# Patient Record
Sex: Male | Born: 1955
Health system: Southern US, Community
[De-identification: ages and names within clinical notes are randomized; demographics above are authoritative.]

## PROBLEM LIST (undated history)

## (undated) DIAGNOSIS — E785 Hyperlipidemia, unspecified: Secondary | ICD-10-CM

## (undated) DIAGNOSIS — J189 Pneumonia, unspecified organism: Secondary | ICD-10-CM

## (undated) DIAGNOSIS — G4730 Sleep apnea, unspecified: Secondary | ICD-10-CM

## (undated) DIAGNOSIS — F32A Depression, unspecified: Secondary | ICD-10-CM

## (undated) DIAGNOSIS — L4050 Arthropathic psoriasis, unspecified: Secondary | ICD-10-CM

## (undated) DIAGNOSIS — I10 Essential (primary) hypertension: Secondary | ICD-10-CM

## (undated) DIAGNOSIS — Z87442 Personal history of urinary calculi: Secondary | ICD-10-CM

## (undated) DIAGNOSIS — R413 Other amnesia: Secondary | ICD-10-CM

## (undated) DIAGNOSIS — F329 Major depressive disorder, single episode, unspecified: Secondary | ICD-10-CM

## (undated) HISTORY — DX: Major depressive disorder, single episode, unspecified: F32.9

## (undated) HISTORY — DX: Depression, unspecified: F32.A

## (undated) HISTORY — PX: SPINAL CORD STIMULATOR IMPLANT: SHX2422

## (undated) HISTORY — DX: Other amnesia: R41.3

## (undated) HISTORY — PX: TONSILLECTOMY: SUR1361

---

## 2006-02-28 ENCOUNTER — Ambulatory Visit (HOSPITAL_COMMUNITY): Admission: RE | Admit: 2006-02-28 | Discharge: 2006-02-28 | Payer: Self-pay | Admitting: *Deleted

## 2012-02-19 ENCOUNTER — Ambulatory Visit (INDEPENDENT_AMBULATORY_CARE_PROVIDER_SITE_OTHER): Payer: 59 | Admitting: Family Medicine

## 2012-02-19 VITALS — BP 130/88 | HR 70 | Temp 97.9°F | Resp 20 | Ht 71.0 in | Wt 258.0 lb

## 2012-02-19 DIAGNOSIS — H8110 Benign paroxysmal vertigo, unspecified ear: Secondary | ICD-10-CM

## 2012-02-19 MED ORDER — PROMETHAZINE HCL 25 MG PO TABS: 25.0000 mg | ORAL_TABLET | Freq: Three times a day (TID) | ORAL | Status: DC | PRN

## 2012-02-19 MED ORDER — PROMETHAZINE HCL 25 MG/ML IJ SOLN
25.0000 mg | Freq: Four times a day (QID) | INTRAMUSCULAR | Status: DC | PRN
  Administered 2012-02-19: 25 mg via INTRAMUSCULAR

## 2012-02-19 MED ORDER — MECLIZINE HCL 25 MG PO TABS: 25.0000 mg | ORAL_TABLET | Freq: Three times a day (TID) | ORAL | Status: DC | PRN

## 2012-02-19 NOTE — Progress Notes (Signed)
  Subjective:    Patient ID: John [name], male    DOB: 02-Jul-1955, 56 y.o.   MRN: [id number]  HPI  Happened to him about 5 yrs ago and treated immed w/ some medication but doesn't remember anything else about diagnosis or trx.  For the past 1-2 wks, he has felt slightly dizzy/off-balance in the a.m.  Turned over this a.m. and room was shaking back and forth very quickly, went away after several minutes of setting up.  Gets freq motion sickness very easily even with just watching tv so this is making him feel very ill and very nauseated.  Past Medical History  Diagnosis Date  . Arthritis   . Depression      Review of Systems  Constitutional: Negative for fever and chills.  HENT: Negative for hearing loss, ear pain, congestion, rhinorrhea, neck pain, neck stiffness, sinus pressure, tinnitus and ear discharge.   Eyes: Positive for visual disturbance.  Gastrointestinal: Positive for nausea. Negative for vomiting and abdominal pain.  Skin: Negative for rash.  Neurological: Positive for dizziness and light-headedness. Negative for tremors, syncope, weakness and numbness.  Hematological: Negative for adenopathy.  Psychiatric/Behavioral: Negative for sleep disturbance.      BP 130/88  Pulse 70  Temp 97.9 F (36.6 C) (Oral)  Resp 20  Ht 5\' 11"  (1.803 m)  Wt 258 lb (117.028 kg)  BMI 35.98 kg/m2  SpO2 95% Objective:   Physical Exam  Constitutional: He is oriented to person, place, and time. He appears well-developed and well-nourished. No distress.  HENT:  Head: Normocephalic and atraumatic.  Right Ear: External ear normal.  Left Ear: External ear normal.  Nose: Nose normal.  Mouth/Throat: Oropharynx is clear and moist. No oropharyngeal exudate.  Eyes: Conjunctivae normal are normal. Pupils are equal, round, and reactive to light. Right eye exhibits no discharge. Left eye exhibits no discharge. No scleral icterus. Right eye exhibits nystagmus. Right eye exhibits normal extraocular  motion. Left eye exhibits nystagmus. Left eye exhibits normal extraocular motion.  Neck: Normal range of motion. Neck supple. No thyromegaly present.  Cardiovascular: Normal rate, regular rhythm and normal heart sounds.   Pulmonary/Chest: Effort normal and breath sounds normal. No respiratory distress.  Lymphadenopathy:    He has no cervical adenopathy.  Neurological: He is alert and oriented to person, place, and time. Gait normal.       + Dix-Hallpike bilaterally but worse on the left, triggered severe nausea while in office  Skin: Skin is warm. He is not diaphoretic.  Psychiatric: He has a normal mood and affect. His behavior is normal.          Assessment & Plan:   1. Benign paroxysmal positional vertigo  promethazine (PHENERGAN) injection 25 mg, meclizine (ANTIVERT) 25 MG tablet, promethazine (PHENERGAN) 25 MG tablet   Gave pt several handouts describing home modified-Epley manuvers - attempted to demonstrate w/ pt in office but vertigo and nausea was so severe that he couldn't tolerate it even 20-30 min after receiving IM promethazine 25mg . He will try maneuvers at home after he gets meclizine on board.

## 2012-02-19 NOTE — Patient Instructions (Addendum)
Benign Positional Vertigo  Vertigo means you feel like you or your surroundings are moving when they are not. Benign positional vertigo is the most common form of vertigo. Benign means that the cause of your condition is not serious. Benign positional vertigo is more common in older adults.  CAUSES   Benign positional vertigo is the result of an upset in the labyrinth system. This is an area in the middle ear that helps control your balance. This may be caused by a viral infection, head injury, or repetitive motion. However, often no specific cause is found.  SYMPTOMS   Symptoms of benign positional vertigo occur when you move your head or eyes in different directions. Some of the symptoms may include:  · Loss of balance and falls.  · Vomiting.  · Blurred vision.  · Dizziness.  · Nausea.  · Involuntary eye movements (nystagmus).  DIAGNOSIS   Benign positional vertigo is usually diagnosed by physical exam. If the specific cause of your benign positional vertigo is unknown, your caregiver may perform imaging tests, such as magnetic resonance imaging (MRI) or computed tomography (CT).  TREATMENT   Your caregiver may recommend movements or procedures to correct the benign positional vertigo. Medicines such as meclizine, benzodiazepines, and medicines for nausea may be used to treat your symptoms. In rare cases, if your symptoms are caused by certain conditions that affect the inner ear, you may need surgery.  HOME CARE INSTRUCTIONS   · Follow your caregiver's instructions.  · Move slowly. Do not make sudden body or head movements.  · Avoid driving.  · Avoid operating heavy machinery.  · Avoid performing any tasks that would be dangerous to you or others during a vertigo episode.  · Drink enough fluids to keep your urine clear or pale yellow.  SEEK IMMEDIATE MEDICAL CARE IF:   · You develop problems with walking, weakness, numbness, or using your arms, hands, or legs.  · You have difficulty speaking.  · You develop  severe headaches.  · Your nausea or vomiting continues or gets worse.  · You develop visual changes.  · Your family or friends notice any behavioral changes.  · Your condition gets worse.  · You have a fever.  · You develop a stiff neck or sensitivity to light.  MAKE SURE YOU:   · Understand these instructions.  · Will watch your condition.  · Will get help right away if you are not doing well or get worse.  Document Released: 01/04/2006 Document Revised: 06/21/2011 Document Reviewed: 12/17/2010  [employer]® Patient Information ©2013 [employer].

## 2014-06-03 ENCOUNTER — Other Ambulatory Visit: Payer: Self-pay | Admitting: Orthopedic Surgery

## 2014-06-03 DIAGNOSIS — M545 Low back pain: Secondary | ICD-10-CM

## 2014-06-19 ENCOUNTER — Ambulatory Visit
Admission: RE | Admit: 2014-06-19 | Discharge: 2014-06-19 | Disposition: A | Discharge status: Home / Self Care | Payer: 59 | Source: Ambulatory Visit | Attending: Orthopedic Surgery | Admitting: Orthopedic Surgery

## 2014-06-19 DIAGNOSIS — M545 Low back pain: Secondary | ICD-10-CM

## 2016-03-23 ENCOUNTER — Ambulatory Visit (HOSPITAL_COMMUNITY)
Admission: EM | Admit: 2016-03-23 | Discharge: 2016-03-23 | Disposition: A | Discharge status: Home / Self Care | Payer: PRIVATE HEALTH INSURANCE | Attending: Family Medicine | Admitting: Family Medicine

## 2016-03-23 ENCOUNTER — Encounter (HOSPITAL_COMMUNITY): Payer: Self-pay | Admitting: Emergency Medicine

## 2016-03-23 ENCOUNTER — Ambulatory Visit (HOSPITAL_COMMUNITY): Payer: Self-pay

## 2016-03-23 ENCOUNTER — Ambulatory Visit (INDEPENDENT_AMBULATORY_CARE_PROVIDER_SITE_OTHER): Payer: PRIVATE HEALTH INSURANCE

## 2016-03-23 DIAGNOSIS — T148XXA Other injury of unspecified body region, initial encounter: Secondary | ICD-10-CM | POA: Diagnosis not present

## 2016-03-23 DIAGNOSIS — Z23 Encounter for immunization: Secondary | ICD-10-CM

## 2016-03-23 DIAGNOSIS — M791 Myalgia: Secondary | ICD-10-CM | POA: Diagnosis not present

## 2016-03-23 DIAGNOSIS — M7918 Myalgia, other site: Secondary | ICD-10-CM

## 2016-03-23 DIAGNOSIS — M25512 Pain in left shoulder: Secondary | ICD-10-CM

## 2016-03-23 HISTORY — DX: Essential (primary) hypertension: I10

## 2016-03-23 HISTORY — DX: Hyperlipidemia, unspecified: E78.5

## 2016-03-23 MED ORDER — DICLOFENAC POTASSIUM 50 MG PO TABS: 50.0000 mg | ORAL_TABLET | Freq: Three times a day (TID) | ORAL | 0 refills | Status: DC

## 2016-03-23 MED ORDER — TETANUS-DIPHTH-ACELL PERTUSSIS 5-2.5-18.5 LF-MCG/0.5 IM SUSP
0.5000 mL | Freq: Once | INTRAMUSCULAR | Status: AC
  Administered 2016-03-23: 0.5 mL via INTRAMUSCULAR

## 2016-03-23 MED ORDER — TETANUS-DIPHTH-ACELL PERTUSSIS 5-2.5-18.5 LF-MCG/0.5 IM SUSP
INTRAMUSCULAR | Status: AC
  Filled 2016-03-23: qty 0.5
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
## 2016-03-23 NOTE — Discharge Instructions (Signed)
Use ice to the shoulder for the next couple days. You may develop soreness and muscles all along her neck and back or other areas in the next 24 hours. Start off with using ice for a day and then switch to heat. Wear the arm sling for the next couple of days but you may remove it periodically to move the shoulder around does not get too stiff. If you continue to have shoulder pain or worsening of shoulder pain after 3 days follow-up with your primary care provider or orthopedist. If you develop problems with vision, speech, hearing, unusual sleepiness, double vision. Problems with walking or balance or coordination, ability to think clearly or memory problems go directly to the emergency department. Keep the abrasions clean with soap and water. Watch for any signs of infection.
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
## 2016-04-10 ENCOUNTER — Encounter (HOSPITAL_COMMUNITY): Payer: Self-pay | Admitting: *Deleted

## 2016-04-10 ENCOUNTER — Ambulatory Visit (INDEPENDENT_AMBULATORY_CARE_PROVIDER_SITE_OTHER): Payer: 59

## 2016-04-10 ENCOUNTER — Ambulatory Visit (HOSPITAL_COMMUNITY)
Admission: EM | Admit: 2016-04-10 | Discharge: 2016-04-10 | Disposition: A | Discharge status: Home / Self Care | Payer: 59 | Attending: Emergency Medicine | Admitting: Emergency Medicine

## 2016-04-10 DIAGNOSIS — R059 Cough, unspecified: Secondary | ICD-10-CM

## 2016-04-10 DIAGNOSIS — R0789 Other chest pain: Secondary | ICD-10-CM | POA: Diagnosis not present

## 2016-04-10 DIAGNOSIS — R05 Cough: Secondary | ICD-10-CM | POA: Diagnosis not present

## 2016-04-10 DIAGNOSIS — R938 Abnormal findings on diagnostic imaging of other specified body structures: Secondary | ICD-10-CM | POA: Diagnosis not present

## 2016-04-10 DIAGNOSIS — R9389 Abnormal findings on diagnostic imaging of other specified body structures: Secondary | ICD-10-CM

## 2016-04-10 MED ORDER — CEFDINIR 300 MG PO CAPS: 300.0000 mg | ORAL_CAPSULE | Freq: Two times a day (BID) | ORAL | 0 refills | Status: DC

## 2016-04-10 MED ORDER — ALBUTEROL SULFATE HFA 108 (90 BASE) MCG/ACT IN AERS: 2.0000 | INHALATION_SPRAY | RESPIRATORY_TRACT | 0 refills | Status: DC | PRN

## 2016-04-10 MED ORDER — AZITHROMYCIN 250 MG PO TABS: ORAL_TABLET | ORAL | 0 refills | Status: DC

## 2016-04-10 MED ORDER — PREDNISONE 50 MG PO TABS: ORAL_TABLET | ORAL | 0 refills | Status: DC

## 2016-04-10 NOTE — ED Triage Notes (Signed)
Pt  Reports  Pain  r  Side       And  Back  Front      Feels like    Cannot  Get  A  Deep  Breath       Feels   Short  Of  Breath   This   Am     Pt  Reports    A  Lingering      Cold     As   Well    X  3   Weeks

## 2016-04-10 NOTE — Discharge Instructions (Signed)
Take the antibiotics as directed. Use the albuterol inhaler for cough and cough spasms as directed. If you start to get worse such as increasing your fever, worsening shortness of breath, worsening chest pain or developing new symptoms go to the emergency department promptly. Take deep breaths 3-4 of them every hour and use the first lip breathing. 3 -4 times an hour as well. Drink plenty fluids and stay well-hydrated.

## 2016-04-10 NOTE — ED Provider Notes (Signed)
CSN: [id number]     Arrival date & time 04/10/16  1217 History   First MD Initiated Contact with Patient 04/10/16 1412     Chief Complaint  Patient presents with  . Cough   (Consider location/radiation/quality/duration/timing/severity/associated sxs/prior Treatment) 60 year old male states that he has had a cough for several weeks. Presently one half weeks ago he called his PCP and was given a prescription for [employer]. He states that they helped for short period of time but now he is worse. He develop shortness of breath this morning and last evening pain to the right chest, anterior and posterior. This pain is worse when taking a deep breath and movement. Denies any known fevers. Current temperature 100.4 degrees. Also denies trauma to the chest. He has a history of smoking and quit in 1992.      Past Medical History:  Diagnosis Date  . Arthritis   . Depression   . Hyperlipemia   . Hypertension    Past Surgical History:  Procedure Laterality Date  . TONSILLECTOMY     History reviewed. No pertinent family history. Social History  Substance Use Topics  . Smoking status: Former Smoker    Quit date: 03/24/1991  . Smokeless tobacco: Never Used  . Alcohol use 0.5 oz/week    1 Standard drinks or equivalent per week    Review of Systems  Constitutional: Positive for activity change. Negative for fever.  HENT: Negative.   Eyes: Negative.   Respiratory: Positive for cough and shortness of breath.   Cardiovascular: Positive for chest pain. Negative for palpitations and leg swelling.  Gastrointestinal: Negative.   Skin: Negative.   Neurological: Negative.   All other systems reviewed and are negative.   Allergies  Penicillins  Home Medications   Prior to Admission medications   Medication Sig Start Date End Date Taking? Authorizing Provider  Benzonatate (TESSALON PERLES PO) Take by mouth.   Yes Historical Provider, MD  methotrexate (50 MG/ML) 1 g injection Inject  into the vein once.   Yes Historical Provider, MD  albuterol (PROVENTIL HFA;VENTOLIN HFA) 108 (90 Base) MCG/ACT inhaler Inhale 2 puffs into the lungs every 4 (four) hours as needed for wheezing or shortness of breath. 04/10/16   Hayden Rasmussen, NP  azithromycin (ZITHROMAX) 250 MG tablet 2 tabs po on day one, then one tablet po once daily on days 2-5. 04/10/16   Hayden Rasmussen, NP  buPROPion (WELLBUTRIN XL) 300 MG 24 hr tablet Take 300 mg by mouth daily.    Historical Provider, MD  cefdinir (OMNICEF) 300 MG capsule Take 1 capsule (300 mg total) by mouth 2 (two) times daily. 04/10/16   Hayden Rasmussen, NP  citalopram (CELEXA) 20 MG tablet Take 20 mg by mouth daily.    Historical Provider, MD  fish oil-omega-3 fatty acids 1000 MG capsule Take 2 g by mouth daily.    Historical Provider, MD  glucosamine-chondroitin 500-400 MG tablet Take 1 tablet by mouth 3 (three) times daily.    Historical Provider, MD  l-methylfolate-B6-B12 (METANX) 3-35-2 MG TABS Take 1 tablet by mouth daily.    Historical Provider, MD  Multiple Vitamins-Minerals (MULTIVITAMIN WITH MINERALS) tablet Take 1 tablet by mouth daily.    Historical Provider, MD  predniSONE (DELTASONE) 50 MG tablet 1 tab po daily for 6 days. Take with food. 04/10/16   Hayden Rasmussen, NP   Meds Ordered and Administered this Visit  Medications - No data to display  BP 139/58 (BP Location: Left Arm)   Pulse  83   Temp 100.4 F (38 C) (Oral)   Resp 20   SpO2 96%  No data found.   Physical Exam  Constitutional: He is oriented to person, place, and time. He appears well-developed and well-nourished. No distress.  HENT:  Head: Normocephalic and atraumatic.  Mouth/Throat: Oropharynx is clear and moist. No oropharyngeal exudate.  Eyes: EOM are normal.  Neck: Normal range of motion. Neck supple.  Cardiovascular: Normal rate, regular rhythm, normal heart sounds and intact distal pulses.   Pulmonary/Chest:  Mild increase in respiratory effort. When forced to take deep  breath he has to stop before he completes full capacity due to pain in the right chest. This reduces air movement. No wheezing or crackles. No pleural friction rub heard. Positive for tenderness directly along the right back, para thoracic musculature. Some tenderness to the right anterior chest wall.  Neurological: He is alert and oriented to person, place, and time.  Skin: Skin is warm and dry.  Nursing note and vitals reviewed.   Urgent Care Course   Clinical Course   Chest x-ray read as few small areas of atelectasis otherwise without evidence of pneumonia. There is some increased density in the left lower lobe and patchy densities in the perihilar areas. With the prolonged cough, the more recent shortness of breath and low-grade fever will likely treat with antibiotics.  Procedures (including critical care time)  Labs Review Labs Reviewed - No data to display  Imaging Review Dg Chest 2 View  Result Date: 04/10/2016 CLINICAL DATA:  Cough, shortness of breath. EXAM: CHEST  2 VIEW COMPARISON:  Radiograph of Sep 04, 2014. FINDINGS: Stable cardiomediastinal silhouette. Mild bibasilar subsegmental atelectasis is noted. Hypoinflation of the lungs is noted. No pneumothorax or pleural effusion is noted. Bony thorax is unremarkable. IMPRESSION: Hypoinflation of the lungs. Mild bibasilar subsegmental atelectasis. Electronically Signed   By: Lupita RaiderJames  Green Jr, M.D.   On: 04/10/2016 15:09     Visual Acuity Review  Right Eye Distance:   Left Eye Distance:   Bilateral Distance:    Right Eye Near:   Left Eye Near:    Bilateral Near:         MDM   1. Cough   2. Chest wall pain   3. Chest x-ray abnormality    Take the antibiotics as directed. Use the albuterol inhaler for cough and cough spasms as directed. If you start to get worse such as increasing your fever, worsening shortness of breath, worsening chest pain or developing new symptoms go to the emergency department promptly. Take  deep breaths 3-4 of them every hour and use the first lip breathing. 3 -4 times an hour as well. Drink plenty fluids and stay well-hydrated. Meds ordered this encounter  Medications  . methotrexate (50 MG/ML) 1 g injection    Sig: Inject into the vein once.  . Benzonatate (TESSALON PERLES PO)    Sig: Take by mouth.  [name] Kitchen. azithromycin (ZITHROMAX) 250 MG tablet    Sig: 2 tabs po on day one, then one tablet po once daily on days 2-5.    Dispense:  6 tablet    Refill:  0    Order Specific Question:   Supervising Provider    Answer:   Charm RingsHONIG, ERIN J [id number]]  . cefdinir (OMNICEF) 300 MG capsule    Sig: Take 1 capsule (300 mg total) by mouth 2 (two) times daily.    Dispense:  14 capsule    Refill:  0  Order Specific Question:   Supervising Provider    Answer:   Charm RingsHONIG, ERIN J [[id number]]  . albuterol (PROVENTIL HFA;VENTOLIN HFA) 108 (90 Base) MCG/ACT inhaler    Sig: Inhale 2 puffs into the lungs every 4 (four) hours as needed for wheezing or shortness of breath.    Dispense:  1 Inhaler    Refill:  0    Order Specific Question:   Supervising Provider    Answer:   Charm RingsHONIG, ERIN J [id number]]  . predniSONE (DELTASONE) 50 MG tablet    Sig: 1 tab po daily for 6 days. Take with food.    Dispense:  6 tablet    Refill:  0    Order Specific Question:   Supervising Provider    Answer:   Charm RingsHONIG, ERIN J [[id number]]       Hayden Rasmussenavid Rashad Obeid, NP 04/10/16 1550

## 2016-05-11 ENCOUNTER — Encounter (HOSPITAL_COMMUNITY): Payer: Self-pay

## 2016-05-11 ENCOUNTER — Inpatient Hospital Stay (HOSPITAL_COMMUNITY)
Admission: AD | Admit: 2016-05-11 | Discharge: 2016-05-19 | DRG: 164 | Disposition: A | Discharge status: Home / Self Care | Payer: 59 | Source: Ambulatory Visit | Attending: Surgery | Admitting: Surgery

## 2016-05-11 ENCOUNTER — Inpatient Hospital Stay (HOSPITAL_COMMUNITY): Payer: 59

## 2016-05-11 DIAGNOSIS — Z880 Allergy status to penicillin: Secondary | ICD-10-CM | POA: Diagnosis not present

## 2016-05-11 DIAGNOSIS — R945 Abnormal results of liver function studies: Secondary | ICD-10-CM

## 2016-05-11 DIAGNOSIS — F329 Major depressive disorder, single episode, unspecified: Secondary | ICD-10-CM | POA: Diagnosis present

## 2016-05-11 DIAGNOSIS — Z79899 Other long term (current) drug therapy: Secondary | ICD-10-CM

## 2016-05-11 DIAGNOSIS — J90 Pleural effusion, not elsewhere classified: Secondary | ICD-10-CM | POA: Diagnosis present

## 2016-05-11 DIAGNOSIS — T8182XA Emphysema (subcutaneous) resulting from a procedure, initial encounter: Secondary | ICD-10-CM | POA: Diagnosis not present

## 2016-05-11 DIAGNOSIS — R0989 Other specified symptoms and signs involving the circulatory and respiratory systems: Secondary | ICD-10-CM

## 2016-05-11 DIAGNOSIS — J939 Pneumothorax, unspecified: Secondary | ICD-10-CM

## 2016-05-11 DIAGNOSIS — I10 Essential (primary) hypertension: Secondary | ICD-10-CM | POA: Diagnosis present

## 2016-05-11 DIAGNOSIS — N289 Disorder of kidney and ureter, unspecified: Secondary | ICD-10-CM

## 2016-05-11 DIAGNOSIS — J9811 Atelectasis: Secondary | ICD-10-CM | POA: Diagnosis not present

## 2016-05-11 DIAGNOSIS — D649 Anemia, unspecified: Secondary | ICD-10-CM | POA: Diagnosis present

## 2016-05-11 DIAGNOSIS — Z9689 Presence of other specified functional implants: Secondary | ICD-10-CM

## 2016-05-11 DIAGNOSIS — J869 Pyothorax without fistula: Principal | ICD-10-CM | POA: Diagnosis present

## 2016-05-11 DIAGNOSIS — B9689 Other specified bacterial agents as the cause of diseases classified elsewhere: Secondary | ICD-10-CM | POA: Diagnosis present

## 2016-05-11 DIAGNOSIS — D72829 Elevated white blood cell count, unspecified: Secondary | ICD-10-CM | POA: Diagnosis present

## 2016-05-11 DIAGNOSIS — L4050 Arthropathic psoriasis, unspecified: Secondary | ICD-10-CM | POA: Diagnosis present

## 2016-05-11 DIAGNOSIS — N2889 Other specified disorders of kidney and ureter: Secondary | ICD-10-CM

## 2016-05-11 DIAGNOSIS — R0600 Dyspnea, unspecified: Secondary | ICD-10-CM | POA: Diagnosis present

## 2016-05-11 DIAGNOSIS — E785 Hyperlipidemia, unspecified: Secondary | ICD-10-CM | POA: Diagnosis present

## 2016-05-11 DIAGNOSIS — Z87891 Personal history of nicotine dependence: Secondary | ICD-10-CM | POA: Diagnosis not present

## 2016-05-11 HISTORY — DX: Arthropathic psoriasis, unspecified: L40.50

## 2016-05-11 LAB — COMPREHENSIVE METABOLIC PANEL
ALK PHOS: 151 U/L — AB (ref 38–126)
ALT: 114 U/L — AB (ref 17–63)
ANION GAP: 9 (ref 5–15)
AST: 122 U/L — ABNORMAL HIGH (ref 15–41)
Albumin: 2.5 g/dL — ABNORMAL LOW (ref 3.5–5.0)
BILIRUBIN TOTAL: 0.9 mg/dL (ref 0.3–1.2)
BUN: 24 mg/dL — AB (ref 6–20)
CALCIUM: 8.2 mg/dL — AB (ref 8.9–10.3)
CO2: 26 mmol/L (ref 22–32)
CREATININE: 0.9 mg/dL (ref 0.61–1.24)
Chloride: 101 mmol/L (ref 101–111)
GFR calc Af Amer: >60 mL/min (ref >60)
GFR calc non Af Amer: >60 mL/min (ref >60)
GLUCOSE: 139 mg/dL — AB (ref 65–99)
Potassium: 4.2 mmol/L (ref 3.5–5.1)
SODIUM: 136 mmol/L (ref 135–145)
TOTAL PROTEIN: 6.9 g/dL (ref 6.5–8.1)

## 2016-05-11 LAB — CBC WITH DIFFERENTIAL/PLATELET
BASOS ABS: 0 10*3/uL (ref 0.0–0.1)
BASOS PCT: 0 %
EOS ABS: 0 10*3/uL (ref 0.0–0.7)
EOS PCT: 0 %
HEMATOCRIT: 31.3 % — AB (ref 39.0–52.0)
Hemoglobin: 9.9 g/dL — ABNORMAL LOW (ref 13.0–17.0)
Lymphocytes Relative: 5 %
Lymphs Abs: 0.9 10*3/uL (ref 0.7–4.0)
MCH: 28.6 pg (ref 26.0–34.0)
MCHC: 31.6 g/dL (ref 30.0–36.0)
MCV: 90.5 fL (ref 78.0–100.0)
MONO ABS: 1.9 10*3/uL — AB (ref 0.1–1.0)
MONOS PCT: 10 %
Neutro Abs: 16 10*3/uL — ABNORMAL HIGH (ref 1.7–7.7)
Neutrophils Relative %: 85 %
PLATELETS: 536 10*3/uL — AB (ref 150–400)
RBC: 3.46 MIL/uL — ABNORMAL LOW (ref 4.22–5.81)
RDW: 14.4 % (ref 11.5–15.5)
WBC: 18.9 10*3/uL — ABNORMAL HIGH (ref 4.0–10.5)

## 2016-05-11 LAB — TSH: TSH: 0.69 u[IU]/mL (ref 0.350–4.500)

## 2016-05-11 LAB — LACTATE DEHYDROGENASE: LDH: 174 U/L (ref 98–192)

## 2016-05-11 LAB — APTT: aPTT: 35 seconds (ref 24–36)

## 2016-05-11 LAB — PROTIME-INR
INR: 1.18
Prothrombin Time: 15 seconds (ref 11.4–15.2)

## 2016-05-11 MED ORDER — SODIUM CHLORIDE 0.9 % IV SOLN
2500.0000 mg | INTRAVENOUS | Status: AC
  Administered 2016-05-11: 2500 mg via INTRAVENOUS
  Filled 2016-05-11: qty 500

## 2016-05-11 MED ORDER — IOPAMIDOL (ISOVUE-300) INJECTION 61%
75.0000 mL | Freq: Once | INTRAVENOUS | Status: AC | PRN
  Administered 2016-05-11: 75 mL via INTRAVENOUS

## 2016-05-11 MED ORDER — ENOXAPARIN SODIUM 40 MG/0.4ML ~~LOC~~ SOLN: 40.0000 mg | SUBCUTANEOUS | Status: DC

## 2016-05-11 MED ORDER — SODIUM CHLORIDE 0.9 % IV SOLN
1.0000 g | Freq: Three times a day (TID) | INTRAVENOUS | Status: DC
  Administered 2016-05-11 – 2016-05-13 (×5): 1 g via INTRAVENOUS
  Filled 2016-05-11 (×9): qty 1

## 2016-05-11 MED ORDER — SODIUM CHLORIDE 0.9 % IJ SOLN
INTRAMUSCULAR | Status: AC
  Administered 2016-05-11: 18:00:00
  Filled 2016-05-11: qty 50

## 2016-05-11 MED ORDER — ENOXAPARIN SODIUM 60 MG/0.6ML ~~LOC~~ SOLN: 55.0000 mg | SUBCUTANEOUS | Status: DC

## 2016-05-11 MED ORDER — VANCOMYCIN HCL IN DEXTROSE 750-5 MG/150ML-% IV SOLN
750.0000 mg | Freq: Two times a day (BID) | INTRAVENOUS | Status: DC
  Administered 2016-05-12 – 2016-05-13 (×2): 750 mg via INTRAVENOUS
  Filled 2016-05-11 (×5): qty 150

## 2016-05-11 MED ORDER — ACETAMINOPHEN 325 MG PO TABS
650.0000 mg | ORAL_TABLET | Freq: Four times a day (QID) | ORAL | Status: DC | PRN
  Administered 2016-05-11: 650 mg via ORAL
  Filled 2016-05-11: qty 2

## 2016-05-11 MED ORDER — IOPAMIDOL (ISOVUE-300) INJECTION 61%
INTRAVENOUS | Status: AC
  Administered 2016-05-11: 18:00:00
  Filled 2016-05-11: qty 75

## 2016-05-11 MED ORDER — SODIUM CHLORIDE 0.9 % IV SOLN
INTRAVENOUS | Status: AC
  Administered 2016-05-11: via INTRAVENOUS

## 2016-05-11 MED ORDER — LEVOFLOXACIN IN D5W 750 MG/150ML IV SOLN
750.0000 mg | INTRAVENOUS | Status: DC
  Administered 2016-05-11 – 2016-05-12 (×2): 750 mg via INTRAVENOUS
  Filled 2016-05-11 (×3): qty 150

## 2016-05-11 MED ORDER — ACETAMINOPHEN 650 MG RE SUPP: 650.0000 mg | Freq: Four times a day (QID) | RECTAL | Status: DC | PRN

## 2016-05-11 NOTE — Progress Notes (Signed)
Pharmacy Antibiotic Note  John [name] is a 61 y.o. male admitted on 05/11/2016 with pleural effusion with empyema.  Pharmacy has been consulted for Vancomycin, Meropenem and Levofloxacin dosing.  Plan:  Vancomycin 2500mg  IV x 1 loading dose followed by 750mg  IV q12h  Vancomycin trough goal: 15-20 mcg/ml  Meropenem 1gm IV q8h  Levofloxacin 750mg  IV q24h  F/U cultures, renal function  Height: 5\' 11"  (180.3 cm) Weight: 254 lb (115.2 kg) IBW/kg (Calculated) : 75.3  Temp (24hrs), Avg:98.7 F (37.1 C), Min:98.7 F (37.1 C), Max:98.7 F (37.1 C)   Recent Labs Lab 05/11/16 1622  CREATININE 0.90    Estimated Creatinine Clearance: 109.9 mL/min (by C-G formula based on SCr of 0.9 mg/dL).   05/11/16 : CrCl (n) = 86 ml/min   Allergies  Allergen Reactions  . Penicillins Rash    Has patient had a PCN reaction causing immediate rash, facial/tongue/throat swelling, SOB or lightheadedness with hypotension: No Has patient had a PCN reaction causing severe rash involving mucus membranes or skin necrosis: No Has patient had a PCN reaction that required hospitalization No Has patient had a PCN reaction occurring within the last 10 years: No If all of the above answers are "NO", then may proceed with Cephalosporin use.    Antimicrobials this admission: 1/30 Vanc >>   1/30 Meropenem >>   1/30 Levofloxacin >>  Dose adjustments this admission:    Microbiology results: 1/30 BCx: sent 1/30 Sputum: sent   Thank you for allowing pharmacy to be a part of this patient's care.  Maryellen PilePoindexter, Tammela Bales Trefz, PharmD 05/11/2016 6:42 PM

## 2016-05-11 NOTE — H&P (Signed)
H&P   Patient Demographics:    John [name], is a 61 y.o. male  MRN: [id number]   DOB - Sep 18, 1955  Admit Date - 05/11/2016  Outpatient Primary MD for the patient is Pearson Grippe, MD  Referring MD/NP/PA:    Outpatient Specialists:   Patient coming from:  office  No chief complaint on file.  Dyspnea   HPI:    John [name]  is a 61 y.o. male,  w psoriasis c/o cough since December 2017,  Pt was seen in ER and tx with omnicef and zithromax.  Pt then continued to have cough and seen in office, and tx with.   Pt presented today due to worsening dyspnea and was found to have right sided pleural effusion on CXR.  Pt sent to hospital for direct admission.  Pt has not taken methotrexate for the past 2 weeks due to feeling poorly.     On Arrival to [facility] had CT chest which shows a loculated pleural effusion.  Pt has been afebrile,  CT scan also showed possible right renal mass incompletely visualized on CT chest.  Pt will be admitted for dyspnea from pleural effusion  ? Empyema.  Ultrasound guided thoracentesis has been ordered.  Discussed case with pulmonary who recommended IV abx.  Will request CT surgery input as well.      Review of systems:    In addition to the HPI above, No Fever-chills, No Headache, No changes with Vision or hearing, No problems swallowing food or Liquids, Slight chest pain right sided  No Abdominal pain, No Nausea or Vommitting, Bowel movements are regular, No Blood in stool or Urine, No dysuria, No new skin rashes or bruises, No new joints pains-aches,  No new weakness, tingling, numbness in any extremity, No recent weight gain or loss, No polyuria, polydypsia or polyphagia, No significant Mental Stressors.  A full 10 point Review of Systems was done, except as stated above, all other Review of Systems were negative.   With Past History of the  following :    Past Medical History:  Diagnosis Date  . Arthritis   . Depression   . Hyperlipemia   . Hypertension       Past Surgical History:  Procedure Laterality Date  . TONSILLECTOMY        Social History:     Social History  Substance Use Topics  . Smoking status: Former Smoker    Quit date: 03/24/1991  . Smokeless tobacco: Never Used  . Alcohol use 0.5 oz/week    1 Standard drinks or equivalent per week     Lives - at home  Mobility - walks by self    Family History :    History reviewed. No pertinent family history.    Home Medications:   Prior to Admission medications   Medication Sig Start Date End Date Taking? Authorizing Provider  albuterol (  PROVENTIL HFA;VENTOLIN HFA) 108 (90 Base) MCG/ACT inhaler Inhale 2 puffs into the lungs every 4 (four) hours as needed for wheezing or shortness of breath. 04/10/16  Yes Hayden Rasmussen, NP  atomoxetine (STRATTERA) 100 MG capsule Take 100 mg by mouth daily.   Yes Historical Provider, MD  benzonatate (TESSALON) 100 MG capsule Take 100 mg by mouth 3 (three) times daily as needed for cough.   Yes Historical Provider, MD  buPROPion (WELLBUTRIN XL) 300 MG 24 hr tablet Take 300 mg by mouth daily.   Yes Historical Provider, MD  carvedilol (COREG) 3.125 MG tablet Take 3.125 mg by mouth 2 (two) times daily with a meal.   Yes Historical Provider, MD  fish oil-omega-3 fatty acids 1000 MG capsule Take 2 g by mouth daily.   Yes Historical Provider, MD  FLUoxetine (PROZAC) 20 MG tablet Take 60 mg by mouth daily.   Yes Historical Provider, MD  glucosamine-chondroitin 500-400 MG tablet Take 1 tablet by mouth 3 (three) times daily.   Yes Historical Provider, MD  l-methylfolate-B6-B12 (METANX) 3-35-2 MG TABS Take 1 tablet by mouth daily.   Yes Historical Provider, MD  losartan (COZAAR) 25 MG tablet Take 25 mg by mouth daily.   Yes Historical Provider, MD  methotrexate (50 MG/ML) 1 g injection Inject into the vein once a week.    Yes  Historical Provider, MD  Multiple Vitamins-Minerals (MULTIVITAMIN WITH MINERALS) tablet Take 1 tablet by mouth daily.   Yes Historical Provider, MD  azithromycin (ZITHROMAX) 250 MG tablet 2 tabs po on day one, then one tablet po once daily on days 2-5. Patient not taking: Reported on 05/11/2016 04/10/16   Hayden Rasmussen, NP  cefdinir (OMNICEF) 300 MG capsule Take 1 capsule (300 mg total) by mouth 2 (two) times daily. Patient not taking: Reported on 05/11/2016 04/10/16   Hayden Rasmussen, NP  predniSONE (DELTASONE) 50 MG tablet 1 tab po daily for 6 days. Take with food. Patient not taking: Reported on 05/11/2016 04/10/16   Hayden Rasmussen, NP     Allergies:     Allergies  Allergen Reactions  . Penicillins Rash    Has patient had a PCN reaction causing immediate rash, facial/tongue/throat swelling, SOB or lightheadedness with hypotension: No Has patient had a PCN reaction causing severe rash involving mucus membranes or skin necrosis: No Has patient had a PCN reaction that required hospitalization No Has patient had a PCN reaction occurring within the last 10 years: No If all of the above answers are "NO", then may proceed with Cephalosporin use.     Physical Exam:   Vitals  Blood pressure 117/69, pulse 88, temperature 98.7 F (37.1 C), temperature source Oral, resp. rate 18, height 5\' 11"  (1.803 m), weight 115.2 kg (254 lb), SpO2 97 %.   1. General lying in bed in NAD,    2. Normal affect and insight, Not Suicidal or Homicidal, Awake Alert, Oriented X 3.  3. No F.N deficits, ALL C.Nerves Intact, Strength 5/5 all 4 extremities, Sensation intact all 4 extremities, Plantars down going.  4. Ears and Eyes appear Normal, Conjunctivae clear, PERRLA. Moist Oral Mucosa.  5. Supple Neck, No JVD, No cervical lymphadenopathy appriciated, No Carotid Bruits.  6. Symmetrical Chest wall movement, Good air movement bilaterally, decreased bs about 1/3 up the right side, dullness to percussion.   7. RRR, No  Gallops, Rubs or Murmurs, No Parasternal Heave.  8. Positive Bowel Sounds, Abdomen Soft, No tenderness, No organomegaly appriciated,No rebound -guarding or rigidity.  9.  No Cyanosis, Normal Skin Turgor, No Skin Rash or Bruise.  10. Good muscle tone,  joints appear normal , no effusions, Normal ROM.  11. No Palpable Lymph Nodes in Neck or Axillae     Data Review:    CBC No results for input(s): WBC, HGB, HCT, PLT, MCV, MCH, MCHC, RDW, LYMPHSABS, MONOABS, EOSABS, BASOSABS, BANDABS in the last 168 hours.  Invalid input(s): NEUTRABS, BANDSABD ------------------------------------------------------------------------------------------------------------------  Chemistries   Recent Labs Lab 05/11/16 1622  NA 136  K 4.2  CL 101  CO2 26  GLUCOSE 139*  BUN 24*  CREATININE 0.90  CALCIUM 8.2*  AST 122*  ALT 114*  ALKPHOS 151*  BILITOT 0.9   ------------------------------------------------------------------------------------------------------------------ estimated creatinine clearance is 109.9 mL/min (by C-G formula based on SCr of 0.9 mg/dL). ------------------------------------------------------------------------------------------------------------------  Recent Labs  05/11/16 1622  TSH 0.690    Coagulation profile  Recent Labs Lab 05/11/16 1622  INR 1.18   ------------------------------------------------------------------------------------------------------------------- No results for input(s): DDIMER in the last 72 hours. -------------------------------------------------------------------------------------------------------------------  Cardiac Enzymes No results for input(s): CKMB, TROPONINI, MYOGLOBIN in the last 168 hours.  Invalid input(s): CK ------------------------------------------------------------------------------------------------------------------ No results found for:  BNP   ---------------------------------------------------------------------------------------------------------------  Urinalysis No results found for: COLORURINE, APPEARANCEUR, LABSPEC, PHURINE, GLUCOSEU, HGBUR, BILIRUBINUR, KETONESUR, PROTEINUR, UROBILINOGEN, NITRITE, LEUKOCYTESUR  ----------------------------------------------------------------------------------------------------------------   Imaging Results:    Ct Chest W Contrast  Result Date: 05/11/2016 CLINICAL DATA:  Extensive right pleural effusion EXAM: CT CHEST WITH CONTRAST TECHNIQUE: Multidetector CT imaging of the chest was performed during intravenous contrast administration. CONTRAST:  75mL ISOVUE-300 IOPAMIDOL (ISOVUE-300) INJECTION 61% COMPARISON:  Chest x-ray from earlier in the same day, 04/26/2016 FINDINGS: Cardiovascular: Thoracic aorta demonstrates some calcific changes without aneurysmal dilatation or dissection. Pulmonary artery shows no large central filling defects although timing for emboli it was not performed. Cardiac structures are within normal limits. Mediastinum/Nodes: The thoracic inlet shows no acute abnormality. Scattered small lymph nodes are noted throughout the mediastinum likely of reactive nature. No sizable lymph nodes are seen. Lungs/Pleura: The left lung is within normal limits. The right hemithorax demonstrates evidence of a large loculated pleural effusion within air-fluid level most consistent with empyema. Some associated right lower lobe and right middle lobe atelectatic changes are seen. No parenchymal nodules are noted. Upper Abdomen: Visualized upper abdomen shows a masslike density measuring 4.8 cm in the upper pole of the left kidney this is incompletely evaluated on this exam. No other focal abnormality in the upper abdomen is seen. Musculoskeletal: Bony structures show degenerative change of the thoracic spine. No acute bony abnormality is noted. IMPRESSION: Loculated pleural fluid collection  with air-fluid level consistent with an empyema. Tube drainage is recommended. Suggestion of a right upper pole renal mass as described. This is incompletely evaluated on this exam. CT of the abdomen and pelvis with contrast material is recommended for further evaluation. These results will be called to the ordering clinician or representative by the Radiologist Assistant, and communication documented in the PACS or zVision Dashboard. Electronically Signed   By: Alcide Clever M.D.   On: 05/11/2016 17:33      Assessment & Plan:    Active Problems:   Dyspnea   Pleural effusion    1.  R pleural effusion ? Empyema Sputum culture if has sputum production Pulmonary consult obtained Ultrasound guided thoracentesis ordered w labs Cbc, cmp, pt, ptt,  blood  Culture x2 CT surgery consulted for possible chest tube vs US guided thoracentesis  Start vanco, meropenem, levaquin  2. R renal  lesion CT scan abd/ pelvis  3. Psoriasis Methotrexate on hold  4. Hypertension Cont current bp medications.   5.  Abnormal liver function Will obtain CT scan abd/pelvis Check acute hepatitis panel   DVT Prophylaxis  Lovenox - SCDs   AM Labs Ordered, also please review Full Orders  Family Communication: Admission, patients condition and plan of care including tests being ordered have been discussed with the patient  who indicate understanding and agree with the plan and Code Status.  Code Status FULL CODE  Likely DC to  home  Condition GUARDED    Consults called: pulmonary   Admission status: inpatient  Time spent in minutes :  50 minutes   Pearson GrippeJames Deardra Hinkley M.D on 05/11/2016 at 6:38 PM  Between 7am to 7pm - Pager - [phone].

## 2016-05-11 NOTE — Progress Notes (Signed)
PHARMACY - LOVENOX  Lovenox 40mg  sq q24h ordered for VTE prophylaxis and pharmacy asked to adjust dose when necessary.  Height: 71 inches Weight: 115.2  BMI = 35 CrCl = 109 ml/min  Assessment:  Adjustment of Lovenox needed when BMI > 30 to 0.5 mg/kg/q24h  Plan: Change Lovenox to 55mg  sq q24h  Terrilee FilesLeann Roshaunda Starkey, PharmD

## 2016-05-12 ENCOUNTER — Encounter (HOSPITAL_COMMUNITY): Payer: Self-pay | Admitting: Pulmonary Disease

## 2016-05-12 DIAGNOSIS — J869 Pyothorax without fistula: Principal | ICD-10-CM

## 2016-05-12 DIAGNOSIS — L4050 Arthropathic psoriasis, unspecified: Secondary | ICD-10-CM | POA: Diagnosis present

## 2016-05-12 LAB — CBC
HEMATOCRIT: 30.4 % — AB (ref 39.0–52.0)
HEMOGLOBIN: 9.7 g/dL — AB (ref 13.0–17.0)
MCH: 28.4 pg (ref 26.0–34.0)
MCHC: 31.9 g/dL (ref 30.0–36.0)
MCV: 89.1 fL (ref 78.0–100.0)
Platelets: 494 10*3/uL — ABNORMAL HIGH (ref 150–400)
RBC: 3.41 MIL/uL — AB (ref 4.22–5.81)
RDW: 14.5 % (ref 11.5–15.5)
WBC: 17.2 10*3/uL — AB (ref 4.0–10.5)

## 2016-05-12 LAB — COMPREHENSIVE METABOLIC PANEL
ALT: 124 U/L — AB (ref 17–63)
AST: 114 U/L — ABNORMAL HIGH (ref 15–41)
Albumin: 2.4 g/dL — ABNORMAL LOW (ref 3.5–5.0)
Alkaline Phosphatase: 150 U/L — ABNORMAL HIGH (ref 38–126)
Anion gap: 10 (ref 5–15)
BUN: 19 mg/dL (ref 6–20)
CHLORIDE: 104 mmol/L (ref 101–111)
CO2: 21 mmol/L — AB (ref 22–32)
Calcium: 7.9 mg/dL — ABNORMAL LOW (ref 8.9–10.3)
Creatinine, Ser: 0.72 mg/dL (ref 0.61–1.24)
Glucose, Bld: 148 mg/dL — ABNORMAL HIGH (ref 65–99)
POTASSIUM: 4.3 mmol/L (ref 3.5–5.1)
SODIUM: 135 mmol/L (ref 135–145)
Total Bilirubin: 1.1 mg/dL (ref 0.3–1.2)
Total Protein: 6.4 g/dL — ABNORMAL LOW (ref 6.5–8.1)

## 2016-05-12 MED ORDER — SODIUM CHLORIDE 0.9 % IV SOLN
INTRAVENOUS | Status: DC
  Administered 2016-05-12: 23:00:00 via INTRAVENOUS

## 2016-05-12 MED ORDER — BUPROPION HCL ER (XL) 150 MG PO TB24
300.0000 mg | ORAL_TABLET | Freq: Every day | ORAL | Status: DC
  Administered 2016-05-12: 300 mg via ORAL
  Filled 2016-05-12: qty 1

## 2016-05-12 MED ORDER — CARVEDILOL 3.125 MG PO TABS
3.1250 mg | ORAL_TABLET | Freq: Two times a day (BID) | ORAL | Status: DC
  Administered 2016-05-12 (×2): 3.125 mg via ORAL
  Filled 2016-05-12 (×2): qty 1

## 2016-05-12 MED ORDER — BENZONATATE 100 MG PO CAPS: 100.0000 mg | ORAL_CAPSULE | Freq: Three times a day (TID) | ORAL | Status: DC | PRN

## 2016-05-12 MED ORDER — ENOXAPARIN SODIUM 60 MG/0.6ML ~~LOC~~ SOLN: 50.0000 mg | SUBCUTANEOUS | Status: DC

## 2016-05-12 MED ORDER — ALBUTEROL SULFATE (2.5 MG/3ML) 0.083% IN NEBU: 2.5000 mg | INHALATION_SOLUTION | RESPIRATORY_TRACT | Status: DC | PRN

## 2016-05-12 MED ORDER — ALBUTEROL SULFATE HFA 108 (90 BASE) MCG/ACT IN AERS: 2.0000 | INHALATION_SPRAY | RESPIRATORY_TRACT | Status: DC | PRN

## 2016-05-12 MED ORDER — FLUOXETINE HCL 20 MG PO CAPS
60.0000 mg | ORAL_CAPSULE | Freq: Every day | ORAL | Status: DC
  Administered 2016-05-12: 60 mg via ORAL
  Filled 2016-05-12: qty 3

## 2016-05-12 MED ORDER — LOSARTAN POTASSIUM 25 MG PO TABS
25.0000 mg | ORAL_TABLET | Freq: Every day | ORAL | Status: DC
  Administered 2016-05-12: 25 mg via ORAL
  Filled 2016-05-12: qty 1

## 2016-05-12 MED ORDER — ATOMOXETINE HCL 60 MG PO CAPS
100.0000 mg | ORAL_CAPSULE | Freq: Every day | ORAL | Status: DC
  Administered 2016-05-12: 100 mg via ORAL
  Filled 2016-05-12 (×2): qty 1

## 2016-05-12 MED ORDER — L-METHYLFOLATE-B6-B12 3-35-2 MG PO TABS: 1.0000 | ORAL_TABLET | Freq: Every day | ORAL | Status: DC

## 2016-05-12 MED ORDER — ZOLPIDEM TARTRATE 5 MG PO TABS
5.0000 mg | ORAL_TABLET | Freq: Every evening | ORAL | Status: DC | PRN
  Administered 2016-05-12: 5 mg via ORAL
  Filled 2016-05-12: qty 1

## 2016-05-12 MED ORDER — B COMPLEX-C PO TABS
1.0000 | ORAL_TABLET | Freq: Every day | ORAL | Status: DC
  Administered 2016-05-12: 1 via ORAL
  Filled 2016-05-12: qty 1

## 2016-05-12 NOTE — Consult Note (Signed)
PCCM Consult Note  Admission date: 05/11/2016 Consult date: 05/12/2016 Referring provider: Dr. Selena Batten, [facility]  CC: Short of breath  HPI: 61 yo male with remote hx of smoking developed cough in December 2017.  He was tx with Abx as outpt and prednisone.  He also had progressive dyspnea.  He also had low grade fever.  He has been coughing up yellow sputum.  He has pleuritic pain in Rt chest.  He was found to have pleural effusion on CXR and admitted to hospital.  He has hx of psoriasis with arthritis and is on MTX.  He is from [place], but has lived in [place] for 35 yrs.  He denies hx of TB.  He denies recent travel or sick exposures.  He reports hx of PCN reaction as a child >> got a rash.  He has taken PCN derivatives subsequently w/o difficulty.  He  has a past medical history of Depression; Hyperlipemia; Hypertension; and Psoriatic arthritis (HCC).  He  has a past surgical history that includes Tonsillectomy.  His family history includes COPD in his mother; Diabetes in his father.  He  reports that he quit smoking about 25 years ago. He has never used smokeless tobacco. He reports that he drinks about 0.5 oz of alcohol per week . He reports that he does not use drugs.   Allergies  Allergen Reactions  . Penicillins Rash    Has patient had a PCN reaction causing immediate rash, facial/tongue/throat swelling, SOB or lightheadedness with hypotension: No Has patient had a PCN reaction causing severe rash involving mucus membranes or skin necrosis: No Has patient had a PCN reaction that required hospitalization No Has patient had a PCN reaction occurring within the last 10 years: No If all of the above answers are "NO", then may proceed with Cephalosporin use.    No current facility-administered medications on file prior to encounter.    Current Outpatient Prescriptions on File Prior to Encounter  Medication Sig  . albuterol (PROVENTIL HFA;VENTOLIN HFA) 108 (90 Base) MCG/ACT inhaler Inhale 2  puffs into the lungs every 4 (four) hours as needed for wheezing or shortness of breath.  [name] Kitchen buPROPion (WELLBUTRIN XL) 300 MG 24 hr tablet Take 300 mg by mouth daily.  . fish oil-omega-3 fatty acids 1000 MG capsule Take 2 g by mouth daily.  [name] Kitchen glucosamine-chondroitin 500-400 MG tablet Take 1 tablet by mouth 3 (three) times daily.  [name] Kitchen l-methylfolate-B6-B12 (METANX) 3-35-2 MG TABS Take 1 tablet by mouth daily.  . methotrexate (50 MG/ML) 1 g injection Inject into the vein once a week.   . Multiple Vitamins-Minerals (MULTIVITAMIN WITH MINERALS) tablet Take 1 tablet by mouth daily.  [name] Kitchen azithromycin (ZITHROMAX) 250 MG tablet 2 tabs po on day one, then one tablet po once daily on days 2-5. (Patient not taking: Reported on 05/11/2016)  . cefdinir (OMNICEF) 300 MG capsule Take 1 capsule (300 mg total) by mouth 2 (two) times daily. (Patient not taking: Reported on 05/11/2016)  . predniSONE (DELTASONE) 50 MG tablet 1 tab po daily for 6 days. Take with food. (Patient not taking: Reported on 05/11/2016)    ROS: 12 ROS all negative except above.  Vital signs: BP (!) 154/67 (BP Location: Left Arm)   Pulse 92   Temp (!) 100.5 F (38.1 C) (Oral)   Resp 18   Ht 5\' 11"  (1.803 m)   Wt 232 lb 6.4 oz (105.4 kg)   SpO2 94%   BMI 32.41 kg/m   Intake/output: I/O last  3 completed shifts: In: 240 [P.O.:240] Out: -   General: laying in bed Neuro: alert, CN intact Eyes: pupils reactive ENT: no sinus tenderness, no oral exudate, no LAN Cardiac: regular, no murmur Chest: decreased BS with dullness on Rt base Abd: soft, non tender, + bowel sounds Ext: no edema Skin: change of psoriasis in nail beds   CMP Latest Ref Rng & Units 05/12/2016 05/11/2016  Glucose 65 - 99 mg/dL 960(A148(H) 540(J139(H)  BUN 6 - 20 mg/dL 19 81(X24(H)  Creatinine 9.140.61 - 1.24 mg/dL 7.820.72 9.560.90  Sodium 213135 - 145 mmol/L 135 136  Potassium 3.5 - 5.1 mmol/L 4.3 4.2  Chloride 101 - 111 mmol/L 104 101  CO2 22 - 32 mmol/L 21(L) 26  Calcium 8.9 - 10.3 mg/dL  7.9(L) 8.2(L)  Total Protein 6.5 - 8.1 g/dL 6.4(L) 6.9  Total Bilirubin 0.3 - 1.2 mg/dL 1.1 0.9  Alkaline Phos 38 - 126 U/L 150(H) 151(H)  AST 15 - 41 U/L 114(H) 122(H)  ALT 17 - 63 U/L 124(H) 114(H)     CBC Latest Ref Rng & Units 05/12/2016 05/11/2016  WBC 4.0 - 10.5 K/uL 17.2(H) 18.9(H)  Hemoglobin 13.0 - 17.0 g/dL 0.8(M9.7(L) 5.7(Q9.9(L)  Hematocrit 39.0 - 52.0 % 30.4(L) 31.3(L)  Platelets 150 - 400 K/uL 494(H) 536(H)     ABG No results found for: PHART, PCO2ART, PO2ART, HCO3, TCO2, ACIDBASEDEF, O2SAT   CBG (last 3)  No results for input(s): GLUCAP in the last 72 hours.   Imaging: Ct Chest W Contrast  Result Date: 05/11/2016 CLINICAL DATA:  Extensive right pleural effusion EXAM: CT CHEST WITH CONTRAST TECHNIQUE: Multidetector CT imaging of the chest was performed during intravenous contrast administration. CONTRAST:  75mL ISOVUE-300 IOPAMIDOL (ISOVUE-300) INJECTION 61% COMPARISON:  Chest x-ray from earlier in the same day, 04/26/2016 FINDINGS: Cardiovascular: Thoracic aorta demonstrates some calcific changes without aneurysmal dilatation or dissection. Pulmonary artery shows no large central filling defects although timing for emboli it was not performed. Cardiac structures are within normal limits. Mediastinum/Nodes: The thoracic inlet shows no acute abnormality. Scattered small lymph nodes are noted throughout the mediastinum likely of reactive nature. No sizable lymph nodes are seen. Lungs/Pleura: The left lung is within normal limits. The right hemithorax demonstrates evidence of a large loculated pleural effusion within air-fluid level most consistent with empyema. Some associated right lower lobe and right middle lobe atelectatic changes are seen. No parenchymal nodules are noted. Upper Abdomen: Visualized upper abdomen shows a masslike density measuring 4.8 cm in the upper pole of the left kidney this is incompletely evaluated on this exam. No other focal abnormality in the upper abdomen is  seen. Musculoskeletal: Bony structures show degenerative change of the thoracic spine. No acute bony abnormality is noted. IMPRESSION: Loculated pleural fluid collection with air-fluid level consistent with an empyema. Tube drainage is recommended. Suggestion of a right upper pole renal mass as described. This is incompletely evaluated on this exam. CT of the abdomen and pelvis with contrast material is recommended for further evaluation. These results will be called to the ordering clinician or representative by the Radiologist Assistant, and communication documented in the PACS or zVision Dashboard. Electronically Signed   By: Alcide CleverMark  Lukens M.D.   On: 05/11/2016 17:33     Studies: CT chest 1/30 >> loculated pleural effusion on Rt with air fluid level, 4.8 cm mass upper pole Rt kidney  Cultures: Blood 1/30 >> Quantiferon Gold 1/31 >> HIV 1/31 >>   Antibiotics: Levaquin 1/30 >> Meropenem 1/30 >> Vancomycin  1/30 >.  Lines/tubes:  Events: 1/30 Admit  Summary: 61 yo male with recent febrile respiratory infection presents with persistent cough, fever, dyspnea and pleuritic chest pain.  He is found to have loculated Rt pleural effusion suggestive of empyema.  He has hx of psoriatic arthritis and is on MTX as outpt.  Assessment/plan:  Loculated Rt pleural effusion with concern for empyema. - Day 2 of Abx - check Quantiferon gold - thoracic surgery to assess, and then decide if he needs VATS vs IR to place chest tube - once fluid drained, would send for analysis (glucose, protein, LDH, cell count, cytology, culture, AFB, fungal culture, adenosine deaminase)  DVT prophylaxis - Lovenox SUP - not indicated Nutrition - NPO Goals of care - Full code  Updated pt's wife at bedside  Coralyn Helling, MD [facility] 05/12/2016, 8:10 AM Pager:  [phone] After 3pm call: [phone]

## 2016-05-12 NOTE — Progress Notes (Signed)
Patient ID: John CrumbleRichard T Vonbargen, male   DOB: 06/02/1955, 61 y.o.   MRN: [id number]                                                                PROGRESS NOTE                                                                                                                                                                                                             Patient Demographics:    John JudeRichard [name], is a 61 y.o. male, DOB - 01/07/1956, WUJ:[id number]  Admit date - 05/11/2016   Admitting Physician Pearson GrippeJames Gazelle Towe, MD  Outpatient Primary MD for the patient is Pearson GrippeJames Ashawn Rinehart, MD  LOS - 1  Outpatient Specialists:  Alben DeedsJames Beekman (rheumatologist)  No chief complaint on file.    dyspnea  Brief Narrative  61 y.o. male,  w psoriasis c/o cough since December 2017,  Pt was seen in ER and tx with omnicef and zithromax.  Pt then continued to have cough and seen in office, and tx with.   Pt presented today due to worsening dyspnea and was found to have right sided pleural effusion on CXR.  Pt sent to hospital for direct admission.  Pt has not taken methotrexate for the past 2 weeks due to feeling poorly.     [facility] chest which shows a loculated pleural effusion.  Pt has been afebrile,  CT scan also showed possible right renal mass incompletely visualized on CT chest.  Pt will be admitted for dyspnea from pleural effusion  ? Empyema.  Ultrasound guided thoracentesis has been ordered.  Discussed case with pulmonary who recommended IV abx.     Subjective:    John Judeichard Lill spiked fever after admission, slight cough this am.  Pt denies any change in breathing.  Denies cp, palp, n/v, diarrhea.     Assessment  & Plan :    Active Problems:   Dyspnea   Pleural effusion   1.  R pleural effusion ? Empyema Sputum culture if has sputum production Pulmonary consult obtained CT surgery consulted CT guided thoracentesis ordered but defer CT surgery what type of intervention needed.  Cont vanco,  meropenem, levaquin  2. R renal lesion CT scan abd/ pelvis ordered, still pending  3.  Psoriasis Methotrexate on hold  4. Hypertension Cont current bp medications.   5.  Abnormal liver function CT scan abd/pelvis pending Check acute hepatitis panel Repeat cmp in am  6. Anemia Repeat cbc in am   Code Status : FULL CODE  Family Communication  : w patient and wife  Disposition Plan  : home  Barriers For Discharge :   Consults  :  pulmonary  Procedures  :    DVT Prophylaxis  :  Lovenox -SCDs   Lab Results  Component Value Date   PLT 494 (H) 05/12/2016    Antibiotics  :   Vanco 1/30=> Meropenem 1/30=> Levaquin 1/30=>  Anti-infectives    Start     Dose/Rate Route Frequency Ordered Stop   05/12/16 0800  vancomycin (VANCOCIN) IVPB 750 mg/150 ml premix     750 mg 150 mL/hr over 60 Minutes Intravenous Every 12 hours 05/11/16 1838     05/11/16 2000  meropenem (MERREM) 1 g in sodium chloride 0.9 % 100 mL IVPB     1 g 200 mL/hr over 30 Minutes Intravenous Every 8 hours 05/11/16 1837     05/11/16 1900  vancomycin (VANCOCIN) 2,500 mg in sodium chloride 0.9 % 500 mL IVPB     2,500 mg 250 mL/hr over 120 Minutes Intravenous STAT 05/11/16 1836 05/11/16 2233   05/11/16 1900  levofloxacin (LEVAQUIN) IVPB 750 mg     750 mg 100 mL/hr over 90 Minutes Intravenous Every 24 hours 05/11/16 1836          Objective:   Vitals:   05/11/16 2114 05/11/16 2144 05/12/16 0427 05/12/16 0442  BP: (!) 147/61  (!) 154/67   Pulse: 87  92   Resp: 18  18   Temp: (!) 102.1 F (38.9 C) (!) 100.4 F (38 C) (!) 100.5 F (38.1 C)   TempSrc: Oral Oral Oral   SpO2: 99%  94%   Weight:    105.4 kg (232 lb 6.4 oz)  Height:        Wt Readings from Last 3 Encounters:  05/12/16 105.4 kg (232 lb 6.4 oz)  06/19/14 117.9 kg (260 lb)  02/19/12 117 kg (258 lb)     Intake/Output Summary (Last 24 hours) at 05/12/16 0658 Last data filed at 05/11/16 2144  Gross per 24 hour  Intake               240 ml  Output                0 ml  Net              240 ml     Physical Exam  Awake Alert, Oriented X 3, No new F.N deficits, Normal affect [place].AT,PERRAL Supple Neck,No JVD, No cervical lymphadenopathy appriciated.  RRR, S1, S2, no m/g/r Decrease in bs 1/3 up right side, and dullness to percussion.   +ve B.Sounds, Abd Soft, No tenderness, No organomegaly appriciated, No rebound - guarding or rigidity. No Cyanosis, Clubbing or edema, No new Rash or bruise      Data Review:    CBC  Recent Labs Lab 05/11/16 1622 05/12/16 0413  WBC 18.9* 17.2*  HGB 9.9* 9.7*  HCT 31.3* 30.4*  PLT 536* 494*  MCV 90.5 89.1  MCH 28.6 28.4  MCHC 31.6 31.9  RDW 14.4 14.5  LYMPHSABS 0.9  --   MONOABS 1.9*  --   EOSABS 0.0  --   BASOSABS 0.0  --  Chemistries   Recent Labs Lab 05/11/16 1622 05/12/16 0413  NA 136 135  K 4.2 4.3  CL 101 104  CO2 26 21*  GLUCOSE 139* 148*  BUN 24* 19  CREATININE 0.90 0.72  CALCIUM 8.2* 7.9*  AST 122* 114*  ALT 114* 124*  ALKPHOS 151* 150*  BILITOT 0.9 1.1   ------------------------------------------------------------------------------------------------------------------ No results for input(s): CHOL, HDL, LDLCALC, TRIG, CHOLHDL, LDLDIRECT in the last 72 hours.  No results found for: HGBA1C ------------------------------------------------------------------------------------------------------------------  Recent Labs  05/11/16 1622  TSH 0.690   ------------------------------------------------------------------------------------------------------------------ No results for input(s): VITAMINB12, FOLATE, FERRITIN, TIBC, IRON, RETICCTPCT in the last 72 hours.  Coagulation profile  Recent Labs Lab 05/11/16 1622  INR 1.18    No results for input(s): DDIMER in the last 72 hours.  Cardiac Enzymes No results for input(s): CKMB, TROPONINI, MYOGLOBIN in the last 168 hours.  Invalid input(s):  CK ------------------------------------------------------------------------------------------------------------------ No results found for: BNP  Inpatient Medications  Scheduled Meds: . enoxaparin (LOVENOX) injection  55 mg Subcutaneous Q24H  . levofloxacin (LEVAQUIN) IV  750 mg Intravenous Q24H  . meropenem (MERREM) IV  1 g Intravenous Q8H  . vancomycin  750 mg Intravenous Q12H   Continuous Infusions: . sodium chloride 75 mL/hr at 05/11/16 2337   PRN Meds:.acetaminophen **OR** acetaminophen  Micro Results No results found for this or any previous visit (from the past 240 hour(s)).  Radiology Reports Ct Chest W Contrast  Result Date: 05/11/2016 CLINICAL DATA:  Extensive right pleural effusion EXAM: CT CHEST WITH CONTRAST TECHNIQUE: Multidetector CT imaging of the chest was performed during intravenous contrast administration. CONTRAST:  75mL ISOVUE-300 IOPAMIDOL (ISOVUE-300) INJECTION 61% COMPARISON:  Chest x-ray from earlier in the same day, 04/26/2016 FINDINGS: Cardiovascular: Thoracic aorta demonstrates some calcific changes without aneurysmal dilatation or dissection. Pulmonary artery shows no large central filling defects although timing for emboli it was not performed. Cardiac structures are within normal limits. Mediastinum/Nodes: The thoracic inlet shows no acute abnormality. Scattered small lymph nodes are noted throughout the mediastinum likely of reactive nature. No sizable lymph nodes are seen. Lungs/Pleura: The left lung is within normal limits. The right hemithorax demonstrates evidence of a large loculated pleural effusion within air-fluid level most consistent with empyema. Some associated right lower lobe and right middle lobe atelectatic changes are seen. No parenchymal nodules are noted. Upper Abdomen: Visualized upper abdomen shows a masslike density measuring 4.8 cm in the upper pole of the left kidney this is incompletely evaluated on this exam. No other focal abnormality  in the upper abdomen is seen. Musculoskeletal: Bony structures show degenerative change of the thoracic spine. No acute bony abnormality is noted. IMPRESSION: Loculated pleural fluid collection with air-fluid level consistent with an empyema. Tube drainage is recommended. Suggestion of a right upper pole renal mass as described. This is incompletely evaluated on this exam. CT of the abdomen and pelvis with contrast material is recommended for further evaluation. These results will be called to the ordering clinician or representative by the Radiologist Assistant, and communication documented in the PACS or zVision Dashboard. Electronically Signed   By: Alcide Clever M.D.   On: 05/11/2016 17:33    Time Spent in minutes  30   Pearson Grippe M.D on 05/12/2016 at 6:58 AM  Between 7am to 7am - Pager - [phone]

## 2016-05-12 NOTE — Progress Notes (Signed)
Nursing Note; Pt picked up by [facility].Paperwork provided.wbb

## 2016-05-12 NOTE — Progress Notes (Signed)
Pt arrived to floor via [facility]. Vitals are stable. Pt is SOB with activity. Call bell within reach, juice given, and pt is resting comfortably in bed.

## 2016-05-13 ENCOUNTER — Inpatient Hospital Stay (HOSPITAL_COMMUNITY): Payer: 59

## 2016-05-13 ENCOUNTER — Encounter (HOSPITAL_COMMUNITY): Payer: Self-pay | Admitting: Anesthesiology

## 2016-05-13 ENCOUNTER — Encounter (HOSPITAL_COMMUNITY)
Admission: AD | Disposition: A | Discharge status: Home / Self Care | Payer: Self-pay | Source: Ambulatory Visit | Attending: Surgery

## 2016-05-13 ENCOUNTER — Inpatient Hospital Stay (HOSPITAL_COMMUNITY): Payer: 59 | Admitting: Anesthesiology

## 2016-05-13 DIAGNOSIS — J869 Pyothorax without fistula: Secondary | ICD-10-CM

## 2016-05-13 HISTORY — PX: VIDEO ASSISTED THORACOSCOPY (VATS)/EMPYEMA: SHX6172

## 2016-05-13 LAB — GLUCOSE, CAPILLARY
Glucose-Capillary: 139 mg/dL — ABNORMAL HIGH (ref 65–99)
Glucose-Capillary: 144 mg/dL — ABNORMAL HIGH (ref 65–99)
Glucose-Capillary: 171 mg/dL — ABNORMAL HIGH (ref 65–99)

## 2016-05-13 LAB — CBC
HCT: 29.3 % — ABNORMAL LOW (ref 39.0–52.0)
Hemoglobin: 8.9 g/dL — ABNORMAL LOW (ref 13.0–17.0)
MCH: 27.7 pg (ref 26.0–34.0)
MCHC: 30.4 g/dL (ref 30.0–36.0)
MCV: 91.3 fL (ref 78.0–100.0)
Platelets: 539 10*3/uL — ABNORMAL HIGH (ref 150–400)
RBC: 3.21 MIL/uL — ABNORMAL LOW (ref 4.22–5.81)
RDW: 14.6 % (ref 11.5–15.5)
WBC: 22.1 10*3/uL — ABNORMAL HIGH (ref 4.0–10.5)

## 2016-05-13 LAB — TYPE AND SCREEN
ABO/RH(D): A POS
Antibody Screen: NEGATIVE

## 2016-05-13 LAB — GRAM STAIN

## 2016-05-13 LAB — HIV ANTIBODY (ROUTINE TESTING W REFLEX): HIV Screen 4th Generation wRfx: NONREACTIVE

## 2016-05-13 LAB — ABO/RH: ABO/RH(D): A POS

## 2016-05-13 LAB — MRSA PCR SCREENING: MRSA by PCR: NEGATIVE

## 2016-05-13 SURGERY — VIDEO ASSISTED THORACOSCOPY (VATS)/EMPYEMA
Anesthesia: General | Site: Chest | Laterality: Right

## 2016-05-13 MED ORDER — CALCIUM CHLORIDE 10 % IV SOLN
INTRAVENOUS | Status: DC | PRN
  Administered 2016-05-13: 200 mg via INTRAVENOUS

## 2016-05-13 MED ORDER — FENTANYL CITRATE (PF) 250 MCG/5ML IJ SOLN
INTRAMUSCULAR | Status: AC
  Filled 2016-05-13: qty 5

## 2016-05-13 MED ORDER — POTASSIUM CHLORIDE 2 MEQ/ML IV SOLN
30.0000 meq | Freq: Every day | INTRAVENOUS | Status: DC | PRN
  Filled 2016-05-13: qty 15

## 2016-05-13 MED ORDER — INSULIN ASPART 100 UNIT/ML ~~LOC~~ SOLN
0.0000 [IU] | SUBCUTANEOUS | Status: DC
  Administered 2016-05-13: 4 [IU] via SUBCUTANEOUS
  Administered 2016-05-13 – 2016-05-14 (×3): 2 [IU] via SUBCUTANEOUS

## 2016-05-13 MED ORDER — ACETAMINOPHEN 160 MG/5ML PO SOLN: 1000.0000 mg | Freq: Four times a day (QID) | ORAL | Status: AC

## 2016-05-13 MED ORDER — ALBUTEROL SULFATE HFA 108 (90 BASE) MCG/ACT IN AERS
INHALATION_SPRAY | RESPIRATORY_TRACT | Status: DC | PRN
  Administered 2016-05-13: 2 via RESPIRATORY_TRACT

## 2016-05-13 MED ORDER — MIDAZOLAM HCL 5 MG/5ML IJ SOLN
INTRAMUSCULAR | Status: DC | PRN
  Administered 2016-05-13: 1 mg via INTRAVENOUS

## 2016-05-13 MED ORDER — NOREPINEPHRINE BITARTRATE 1 MG/ML IV SOLN
0.0000 ug/min | INTRAVENOUS | Status: DC
  Administered 2016-05-13: 4 ug/min via INTRAVENOUS
  Filled 2016-05-13: qty 4

## 2016-05-13 MED ORDER — MIDAZOLAM HCL 2 MG/2ML IJ SOLN
INTRAMUSCULAR | Status: AC
  Filled 2016-05-13: qty 2

## 2016-05-13 MED ORDER — ONDANSETRON HCL 4 MG/2ML IJ SOLN
INTRAMUSCULAR | Status: DC | PRN
  Administered 2016-05-13: 4 mg via INTRAVENOUS

## 2016-05-13 MED ORDER — ROCURONIUM BROMIDE 50 MG/5ML IV SOSY
PREFILLED_SYRINGE | INTRAVENOUS | Status: AC
  Filled 2016-05-13: qty 5

## 2016-05-13 MED ORDER — ROCURONIUM BROMIDE 100 MG/10ML IV SOLN
INTRAVENOUS | Status: DC | PRN
  Administered 2016-05-13: 50 mg via INTRAVENOUS

## 2016-05-13 MED ORDER — DIPHENHYDRAMINE HCL 50 MG/ML IJ SOLN: 12.5000 mg | Freq: Four times a day (QID) | INTRAMUSCULAR | Status: DC | PRN

## 2016-05-13 MED ORDER — VANCOMYCIN HCL IN DEXTROSE 1-5 GM/200ML-% IV SOLN
1000.0000 mg | Freq: Two times a day (BID) | INTRAVENOUS | Status: DC
  Administered 2016-05-13 – 2016-05-19 (×12): 1000 mg via INTRAVENOUS
  Filled 2016-05-13 (×15): qty 200

## 2016-05-13 MED ORDER — DIPHENHYDRAMINE HCL 12.5 MG/5ML PO ELIX: 12.5000 mg | ORAL_SOLUTION | Freq: Four times a day (QID) | ORAL | Status: DC | PRN

## 2016-05-13 MED ORDER — EPHEDRINE SULFATE 50 MG/ML IJ SOLN
INTRAMUSCULAR | Status: DC | PRN
  Administered 2016-05-13: 10 mg via INTRAVENOUS
  Administered 2016-05-13: 15 mg via INTRAVENOUS

## 2016-05-13 MED ORDER — OXYCODONE HCL 5 MG/5ML PO SOLN: 5.0000 mg | Freq: Once | ORAL | Status: DC | PRN

## 2016-05-13 MED ORDER — SENNOSIDES-DOCUSATE SODIUM 8.6-50 MG PO TABS
1.0000 | ORAL_TABLET | Freq: Every day | ORAL | Status: DC
  Administered 2016-05-13 – 2016-05-18 (×4): 1 via ORAL
  Filled 2016-05-13 (×6): qty 1

## 2016-05-13 MED ORDER — LEVOFLOXACIN IN D5W 750 MG/150ML IV SOLN
750.0000 mg | INTRAVENOUS | Status: DC
  Administered 2016-05-13: 750 mg via INTRAVENOUS
  Filled 2016-05-13: qty 150

## 2016-05-13 MED ORDER — ONDANSETRON HCL 4 MG/2ML IJ SOLN
INTRAMUSCULAR | Status: AC
  Filled 2016-05-13: qty 2

## 2016-05-13 MED ORDER — LEVALBUTEROL HCL 0.63 MG/3ML IN NEBU
0.6300 mg | INHALATION_SOLUTION | Freq: Four times a day (QID) | RESPIRATORY_TRACT | Status: DC
  Administered 2016-05-13 – 2016-05-14 (×4): 0.63 mg via RESPIRATORY_TRACT
  Filled 2016-05-13 (×3): qty 3

## 2016-05-13 MED ORDER — SUCCINYLCHOLINE CHLORIDE 200 MG/10ML IV SOSY
PREFILLED_SYRINGE | INTRAVENOUS | Status: AC
  Filled 2016-05-13: qty 10

## 2016-05-13 MED ORDER — SODIUM CHLORIDE 0.9 % IV SOLN: Freq: Once | INTRAVENOUS | Status: DC

## 2016-05-13 MED ORDER — OXYCODONE HCL 5 MG PO TABS: 5.0000 mg | ORAL_TABLET | Freq: Once | ORAL | Status: DC | PRN

## 2016-05-13 MED ORDER — SUGAMMADEX SODIUM 500 MG/5ML IV SOLN
INTRAVENOUS | Status: AC
  Filled 2016-05-13: qty 5

## 2016-05-13 MED ORDER — PHENYLEPHRINE HCL 10 MG/ML IJ SOLN
INTRAMUSCULAR | Status: DC | PRN
  Administered 2016-05-13 (×2): 80 ug via INTRAVENOUS

## 2016-05-13 MED ORDER — PROPOFOL 10 MG/ML IV BOLUS
INTRAVENOUS | Status: AC
  Filled 2016-05-13: qty 20

## 2016-05-13 MED ORDER — ONDANSETRON HCL 4 MG/2ML IJ SOLN: 4.0000 mg | Freq: Four times a day (QID) | INTRAMUSCULAR | Status: DC | PRN

## 2016-05-13 MED ORDER — NALOXONE HCL 0.4 MG/ML IJ SOLN: 0.4000 mg | INTRAMUSCULAR | Status: DC | PRN

## 2016-05-13 MED ORDER — PROPOFOL 10 MG/ML IV BOLUS
INTRAVENOUS | Status: DC | PRN
  Administered 2016-05-13: 150 mg via INTRAVENOUS

## 2016-05-13 MED ORDER — FENTANYL 40 MCG/ML IV SOLN
INTRAVENOUS | Status: DC
  Administered 2016-05-13: 60 ug via INTRAVENOUS
  Administered 2016-05-13: 10:00:00 via INTRAVENOUS
  Administered 2016-05-13: 75 ug via INTRAVENOUS
  Administered 2016-05-13: 165 ug via INTRAVENOUS
  Administered 2016-05-14: 60 ug via INTRAVENOUS
  Administered 2016-05-14: 75 ug via INTRAVENOUS
  Administered 2016-05-14: 30 ug via INTRAVENOUS
  Administered 2016-05-14: 45 ug via INTRAVENOUS
  Administered 2016-05-14: 0 ug via INTRAVENOUS
  Administered 2016-05-14: 15 ug via INTRAVENOUS
  Administered 2016-05-15: 75 ug via INTRAVENOUS
  Administered 2016-05-15: 19:00:00 via INTRAVENOUS
  Administered 2016-05-15 (×2): 30 ug via INTRAVENOUS
  Administered 2016-05-15: 45 ug via INTRAVENOUS
  Administered 2016-05-15: 15 ug via INTRAVENOUS
  Administered 2016-05-16: 135 ug via INTRAVENOUS
  Administered 2016-05-16: 105 ug via INTRAVENOUS
  Administered 2016-05-16: 75 ug via INTRAVENOUS
  Administered 2016-05-17: 135 ug via INTRAVENOUS
  Administered 2016-05-17: 1000 ug via INTRAVENOUS
  Administered 2016-05-17: 90 ug via INTRAVENOUS
  Administered 2016-05-17: 120 ug via INTRAVENOUS
  Administered 2016-05-17: 60 ug via INTRAVENOUS
  Administered 2016-05-18: 195 ug via INTRAVENOUS
  Filled 2016-05-13 (×3): qty 25

## 2016-05-13 MED ORDER — EPHEDRINE 5 MG/ML INJ
INTRAVENOUS | Status: AC
  Filled 2016-05-13: qty 10

## 2016-05-13 MED ORDER — SUGAMMADEX SODIUM 200 MG/2ML IV SOLN
INTRAVENOUS | Status: DC | PRN
  Administered 2016-05-13: 420 mg via INTRAVENOUS

## 2016-05-13 MED ORDER — SUCCINYLCHOLINE CHLORIDE 20 MG/ML IJ SOLN
INTRAMUSCULAR | Status: DC | PRN
  Administered 2016-05-13: 120 mg via INTRAVENOUS

## 2016-05-13 MED ORDER — ALBUMIN HUMAN 5 % IV SOLN
INTRAVENOUS | Status: DC | PRN
  Administered 2016-05-13 (×2): via INTRAVENOUS

## 2016-05-13 MED ORDER — ALBUTEROL SULFATE HFA 108 (90 BASE) MCG/ACT IN AERS
INHALATION_SPRAY | RESPIRATORY_TRACT | Status: AC
  Filled 2016-05-13: qty 6.7

## 2016-05-13 MED ORDER — CALCIUM CHLORIDE 10 % IV SOLN
INTRAVENOUS | Status: AC
  Filled 2016-05-13: qty 10

## 2016-05-13 MED ORDER — ACETAMINOPHEN 500 MG PO TABS
1000.0000 mg | ORAL_TABLET | Freq: Four times a day (QID) | ORAL | Status: AC
  Administered 2016-05-13 – 2016-05-18 (×15): 1000 mg via ORAL
  Filled 2016-05-13 (×15): qty 2

## 2016-05-13 MED ORDER — LACTATED RINGERS IV SOLN
INTRAVENOUS | Status: DC | PRN
  Administered 2016-05-13: 07:00:00 via INTRAVENOUS

## 2016-05-13 MED ORDER — PHENYLEPHRINE 40 MCG/ML (10ML) SYRINGE FOR IV PUSH (FOR BLOOD PRESSURE SUPPORT)
PREFILLED_SYRINGE | INTRAVENOUS | Status: AC
  Filled 2016-05-13: qty 10

## 2016-05-13 MED ORDER — LIDOCAINE 2% (20 MG/ML) 5 ML SYRINGE
INTRAMUSCULAR | Status: AC
  Filled 2016-05-13: qty 5

## 2016-05-13 MED ORDER — DEXTROSE 5 % IV SOLN
INTRAVENOUS | Status: DC | PRN
  Administered 2016-05-13: 30 ug/min via INTRAVENOUS

## 2016-05-13 MED ORDER — ENOXAPARIN SODIUM 40 MG/0.4ML ~~LOC~~ SOLN
40.0000 mg | Freq: Every day | SUBCUTANEOUS | Status: DC
  Administered 2016-05-14 – 2016-05-19 (×6): 40 mg via SUBCUTANEOUS
  Filled 2016-05-13 (×6): qty 0.4

## 2016-05-13 MED ORDER — 0.9 % SODIUM CHLORIDE (POUR BTL) OPTIME
TOPICAL | Status: DC | PRN
  Administered 2016-05-13: 2000 mL

## 2016-05-13 MED ORDER — HYDROMORPHONE HCL 1 MG/ML IJ SOLN: 0.2500 mg | INTRAMUSCULAR | Status: DC | PRN

## 2016-05-13 MED ORDER — SODIUM CHLORIDE 0.9% FLUSH: 9.0000 mL | INTRAVENOUS | Status: DC | PRN

## 2016-05-13 MED ORDER — KETOROLAC TROMETHAMINE 15 MG/ML IJ SOLN
15.0000 mg | Freq: Four times a day (QID) | INTRAMUSCULAR | Status: AC | PRN
Start: 2016-05-13 — End: 2016-05-18
  Filled 2016-05-13: qty 1

## 2016-05-13 MED ORDER — BISACODYL 5 MG PO TBEC
10.0000 mg | DELAYED_RELEASE_TABLET | Freq: Every day | ORAL | Status: DC
  Administered 2016-05-14 – 2016-05-19 (×4): 10 mg via ORAL
  Filled 2016-05-13 (×4): qty 2

## 2016-05-13 MED ORDER — FENTANYL 40 MCG/ML IV SOLN
INTRAVENOUS | Status: AC
  Filled 2016-05-13: qty 25

## 2016-05-13 MED ORDER — KCL IN DEXTROSE-NACL 20-5-0.9 MEQ/L-%-% IV SOLN
INTRAVENOUS | Status: DC
  Administered 2016-05-13: 15:00:00 via INTRAVENOUS
  Administered 2016-05-14: 1000 mL via INTRAVENOUS
  Administered 2016-05-14 – 2016-05-15 (×2): via INTRAVENOUS
  Filled 2016-05-13 (×5): qty 1000

## 2016-05-13 MED ORDER — FENTANYL CITRATE (PF) 100 MCG/2ML IJ SOLN
INTRAMUSCULAR | Status: DC | PRN
  Administered 2016-05-13: 50 ug via INTRAVENOUS
  Administered 2016-05-13 (×2): 100 ug via INTRAVENOUS

## 2016-05-13 MED ORDER — SUCCINYLCHOLINE CHLORIDE 200 MG/10ML IV SOSY
PREFILLED_SYRINGE | INTRAVENOUS | Status: AC
Start: 2016-05-13 — End: 2016-05-13
  Filled 2016-05-13: qty 10

## 2016-05-13 SURGICAL SUPPLY — 87 items
APPLICATOR TIP COSEAL (VASCULAR PRODUCTS) IMPLANT
APPLICATOR TIP EXT COSEAL (VASCULAR PRODUCTS) IMPLANT
BENZOIN TINCTURE PRP APPL 2/3 (GAUZE/BANDAGES/DRESSINGS) IMPLANT
CANISTER SUCTION 2500CC (MISCELLANEOUS) ×2 IMPLANT
CATH KIT ON Q 5IN SLV (PAIN MANAGEMENT) IMPLANT
CATH THORACIC 28FR (CATHETERS) IMPLANT
CATH THORACIC 36FR (CATHETERS) ×2 IMPLANT
CATH THORACIC 36FR RT ANG (CATHETERS) ×2 IMPLANT
CATH TROCAR 32FR (CATHETERS) ×2 IMPLANT
CLEANER TIP ELECTROSURG 2X2 (MISCELLANEOUS) ×2 IMPLANT
CLIP TI MEDIUM 6 (CLIP) ×2 IMPLANT
CONN 3/8X3/8 GISH STERILE (MISCELLANEOUS) ×2 IMPLANT
CONN ST 1/4X3/8  BEN (MISCELLANEOUS)
CONN ST 1/4X3/8 BEN (MISCELLANEOUS) IMPLANT
CONN Y 3/8X3/8X3/8  BEN (MISCELLANEOUS)
CONN Y 3/8X3/8X3/8 BEN (MISCELLANEOUS) IMPLANT
CONT SPEC 4OZ CLIKSEAL STRL BL (MISCELLANEOUS) ×4 IMPLANT
COVER SURGICAL LIGHT HANDLE (MISCELLANEOUS) ×4 IMPLANT
DERMABOND ADVANCED (GAUZE/BANDAGES/DRESSINGS)
DERMABOND ADVANCED .7 DNX12 (GAUZE/BANDAGES/DRESSINGS) IMPLANT
DRAIN CHANNEL 28F RND 3/8 FF (WOUND CARE) IMPLANT
DRAIN CHANNEL 32F RND 10.7 FF (WOUND CARE) IMPLANT
DRAPE LAPAROSCOPIC ABDOMINAL (DRAPES) ×2 IMPLANT
DRAPE WARM FLUID 44X44 (DRAPE) IMPLANT
DRILL BIT 7/64X5 (BIT) IMPLANT
ELECT BLADE 6.5 EXT (BLADE) ×2 IMPLANT
ELECT REM PT RETURN 9FT ADLT (ELECTROSURGICAL) ×2
ELECTRODE REM PT RTRN 9FT ADLT (ELECTROSURGICAL) ×1 IMPLANT
GAUZE SPONGE 4X4 12PLY STRL (GAUZE/BANDAGES/DRESSINGS) ×2 IMPLANT
GLOVE BIO SURGEON STRL SZ 6 (GLOVE) ×4 IMPLANT
GLOVE BIO SURGEON STRL SZ 6.5 (GLOVE) ×6 IMPLANT
GLOVE BIOGEL PI IND STRL 6 (GLOVE) ×3 IMPLANT
GLOVE BIOGEL PI IND STRL 6.5 (GLOVE) ×2 IMPLANT
GLOVE BIOGEL PI INDICATOR 6 (GLOVE) ×3
GLOVE BIOGEL PI INDICATOR 6.5 (GLOVE) ×2
GLOVE EUDERMIC 7 POWDERFREE (GLOVE) ×4 IMPLANT
GOWN STRL REUS W/ TWL LRG LVL3 (GOWN DISPOSABLE) ×4 IMPLANT
GOWN STRL REUS W/ TWL XL LVL3 (GOWN DISPOSABLE) ×1 IMPLANT
GOWN STRL REUS W/TWL LRG LVL3 (GOWN DISPOSABLE) ×4
GOWN STRL REUS W/TWL XL LVL3 (GOWN DISPOSABLE) ×1
KIT BASIN OR (CUSTOM PROCEDURE TRAY) ×2 IMPLANT
KIT ROOM TURNOVER OR (KITS) ×2 IMPLANT
KIT SUCTION CATH 14FR (SUCTIONS) ×2 IMPLANT
NS IRRIG 1000ML POUR BTL (IV SOLUTION) ×4 IMPLANT
PACK CHEST (CUSTOM PROCEDURE TRAY) ×2 IMPLANT
PAD ARMBOARD 7.5X6 YLW CONV (MISCELLANEOUS) ×4 IMPLANT
SEALANT SURG COSEAL 4ML (VASCULAR PRODUCTS) ×2 IMPLANT
SEALANT SURG COSEAL 8ML (VASCULAR PRODUCTS) IMPLANT
SOLUTION ANTI FOG 6CC (MISCELLANEOUS) ×2 IMPLANT
SPONGE GAUZE 4X4 12PLY STER LF (GAUZE/BANDAGES/DRESSINGS) ×2 IMPLANT
SUT ETHILON 3 0 FSL (SUTURE) ×2 IMPLANT
SUT PROLENE 3 0 SH DA (SUTURE) IMPLANT
SUT PROLENE 4 0 RB 1 (SUTURE)
SUT PROLENE 4-0 RB1 .5 CRCL 36 (SUTURE) IMPLANT
SUT SILK  1 MH (SUTURE) ×2
SUT SILK 1 MH (SUTURE) ×2 IMPLANT
SUT SILK 1 TIES 10X30 (SUTURE) IMPLANT
SUT SILK 2 0 SH (SUTURE) ×4 IMPLANT
SUT SILK 2 0SH CR/8 30 (SUTURE) IMPLANT
SUT SILK 3 0SH CR/8 30 (SUTURE) IMPLANT
SUT VIC AB 1 CTX 18 (SUTURE) IMPLANT
SUT VIC AB 1 CTX 36 (SUTURE)
SUT VIC AB 1 CTX36XBRD ANBCTR (SUTURE) IMPLANT
SUT VIC AB 2-0 CT1 27 (SUTURE)
SUT VIC AB 2-0 CT1 TAPERPNT 27 (SUTURE) IMPLANT
SUT VIC AB 2-0 CTX 36 (SUTURE) IMPLANT
SUT VIC AB 2-0 UR6 27 (SUTURE) ×2 IMPLANT
SUT VIC AB 3-0 MH 27 (SUTURE) IMPLANT
SUT VIC AB 3-0 SH 27 (SUTURE)
SUT VIC AB 3-0 SH 27X BRD (SUTURE) IMPLANT
SUT VIC AB 3-0 X1 27 (SUTURE) ×2 IMPLANT
SUT VICRYL 2 TP 1 (SUTURE) IMPLANT
SWAB COLLECTION DEVICE MRSA (MISCELLANEOUS) IMPLANT
SYRINGE 20CC LL (MISCELLANEOUS) ×2 IMPLANT
SYRINGE 60CC LL (MISCELLANEOUS) ×2 IMPLANT
SYSTEM SAHARA CHEST DRAIN ATS (WOUND CARE) ×2 IMPLANT
TAPE CLOTH 4X10 WHT NS (GAUZE/BANDAGES/DRESSINGS) ×2 IMPLANT
TAPE CLOTH SURG 4X10 WHT LF (GAUZE/BANDAGES/DRESSINGS) ×2 IMPLANT
TIP APPLICATOR SPRAY EXTEND 16 (VASCULAR PRODUCTS) IMPLANT
TOWEL OR 17X24 6PK STRL BLUE (TOWEL DISPOSABLE) IMPLANT
TOWEL OR 17X26 10 PK STRL BLUE (TOWEL DISPOSABLE) ×4 IMPLANT
TRAP SPECIMEN MUCOUS 40CC (MISCELLANEOUS) ×4 IMPLANT
TRAY FOLEY CATH 14FRSI W/METER (CATHETERS) ×2 IMPLANT
TROCAR XCEL BLADELESS 5X75MML (TROCAR) ×4 IMPLANT
TUBE ANAEROBIC SPECIMEN COL (MISCELLANEOUS) IMPLANT
TUNNELER SHEATH ON-Q 11GX8 DSP (PAIN MANAGEMENT) IMPLANT
WATER STERILE IRR 1000ML POUR (IV SOLUTION) ×4 IMPLANT

## 2016-05-13 NOTE — Op Note (Signed)
CARDIOTHORACIC SURGERY OPERATIVE NOTE:  John [name] [id number] 05/13/2016   Preoperative Dx:  Large right empyema  Postoperative Dx: Same   Procedure: Right video-assisted thoracoscopy, drainage of empyema, decortication  Surgeon: Dr. Alleen Borne   Assistant: Doree Fudge, PA-C  Anesthesia: GET   Clinical History:   The patient is a 61 year old gentleman with hypertension, psoriatic arthritis treated with methotrexate who developed a cough in 03/2016. He was treated with antibiotics and prednisone as an outpt. He has continued to have cough and now has developed progressive shortness of breath, yellow sputum, and pleuritic right chest pain. A CXR showed an enlarging right pleural effusion and chest CT shows a large loculated right empyema with compressive atelectasis of the lung. He was admitted to [facility] on 1/30 with a temp of 100.5 and leukocytosis of 18.9. I was called by Dr. Selena Batten yesterday am and reviewed the CT scan and felt that the best option for complete drainage was right VATS or thoracotomy.  He has a large right empyema that has developed after being treated for pneumonia in late December with oral antibiotics. Given the chronicity, size, and presence of immunosuppression  I think surgical drainage with VATS or thoracotomy to allow complete drainage is the best treatment. I discussed the surgical procedure of right VATS or thoracotomy, alternatives, benefits and risks including but not limited to bleeding, blood transfusion, infection, air leak from the lung, residual space, and recurrent effusion with the patient and he understands and agrees to proceed.  Operative procedure:   The patient was seen in the preoperative holding area. The proper patient, proper operative side, proper operation were confirmed after reviewing his history and chest x-ray. The right side of the chest was signed by me. Preoperative intravenous antibiotics were given. He was taken back to the  operating room and placed on table in the supine position. After induction of general endotracheal anesthesia using a double-lumen tube, a Foley catheter was placed in the bladder using sterile technique. Lower extremity sequential compression devices were used. The patient was positioned in the left lateral decubitus position with the right side up. The rightt side of the chest was prepped with Betadine soap and solution and draped in the usual sterile manner. A timeout was taken and the proper patient, proper operation, and proper operative side were confirmed with nursing and anesthesia staff. A 1 cm incision was made in the midaxillary line at about the eighth intercostal space. The left pleural space was entered bluntly with a hemostat and an 8 mm trocar was inserted. There was immediate return of foul-smelling tan purulent fluid which was suctioned and sent for cultures. About 1800 cc of this fluid was removed. The 30 thoracoscope was inserted and the pleural space inspected. There was a large empyema cavity which was lined with a purulent coating. There were no significant loculations. The a second small lateral incision was made for instruments and a 1 cm posterior axillary incision for irrigation and suction. The purulent debris was removed from the chest wall and lung as well as possible and the pleural space copiously irrigated with saline. There was complete hemostasis.  Then a 86 French right angle chest tube was placed through one of the lateral incisions and positioned posterior and down into the costophrenic angle. A 32 F chest tube was then inserted through the other lateral incision and positioned posteriorly up to the apex. The posterior axillary incision was then closed in layers using a 2-0 Vicryl subcutaneous  suture and 3-0 nylon interrupted skin closure.  The chest tubes were connected to Pleur-evac suction. The sponge needle and instrument counts were correct according to the scrub nurse.  The patient was then turned into the supine position, extubated, and transported to the post anesthesia care unit in satisfactory and stable condition.

## 2016-05-13 NOTE — Progress Notes (Signed)
Pharmacy Antibiotic Note  John CrumbleRichard T [name] is a 61 y.o. male admitted on 05/11/2016 with pleural effusion with empyema and was on vancomycin, levaquin and meropenem. Patient is now s/o VATs with drainage of empyema/decortication. Spoke to Dr. Laneta SimmersBartle regarding antibiotics: Patient was observed to have a rash recently after meropenem administration and possible allergy. Pharmacy to continue dosing levaquin and vancomycin -Normalized CrCl ~ 100 -Last vancomycin dose was 750mg  at midnight -last levaquin given at ~ 7pm on 1/31     Plan: -vancomycin 1000mg  IV q12h -Levaquin 750mg  IV q24h -Will follow renal function, cultures and clinical progress    Height: 5\' 11"  (180.3 cm) Weight: 239 lb 9.6 oz (108.7 kg) IBW/kg (Calculated) : 75.3  Temp (24hrs), Avg:98.6 F (37 C), Min:98.1 F (36.7 C), Max:98.9 F (37.2 C)   Recent Labs Lab 05/11/16 1622 05/12/16 0413 05/13/16 1025  WBC 18.9* 17.2* 22.1*  CREATININE 0.90 0.72  --     Estimated Creatinine Clearance: 120.1 mL/min (by C-G formula based on SCr of 0.72 mg/dL).   05/13/16 : CrCl (n) = 86 ml/min   Allergies  Allergen Reactions  . Penicillins Rash    ++ pt's wife reported he tolerated keflex in the past++ Has patient had a PCN reaction causing immediate rash, facial/tongue/throat swelling, SOB or lightheadedness with hypotension: No Has patient had a PCN reaction causing severe rash involving mucus membranes or skin necrosis: No Has patient had a PCN reaction that required hospitalization No Has patient had a PCN reaction occurring within the last 10 years: No If all of the above answers are "NO", then may proceed with Cephalosporin use.    Antimicrobials this admission: 1/30 Vanc >>   1/30 Meropenem >>   2/1 1/30 Levofloxacin >>  Dose adjustments this admission:  1/31 750mg  IV q12h; changed to 1000mg  IV q12h on 2/1  Microbiology results: 1/30 BCx: sent 2/1pleural fluid  Thank you for allowing pharmacy to be a part of  this patient's care.  Harland GermanAndrew Malachai Schalk, Pharm D 05/13/2016 3:16 PM

## 2016-05-13 NOTE — Anesthesia Procedure Notes (Signed)
Procedure Name: Intubation Date/Time: 05/13/2016 7:52 AM Performed by: Rebekah Chesterfield L Pre-anesthesia Checklist: Patient identified, Emergency Drugs available, Suction available, Patient being monitored and Timeout performed Patient Re-evaluated:Patient Re-evaluated prior to inductionOxygen Delivery Method: Circle system utilized Preoxygenation: Pre-oxygenation with 100% oxygen Intubation Type: IV induction Ventilation: Mask ventilation without difficulty Laryngoscope Size: Mac and 4 Grade View: Grade I Tube type: Oral Endobronchial tube: Left and Double lumen EBT and 39 Fr Number of attempts: 2 Airway Equipment and Method: Patient positioned with wedge pillow and Stylet Placement Confirmation: ETT inserted through vocal cords under direct vision,  positive ETCO2,  CO2 detector and breath sounds checked- equal and bilateral Secured at: 29 cm Tube secured with: Tape Dental Injury: Teeth and Oropharynx as per pre-operative assessment  Comments: Performed by Fritz Pickerel

## 2016-05-13 NOTE — Progress Notes (Signed)
      [address]       [place] [id number]             [phone]      S/p VATS decortication  BP (!) 108/56   Pulse 89   Temp 97.5 F (36.4 C) (Oral)   Resp (!) 26   Ht 5\' 11"  (1.803 m)   Wt 239 lb 9.6 oz (108.7 kg)   SpO2 94%   BMI 33.42 kg/m    Intake/Output Summary (Last 24 hours) at 05/13/16 1710 Last data filed at 05/13/16 1700  Gross per 24 hour  Intake             2040 ml  Output             3300 ml  Net            -1260 ml   Stable early postop  Viviann SpareSteven C. Dorris FetchHendrickson, MD [facility] [phone]

## 2016-05-13 NOTE — Transfer of Care (Signed)
Immediate Anesthesia Transfer of Care Note  Patient: John [name]  Procedure(s) Performed: Procedure(s): VIDEO ASSISTED THORACOSCOPY (VATS)/DRAIN EMPYEMA (Right)  Patient Location: PACU  Anesthesia Type:General  Level of Consciousness: awake, alert  and oriented  Airway & Oxygen Therapy: Patient Spontanous Breathing and Patient connected to face mask oxygen  Post-op Assessment: Report given to RN, Post -op Vital signs reviewed and stable and Patient moving all extremities X 4  Post vital signs: Reviewed and stable  Last Vitals:  Vitals:   05/13/16 0537 05/13/16 1020  BP: 120/61   Pulse: 94   Resp: 20   Temp: 37.1 C (P) 36.7 C    Last Pain:  Vitals:   05/13/16 0537  TempSrc: Oral  PainSc:          Complications: No apparent anesthesia complications

## 2016-05-13 NOTE — Anesthesia Postprocedure Evaluation (Addendum)
Anesthesia Post Note  Patient: John CrumbleRichard T [name]  Procedure(s) Performed: Procedure(s) (LRB): VIDEO ASSISTED THORACOSCOPY (VATS)/DRAIN EMPYEMA (Right)  Patient location during evaluation: PACU Anesthesia Type: General Level of consciousness: awake and alert Pain management: pain level controlled Vital Signs Assessment: post-procedure vital signs reviewed and stable Respiratory status: spontaneous breathing, nonlabored ventilation, respiratory function stable and patient connected to face mask oxygen Cardiovascular status: blood pressure returned to baseline and stable Postop Assessment: no signs of nausea or vomiting Anesthetic complications: no       Last Vitals:  Vitals:   05/13/16 1045 05/13/16 1100  BP: (!) 119/57 (!) 96/40  Pulse:    Resp:    Temp:      Last Pain:  Vitals:   05/13/16 1029  TempSrc:   PainSc: 5                  Kortney Potvin,JAMES TERRILL

## 2016-05-13 NOTE — Anesthesia Preprocedure Evaluation (Signed)
Anesthesia Evaluation  Patient identified by MRN, date of birth, ID band Patient awake    Reviewed: Allergy & Precautions, NPO status , Patient's Chart, lab work & pertinent test results  History of Anesthesia Complications Negative for: history of anesthetic complications  Airway Mallampati: III  TM Distance: >3 FB Neck ROM: Full    Dental  (+) Teeth Intact   Pulmonary shortness of breath, former smoker,  Right empyema    + decreased breath sounds+ wheezing      Cardiovascular hypertension, Pt. on medications  Rhythm:Regular     Neuro/Psych PSYCHIATRIC DISORDERS Depression    GI/Hepatic negative GI ROS, Neg liver ROS,   Endo/Other  Morbid obesity  Renal/GU Renal disease     Musculoskeletal   Abdominal   Peds  Hematology  (+) anemia ,   Anesthesia Other Findings   Reproductive/Obstetrics                             Anesthesia Physical Anesthesia Plan  ASA: III  Anesthesia Plan: General   Post-op Pain Management:    Induction: Intravenous  Airway Management Planned: Double Lumen EBT  Additional Equipment: CVP, Arterial line and Ultrasound Guidance Line Placement  Intra-op Plan:   Post-operative Plan: Extubation in OR and Possible Post-op intubation/ventilation  Informed Consent: I have reviewed the patients History and Physical, chart, labs and discussed the procedure including the risks, benefits and alternatives for the proposed anesthesia with the patient or authorized representative who has indicated his/her understanding and acceptance.   Dental advisory given  Plan Discussed with: CRNA and Surgeon  Anesthesia Plan Comments:         Anesthesia Quick Evaluation

## 2016-05-13 NOTE — Consult Note (Signed)
[address]       [place] [address]             [phone]      Cardiothoracic Surgery Consultation   Reason for Consult: Large right empyema Referring Physician: Dr. Jani Gravel  John [name] is an 61 y.o. male.  HPI:   The patient is a 61 year old gentleman with hypertension, psoriatic arthritis treated with methotrexate who developed a cough in 03/2016. He was treated with antibiotics and prednisone as an outpt. He has continued to have cough and now has developed progressive shortness of breath, yellow sputum, and pleuritic right chest pain. A CXR showed an enlarging right pleural effusion and chest CT shows a large loculated right empyema with compressive atelectasis of the lung. He was admitted to [facility] on 1/30 with a temp of 100.5 and leukocytosis of 18.9. I was called by Dr. Maudie Mercury yesterday am and reviewed the CT scan and felt that the best option for complete drainage was right VATS or thoracotomy. He was transferred to [facility] for surgery today but did not arrive until late last night although report was given early evening.  Past Medical History:  Diagnosis Date  . Depression   . Hyperlipemia   . Hypertension   . Psoriatic arthritis [facility])     Past Surgical History:  Procedure Laterality Date  . TONSILLECTOMY      Family History  Problem Relation Age of Onset  . COPD Mother   . Diabetes Father     Social History:  reports that he quit smoking about 25 years ago. He has never used smokeless tobacco. He reports that he drinks about 0.5 oz of alcohol per week . He reports that he does not use drugs.  Allergies:  Allergies  Allergen Reactions  . Penicillins Rash    ++ pt's wife reported he tolerated keflex in the past++ Has patient had a PCN reaction causing immediate rash, facial/tongue/throat swelling, SOB or lightheadedness with hypotension: No Has patient had a PCN reaction causing severe rash involving mucus membranes or skin necrosis:  No Has patient had a PCN reaction that required hospitalization No Has patient had a PCN reaction occurring within the last 10 years: No If all of the above answers are "NO", then may proceed with Cephalosporin use.    Medications:  I have reviewed the patient's current medications. Prior to Admission:  Prescriptions Prior to Admission  Medication Sig Dispense Refill Last Dose  . albuterol (PROVENTIL HFA;VENTOLIN HFA) 108 (90 Base) MCG/ACT inhaler Inhale 2 puffs into the lungs every 4 (four) hours as needed for wheezing or shortness of breath. 1 Inhaler 0 Past Month at Unknown time  . atomoxetine (STRATTERA) 100 MG capsule Take 100 mg by mouth daily.   05/11/2016 at Unknown time  . benzonatate (TESSALON) 100 MG capsule Take 100 mg by mouth 3 (three) times daily as needed for cough.   05/11/2016 at Unknown time  . buPROPion (WELLBUTRIN XL) 300 MG 24 hr tablet Take 300 mg by mouth daily.   05/11/2016 at Unknown time  . carvedilol (COREG) 3.125 MG tablet Take 3.125 mg by mouth 2 (two) times daily with a meal.   05/11/2016 at 10am  . fish oil-omega-3 fatty acids 1000 MG capsule Take 2 g by mouth daily.   05/11/2016 at Unknown time  . FLUoxetine (PROZAC) 20 MG tablet Take 60 mg by mouth daily.   05/11/2016 at Unknown time  . glucosamine-chondroitin  500-400 MG tablet Take 1 tablet by mouth 3 (three) times daily.   05/11/2016 at Unknown time  . l-methylfolate-B6-B12 (METANX) 3-35-2 MG TABS Take 1 tablet by mouth daily.   05/11/2016 at Unknown time  . losartan (COZAAR) 25 MG tablet Take 25 mg by mouth daily.   05/11/2016 at Unknown time  . methotrexate (50 MG/ML) 1 g injection Inject into the vein once a week.    Past Month at Unknown time  . Multiple Vitamins-Minerals (MULTIVITAMIN WITH MINERALS) tablet Take 1 tablet by mouth daily.   05/11/2016 at Unknown time  . azithromycin (ZITHROMAX) 250 MG tablet 2 tabs po on day one, then one tablet po once daily on days 2-5. (Patient not taking: Reported on 05/11/2016)  6 tablet 0 Completed Course at Unknown time  . cefdinir (OMNICEF) 300 MG capsule Take 1 capsule (300 mg total) by mouth 2 (two) times daily. (Patient not taking: Reported on 05/11/2016) 14 capsule 0 Completed Course at Unknown time  . predniSONE (DELTASONE) 50 MG tablet 1 tab po daily for 6 days. Take with food. (Patient not taking: Reported on 05/11/2016) 6 tablet 0 Completed Course   Scheduled: . atomoxetine  100 mg Oral Daily  . B-complex with vitamin C  1 tablet Oral Daily  . buPROPion  300 mg Oral Daily  . carvedilol  3.125 mg Oral BID WC  . enoxaparin (LOVENOX) injection  50 mg Subcutaneous Q24H  . FLUoxetine  60 mg Oral Daily  . levofloxacin (LEVAQUIN) IV  750 mg Intravenous Q24H  . losartan  25 mg Oral Daily  . meropenem (MERREM) IV  1 g Intravenous Q8H  . vancomycin  750 mg Intravenous Q12H   Continuous: . sodium chloride 75 mL/hr at 05/12/16 2300   VOH:[id number] **OR** acetaminophen, albuterol, benzonatate, zolpidem Anti-infectives    Start     Dose/Rate Route Frequency Ordered Stop   05/12/16 0800  vancomycin (VANCOCIN) IVPB 750 mg/150 ml premix     750 mg 150 mL/hr over 60 Minutes Intravenous Every 12 hours 05/11/16 1838     05/11/16 2000  meropenem (MERREM) 1 g in sodium chloride 0.9 % 100 mL IVPB     1 g 200 mL/hr over 30 Minutes Intravenous Every 8 hours 05/11/16 1837     05/11/16 1900  vancomycin (VANCOCIN) 2,500 mg in sodium chloride 0.9 % 500 mL IVPB     2,500 mg 250 mL/hr over 120 Minutes Intravenous STAT 05/11/16 1836 05/11/16 2233   05/11/16 1900  levofloxacin (LEVAQUIN) IVPB 750 mg     750 mg 100 mL/hr over 90 Minutes Intravenous Every 24 hours 05/11/16 1836        Results for orders placed or performed during the hospital encounter of 05/11/16 (from the past 48 hour(s))  Culture, blood (routine x 2)     Status: None (Preliminary result)   Collection Time: 05/11/16  3:47 PM  Result Value Ref Range   Specimen Description BLOOD RIGHT ARM    Special  Requests BOTTLES DRAWN AEROBIC AND ANAEROBIC 10CC    Culture      NO GROWTH < 24 HOURS Performed at [facility], [address], [place], [place] [address]    Report Status PENDING   Culture, blood (routine x 2)     Status: None (Preliminary result)   Collection Time: 05/11/16  3:47 PM  Result Value Ref Range   Specimen Description BLOOD LEFT ARM    Special Requests BOTTLES DRAWN AEROBIC ONLY 5CC  Culture      NO GROWTH < 24 HOURS Performed at [facility], [place] [address], [place], [place] [address]    Report Status PENDING   Comprehensive metabolic panel     Status: Abnormal   Collection Time: 05/11/16  4:22 PM  Result Value Ref Range   Sodium 136 135 - 145 mmol/L   Potassium 4.2 3.5 - 5.1 mmol/L   Chloride 101 101 - 111 mmol/L   CO2 26 22 - 32 mmol/L   Glucose, Bld 139 (H) 65 - 99 mg/dL   BUN 24 (H) 6 - 20 mg/dL   Creatinine, Ser 0.90 0.61 - 1.24 mg/dL   Calcium 8.2 (L) 8.9 - 10.3 mg/dL   Total Protein 6.9 6.5 - 8.1 g/dL   Albumin 2.5 (L) 3.5 - 5.0 g/dL   AST 122 (H) 15 - 41 U/L   ALT 114 (H) 17 - 63 U/L   Alkaline Phosphatase 151 (H) 38 - 126 U/L   Total Bilirubin 0.9 0.3 - 1.2 mg/dL   GFR calc non Af Amer >60 >60 mL/min   GFR calc Af Amer >60 >60 mL/min    Comment: (NOTE) The eGFR has been calculated using the CKD EPI equation. This calculation has not been validated in all clinical situations. eGFR's persistently <60 mL/min signify possible Chronic Kidney Disease.    Anion gap 9 5 - 15  CBC WITH DIFFERENTIAL     Status: Abnormal   Collection Time: 05/11/16  4:22 PM  Result Value Ref Range   WBC 18.9 (H) 4.0 - 10.5 K/uL   RBC 3.46 (L) 4.22 - 5.81 MIL/uL   Hemoglobin 9.9 (L) 13.0 - 17.0 g/dL   HCT 31.3 (L) 39.0 - 52.0 %   MCV 90.5 78.0 - 100.0 fL   MCH 28.6 26.0 - 34.0 pg   MCHC 31.6 30.0 - 36.0 g/dL   RDW 14.4 11.5 - 15.5 %   Platelets 536 (H) 150 - 400 K/uL   Neutrophils Relative % 85 %   Neutro Abs 16.0 (H) 1.7 - 7.7 K/uL   Lymphocytes  Relative 5 %   Lymphs Abs 0.9 0.7 - 4.0 K/uL   Monocytes Relative 10 %   Monocytes Absolute 1.9 (H) 0.1 - 1.0 K/uL   Eosinophils Relative 0 %   Eosinophils Absolute 0.0 0.0 - 0.7 K/uL   Basophils Relative 0 %   Basophils Absolute 0.0 0.0 - 0.1 K/uL  APTT     Status: None   Collection Time: 05/11/16  4:22 PM  Result Value Ref Range   aPTT 35 24 - 36 seconds  Protime-INR     Status: None   Collection Time: 05/11/16  4:22 PM  Result Value Ref Range   Prothrombin Time 15.0 11.4 - 15.2 seconds   INR 1.18   TSH     Status: None   Collection Time: 05/11/16  4:22 PM  Result Value Ref Range   TSH 0.690 0.350 - 4.500 uIU/mL    Comment: Performed by a 3rd Generation assay with a functional sensitivity of <=0.01 uIU/mL.  Lactate dehydrogenase     Status: None   Collection Time: 05/11/16  4:22 PM  Result Value Ref Range   LDH 174 98 - 192 U/L  Comprehensive metabolic panel     Status: Abnormal   Collection Time: 05/12/16  4:13 AM  Result Value Ref Range   Sodium 135 135 - 145 mmol/L   Potassium 4.3 3.5 - 5.1 mmol/L  Chloride 104 101 - 111 mmol/L   CO2 21 (L) 22 - 32 mmol/L   Glucose, Bld 148 (H) 65 - 99 mg/dL   BUN 19 6 - 20 mg/dL   Creatinine, Ser 0.72 0.61 - 1.24 mg/dL   Calcium 7.9 (L) 8.9 - 10.3 mg/dL   Total Protein 6.4 (L) 6.5 - 8.1 g/dL   Albumin 2.4 (L) 3.5 - 5.0 g/dL   AST 114 (H) 15 - 41 U/L   ALT 124 (H) 17 - 63 U/L   Alkaline Phosphatase 150 (H) 38 - 126 U/L   Total Bilirubin 1.1 0.3 - 1.2 mg/dL   GFR calc non Af Amer >60 >60 mL/min   GFR calc Af Amer >60 >60 mL/min    Comment: (NOTE) The eGFR has been calculated using the CKD EPI equation. This calculation has not been validated in all clinical situations. eGFR's persistently <60 mL/min signify possible Chronic Kidney Disease.    Anion gap 10 5 - 15  CBC     Status: Abnormal   Collection Time: 05/12/16  4:13 AM  Result Value Ref Range   WBC 17.2 (H) 4.0 - 10.5 K/uL   RBC 3.41 (L) 4.22 - 5.81 MIL/uL    Hemoglobin 9.7 (L) 13.0 - 17.0 g/dL   HCT 30.4 (L) 39.0 - 52.0 %   MCV 89.1 78.0 - 100.0 fL   MCH 28.4 26.0 - 34.0 pg   MCHC 31.9 30.0 - 36.0 g/dL   RDW 14.5 11.5 - 15.5 %   Platelets 494 (H) 150 - 400 K/uL  HIV antibody     Status: None   Collection Time: 05/12/16 11:00 AM  Result Value Ref Range   HIV Screen 4th Generation wRfx Non Reactive Non Reactive    Comment: (NOTE) Performed At: [facility] [place], [place] [id number] Lindon Romp MD KD:[id number]     Ct Chest W Contrast  Result Date: 05/11/2016 CLINICAL DATA:  Extensive right pleural effusion EXAM: CT CHEST WITH CONTRAST TECHNIQUE: Multidetector CT imaging of the chest was performed during intravenous contrast administration. CONTRAST:  31m ISOVUE-300 IOPAMIDOL (ISOVUE-300) INJECTION 61% COMPARISON:  Chest x-ray from earlier in the same day, 04/26/2016 FINDINGS: Cardiovascular: Thoracic aorta demonstrates some calcific changes without aneurysmal dilatation or dissection. Pulmonary artery shows no large central filling defects although timing for emboli it was not performed. Cardiac structures are within normal limits. Mediastinum/Nodes: The thoracic inlet shows no acute abnormality. Scattered small lymph nodes are noted throughout the mediastinum likely of reactive nature. No sizable lymph nodes are seen. Lungs/Pleura: The left lung is within normal limits. The right hemithorax demonstrates evidence of a large loculated pleural effusion within air-fluid level most consistent with empyema. Some associated right lower lobe and right middle lobe atelectatic changes are seen. No parenchymal nodules are noted. Upper Abdomen: Visualized upper abdomen shows a masslike density measuring 4.8 cm in the upper pole of the left kidney this is incompletely evaluated on this exam. No other focal abnormality in the upper abdomen is seen. Musculoskeletal: Bony structures show degenerative change of the thoracic spine. No  acute bony abnormality is noted. IMPRESSION: Loculated pleural fluid collection with air-fluid level consistent with an empyema. Tube drainage is recommended. Suggestion of a right upper pole renal mass as described. This is incompletely evaluated on this exam. CT of the abdomen and pelvis with contrast material is recommended for further evaluation. These results will be called to the ordering clinician or representative by the Radiologist Assistant, and  communication documented in the PACS or zVision Dashboard. Electronically Signed   By: Inez Catalina M.D.   On: 05/11/2016 17:33    Review of Systems  Constitutional: Positive for fever and malaise/fatigue.  HENT: Negative.   Eyes: Negative.   Respiratory: Positive for cough and shortness of breath. Negative for hemoptysis and sputum production.   Cardiovascular: Positive for orthopnea. Negative for chest pain and leg swelling.  Gastrointestinal: Negative.   Genitourinary: Negative.   Musculoskeletal: Positive for joint pain.  Skin: Negative.   Neurological: Negative.   Endo/Heme/Allergies: Negative.   Psychiatric/Behavioral: Positive for depression.   Blood pressure 118/74, pulse 84, temperature 98.9 F (37.2 C), temperature source Oral, resp. rate (!) 22, height '5\' 11"'  (1.803 m), weight 108.1 kg (238 lb 4.8 oz), SpO2 96 %. Physical Exam  Constitutional: He is oriented to person, place, and time. He appears well-developed and well-nourished.  Increased work of breathing  HENT:  Head: Atraumatic.  Mouth/Throat: Oropharynx is clear and moist.  Eyes: EOM are normal. Pupils are equal, round, and reactive to light.  Cardiovascular: Normal rate, regular rhythm and normal heart sounds.   No murmur heard. Respiratory: He is in respiratory distress.  Decreased breath sounds right lung  GI: Soft. Bowel sounds are normal. There is no tenderness.  Musculoskeletal: He exhibits no edema.  Lymphadenopathy:    He has no cervical adenopathy.    Neurological: He is alert and oriented to person, place, and time.  Skin: Skin is warm and dry. Rash noted.  Red, non-raised rash over anterior chest bilaterally. Wife says it is new this am.  Psychiatric: He has a normal mood and affect.      CLINICAL DATA:  Extensive right pleural effusion  EXAM: CT CHEST WITH CONTRAST  TECHNIQUE: Multidetector CT imaging of the chest was performed during intravenous contrast administration.  CONTRAST:  65m ISOVUE-300 IOPAMIDOL (ISOVUE-300) INJECTION 61%  COMPARISON:  Chest x-ray from earlier in the same day, 04/26/2016  FINDINGS: Cardiovascular: Thoracic aorta demonstrates some calcific changes without aneurysmal dilatation or dissection. Pulmonary artery shows no large central filling defects although timing for emboli it was not performed. Cardiac structures are within normal limits.  Mediastinum/Nodes: The thoracic inlet shows no acute abnormality. Scattered small lymph nodes are noted throughout the mediastinum likely of reactive nature. No sizable lymph nodes are seen.  Lungs/Pleura: The left lung is within normal limits. The right hemithorax demonstrates evidence of a large loculated pleural effusion within air-fluid level most consistent with empyema. Some associated right lower lobe and right middle lobe atelectatic changes are seen. No parenchymal nodules are noted.  Upper Abdomen: Visualized upper abdomen shows a masslike density measuring 4.8 cm in the upper pole of the left kidney this is incompletely evaluated on this exam. No other focal abnormality in the upper abdomen is seen.  Musculoskeletal: Bony structures show degenerative change of the thoracic spine. No acute bony abnormality is noted.  IMPRESSION: Loculated pleural fluid collection with air-fluid level consistent with an empyema. Tube drainage is recommended.  Suggestion of a right upper pole renal mass as described. This is incompletely  evaluated on this exam. CT of the abdomen and pelvis with contrast material is recommended for further evaluation.  These results will be called to the ordering clinician or representative by the Radiologist Assistant, and communication documented in the PACS or zVision Dashboard.   Electronically Signed   By: MInez CatalinaM.D.   On: 05/11/2016 17:33  Assessment/Plan:  He has a large right  empyema that has developed after being treated for pneumonia in late December with oral antibiotics. Given the chronicity, size, and presence of immunosuppression  I think surgical drainage with VATS or thoracotomy to allow complete drainage is the best treatment. I discussed the surgical procedure of right VATS or thoracotomy, alternatives, benefits and risks including but not limited to bleeding, blood transfusion, infection, air leak from the lung, residual space, and recurrent effusion with the patient and he understands and agrees to proceed.  Gaye Pollack 05/13/2016, 5:39 AM

## 2016-05-13 NOTE — Progress Notes (Signed)
Patient ID: John [name], male   DOB: 1955-09-29, 61 y.o.   MRN: [id number]                                                                PROGRESS NOTE                                                                                                                                                                                                             Patient Demographics:    John [name], is a 61 y.o. male, DOB - 10/27/1955, WUJ:[id number]  Admit date - 05/11/2016   Admitting Physician Pearson Grippe, MD  Outpatient Primary MD for the patient is Pearson Grippe, MD  LOS - 2  Outpatient Specialists: Alben Deeds  No chief complaint on file.    Dyspnea  Brief Narrative   60 y.o.male,w psoriasis c/o cough since December 2017, Pt was seen in ER and tx with omnicef and zithromax. Pt then continued to have cough and seen in office, and tx with. Pt presented today due to worsening dyspnea and was found to have right sided pleural effusion on CXR. Pt sent to hospital for direct admission. Pt has not taken methotrexate for the past 2 weeks due to feeling poorly.   On Arrival to [facility] had CT chest which shows a loculated pleural effusion. Pt has been afebrile, CT scan also showed possible right renal mass incompletely visualized on CT chest. Pt will be admitted for dyspnea from pleural effusion ? Empyema. Ultrasound guided thoracentesis has been ordered.  Discussed case with pulmonary who recommended IV abx. Pt seen by CT surgery who recommended VATS and decortication of empyema.    Subjective:    John [name] today is s/p VATS /decortication.  Pt is doing well.  Dyspnea improved.  Afebrile.  Pt states that pain is controlled.    Assessment  & Plan :    Active Problems:   Dyspnea   Pleural effusion   Psoriatic arthritis (HCC)   Empyema (HCC)  1. R pleural effusion/empyema s/p VATS/ decortication 2/1 Awaiting cultures Cont iv abx, vanco, meropenem, levaquin Appreciate CT  surgery and pulmonary input  2. R renal lesion CT scan abd/ pelvis when more stable  3. Psoriasis Methotrexate on hold  4. Hypertension Cont current bp medications.  5. Abnormal liver function Check cmp in am  6. Anemia Repeat cbc in am   Code Status : FULL CODE  Family Communication  : w patient and wife  Disposition Plan  : home  Barriers For Discharge :   Consults  :  pulmonary  Procedures  :  R VATS/decortication 05/13/2016  DVT Prophylaxis  :   -SCDs , probably can start lovenox tomorrow Lab Results  Component Value Date   PLT 539 (H) 05/13/2016    Antibiotics  :  vanco 1/30=> Meropenem 1/30=> levaquin 1/30=>  Anti-infectives    Start     Dose/Rate Route Frequency Ordered Stop   05/13/16 1900  levofloxacin (LEVAQUIN) IVPB 750 mg     750 mg 100 mL/hr over 90 Minutes Intravenous Every 48 hours 05/13/16 1527     05/13/16 1630  vancomycin (VANCOCIN) IVPB 1000 mg/200 mL premix     1,000 mg 200 mL/hr over 60 Minutes Intravenous Every 12 hours 05/13/16 1527     05/12/16 0800  vancomycin (VANCOCIN) IVPB 750 mg/150 ml premix  Status:  Discontinued     750 mg 150 mL/hr over 60 Minutes Intravenous Every 12 hours 05/11/16 1838 05/13/16 1433   05/11/16 2000  meropenem (MERREM) 1 g in sodium chloride 0.9 % 100 mL IVPB  Status:  Discontinued     1 g 200 mL/hr over 30 Minutes Intravenous Every 8 hours 05/11/16 1837 05/13/16 1433   05/11/16 1900  vancomycin (VANCOCIN) 2,500 mg in sodium chloride 0.9 % 500 mL IVPB     2,500 mg 250 mL/hr over 120 Minutes Intravenous STAT 05/11/16 1836 05/11/16 2233   05/11/16 1900  levofloxacin (LEVAQUIN) IVPB 750 mg  Status:  Discontinued     750 mg 100 mL/hr over 90 Minutes Intravenous Every 24 hours 05/11/16 1836 05/13/16 1433        Objective:   Vitals:   05/13/16 1515 05/13/16 1600 05/13/16 1625 05/13/16 1700  BP:  (!) 108/56    Pulse:  87  89  Resp:  (!) 29  (!) 26  Temp: 97.5 F (36.4 C)     TempSrc: Oral      SpO2:  93% 93% 94%  Weight:      Height:        Wt Readings from Last 3 Encounters:  05/13/16 108.7 kg (239 lb 9.6 oz)  06/19/14 117.9 kg (260 lb)  02/19/12 117 kg (258 lb)     Intake/Output Summary (Last 24 hours) at 05/13/16 1718 Last data filed at 05/13/16 1700  Gross per 24 hour  Intake             2040 ml  Output             3300 ml  Net            -1260 ml     Physical Exam  Awake Alert, Oriented X 3, No new F.N deficits, Normal affect [place].AT,PERRAL Supple Neck,No JVD, No cervical lymphadenopathy appriciated.  Symmetrical Chest wall movement, Good air movement bilaterally, CTAB RRR,No Gallops,Rubs or new Murmurs, No Parasternal Heave +ve B.Sounds, Abd Soft, No tenderness, No organomegaly appriciated, No rebound - guarding or rigidity. No Cyanosis, Clubbing or edema, No new Rash or bruise    Splint on left wrist,  Chest tube to drainage Foley in place    Data Review:    CBC  Recent Labs Lab 05/11/16 1622 05/12/16 0413 05/13/16 1025  WBC 18.9* 17.2* 22.1*  HGB 9.9* 9.7*  8.9*  HCT 31.3* 30.4* 29.3*  PLT 536* 494* 539*  MCV 90.5 89.1 91.3  MCH 28.6 28.4 27.7  MCHC 31.6 31.9 30.4  RDW 14.4 14.5 14.6  LYMPHSABS 0.9  --   --   MONOABS 1.9*  --   --   EOSABS 0.0  --   --   BASOSABS 0.0  --   --     Chemistries   Recent Labs Lab 05/11/16 1622 05/12/16 0413  NA 136 135  K 4.2 4.3  CL 101 104  CO2 26 21*  GLUCOSE 139* 148*  BUN 24* 19  CREATININE 0.90 0.72  CALCIUM 8.2* 7.9*  AST 122* 114*  ALT 114* 124*  ALKPHOS 151* 150*  BILITOT 0.9 1.1   ------------------------------------------------------------------------------------------------------------------ No results for input(s): CHOL, HDL, LDLCALC, TRIG, CHOLHDL, LDLDIRECT in the last 72 hours.  No results found for: HGBA1C ------------------------------------------------------------------------------------------------------------------  Recent Labs  05/11/16 1622  TSH 0.690    ------------------------------------------------------------------------------------------------------------------ No results for input(s): VITAMINB12, FOLATE, FERRITIN, TIBC, IRON, RETICCTPCT in the last 72 hours.  Coagulation profile  Recent Labs Lab 05/11/16 1622  INR 1.18    No results for input(s): DDIMER in the last 72 hours.  Cardiac Enzymes No results for input(s): CKMB, TROPONINI, MYOGLOBIN in the last 168 hours.  Invalid input(s): CK ------------------------------------------------------------------------------------------------------------------ No results found for: BNP  Inpatient Medications  Scheduled Meds: . acetaminophen  1,000 mg Oral Q6H   Or  . acetaminophen (TYLENOL) oral liquid 160 mg/5 mL  1,000 mg Oral Q6H  . [START ON 05/14/2016] bisacodyl  10 mg Oral Daily  . [START ON 05/14/2016] enoxaparin (LOVENOX) injection  40 mg Subcutaneous Daily  . fentaNYL   Intravenous Q4H  . insulin aspart  0-24 Units Subcutaneous Q4H  . levalbuterol  0.63 mg Nebulization Q6H  . levofloxacin (LEVAQUIN) IV  750 mg Intravenous Q48H  . senna-docusate  1 tablet Oral QHS  . vancomycin  1,000 mg Intravenous Q12H   Continuous Infusions: . dextrose 5 % and 0.9 % NaCl with KCl 20 mEq/L 100 mL/hr at 05/13/16 1506   PRN Meds:.diphenhydrAMINE **OR** diphenhydrAMINE, ketorolac, naloxone **AND** sodium chloride flush, ondansetron (ZOFRAN) IV, ondansetron (ZOFRAN) IV, potassium chloride (KCL MULTIRUN) 30 mEq in 265 mL IVPB  Micro Results Recent Results (from the past 240 hour(s))  Culture, blood (routine x 2)     Status: None (Preliminary result)   Collection Time: 05/11/16  3:47 PM  Result Value Ref Range Status   Specimen Description BLOOD RIGHT ARM  Final   Special Requests BOTTLES DRAWN AEROBIC AND ANAEROBIC 10CC  Final   Culture   Final    NO GROWTH 2 DAYS Performed at [facility], [address], [place], [place] [address]    Report Status PENDING  Incomplete   Culture, blood (routine x 2)     Status: None (Preliminary result)   Collection Time: 05/11/16  3:47 PM  Result Value Ref Range Status   Specimen Description BLOOD LEFT ARM  Final   Special Requests BOTTLES DRAWN AEROBIC ONLY 5CC  Final   Culture   Final    NO GROWTH 2 DAYS Performed at [facility], [address] [place], [place] [address]    Report Status PENDING  Incomplete  Gram stain     Status: None   Collection Time: 05/13/16  8:42 AM  Result Value Ref Range Status   Specimen Description PLEURAL RIGHT  Final   Special Requests NONE  Final  Gram Stain   Final    FEW WBC PRESENT, PREDOMINANTLY PMN ABUNDANT GRAM NEGATIVE RODS ABUNDANT GRAM POSITIVE COCCI IN PAIRS ABUNDANT GRAM VARIABLE ROD    Report Status 05/13/2016 FINAL  Final    Radiology Reports Ct Chest W Contrast  Result Date: 05/11/2016 CLINICAL DATA:  Extensive right pleural effusion EXAM: CT CHEST WITH CONTRAST TECHNIQUE: Multidetector CT imaging of the chest was performed during intravenous contrast administration. CONTRAST:  75mL ISOVUE-300 IOPAMIDOL (ISOVUE-300) INJECTION 61% COMPARISON:  Chest x-ray from earlier in the same day, 04/26/2016 FINDINGS: Cardiovascular: Thoracic aorta demonstrates some calcific changes without aneurysmal dilatation or dissection. Pulmonary artery shows no large central filling defects although timing for emboli it was not performed. Cardiac structures are within normal limits. Mediastinum/Nodes: The thoracic inlet shows no acute abnormality. Scattered small lymph nodes are noted throughout the mediastinum likely of reactive nature. No sizable lymph nodes are seen. Lungs/Pleura: The left lung is within normal limits. The right hemithorax demonstrates evidence of a large loculated pleural effusion within air-fluid level most consistent with empyema. Some associated right lower lobe and right middle lobe atelectatic changes are seen. No parenchymal nodules are noted. Upper Abdomen:  Visualized upper abdomen shows a masslike density measuring 4.8 cm in the upper pole of the left kidney this is incompletely evaluated on this exam. No other focal abnormality in the upper abdomen is seen. Musculoskeletal: Bony structures show degenerative change of the thoracic spine. No acute bony abnormality is noted. IMPRESSION: Loculated pleural fluid collection with air-fluid level consistent with an empyema. Tube drainage is recommended. Suggestion of a right upper pole renal mass as described. This is incompletely evaluated on this exam. CT of the abdomen and pelvis with contrast material is recommended for further evaluation. These results will be called to the ordering clinician or representative by the Radiologist Assistant, and communication documented in the PACS or zVision Dashboard. Electronically Signed   By: Alcide Clever M.D.   On: 05/11/2016 17:33   Dg Chest Port 1 View  Result Date: 05/13/2016 CLINICAL DATA:  LEFT lung surgery.  Empyema EXAM: PORTABLE CHEST 1 VIEW COMPARISON:  Radiograph 05/11/2016 FINDINGS: Normal cardiac silhouette. Interval placement of 2 RIGHT chest tubes, 1 directed superiorly and 1 directed inferiorly. There is marked reduction in volume of the fluid collection at the RIGHT lung base with only small residual fluid and atelectasis evident by a radiograph. No visible pneumothorax. LEFT lung clear. RIGHT central venous line. IMPRESSION: Marked reduction in volume of fluid within the cavitary lesion in the RIGHT lower lobe following two chest tube placement. Electronically Signed   By: Genevive Bi M.D.   On: 05/13/2016 10:46    Time Spent in minutes  30   Pearson Grippe M.D on 05/13/2016 at 5:18 PM  Between 7am to 7am - Pager - [phone]

## 2016-05-13 NOTE — Progress Notes (Signed)
Spoke with Dr.Bartyl on the phone stating that pt's family wanted to speak with him. He stated that he [facility] but pt was not transferred until later tonight. He will speak to pt and family in the morning. Pt and family preferred to sign consent once they speak with MD. I asked MD if there were any additional orders he wanted to place. He stated that he put in NPO and consent orders in already.

## 2016-05-13 NOTE — Anesthesia Procedure Notes (Signed)
Central Venous Catheter Insertion Performed by: Val EagleMOSER, Corrado Hymon, anesthesiologist Start/End2/04/2016 7:25 AM, 05/13/2016 7:31 AM Patient location: Pre-op. Preanesthetic checklist: patient identified, IV checked, site marked, risks and benefits discussed, surgical consent, monitors and equipment checked, pre-op evaluation, timeout performed and anesthesia consent Position: supine Lidocaine 1% used for infiltration and patient sedated Hand hygiene performed  and maximum sterile barriers used  Catheter size: 8 Fr Total catheter length 16. Central line was placed.Double lumen Procedure performed using ultrasound guided technique. Ultrasound Notes:image(s) printed for medical record Attempts: 1 Following insertion, dressing applied, line sutured and Biopatch. Post procedure assessment: blood return through all ports, free fluid flow and no air  Patient tolerated the procedure well with no immediate complications.

## 2016-05-13 NOTE — Brief Op Note (Signed)
05/11/2016 - 05/13/2016  10:00 AM  PATIENT:  John [name]  61 y.o. male  PRE-OPERATIVE DIAGNOSIS:  RIGHT EMPYEMA   POST-OPERATIVE DIAGNOSIS:  Right empyema  PROCEDURE:  Procedure(s): VIDEO ASSISTED THORACOSCOPY (VATS)/DRAIN EMPYEMA (Right), Decortication.  SURGEON:  Surgeon(s) and Role:    * Alleen BorneBryan K Bartle, MD - Primary  PHYSICIAN ASSISTANT: Doree Fudgeonielle Zimmerman, PA-C  Findings: 1800 cc of foul-smelling tan-colored purulent fluid in chest. ANESTHESIA:   general  EBL:  Total I/O In: 1650 [I.V.:1150; IV Piggyback:500] Out: 1950 [Urine:150; Other:1700; Blood:100]  BLOOD ADMINISTERED:none  DRAINS: 2  Chest Tube(s) in the right pleural space   LOCAL MEDICATIONS USED:  NONE  SPECIMEN:  Source of Specimen:  purulent right pleural fluid  DISPOSITION OF SPECIMEN:  micro  COUNTS:  YES  TOURNIQUET:  * No tourniquets in log *  DICTATION: .Note written in EPIC  PLAN OF CARE: Admit to inpatient   PATIENT DISPOSITION:  PACU - hemodynamically stable.   Delay start of Pharmacological VTE agent (>24hrs) due to surgical blood loss or risk of bleeding: yes

## 2016-05-13 NOTE — Brief Op Note (Addendum)
05/11/2016 - 05/13/2016  9:56 AM  PATIENT:  John [name]  61 y.o. male  PRE-OPERATIVE DIAGNOSIS:  Right empyema  POST-OPERATIVE DIAGNOSIS:  Right empyema  PROCEDURE:  RIGHT VIDEO ASSISTED THORACOSCOPY (VATS),DRAIN EMPYEMA and LOCULATED PLEURAL EFFUSION  FINDINGS: 1850 cc of foul smelling light brown right pleural fluid removed  SURGEON:  Surgeon(s) and Role:    * Alleen BorneBryan K Bartle, MD - Primary  PHYSICIAN ASSISTANT: Doree Fudgeonielle Zimmerman PA-C  ANESTHESIA:   general  EBL:  Total I/O In: 1250 [I.V.:750; IV Piggyback:500] Out: 1950 [Urine:150; Other:1700; Blood:100]  BLOOD ADMINISTERED:none  DRAINS: 32 and 36 French chest tubes placed in the right pleural space   SPECIMEN:  Source of Specimen:  Right pleural fluid  DISPOSITION OF SPECIMEN:  Culture, gram stain, pathology  COUNTS CORRECT:  YES  DICTATION: .Dragon Dictation  PLAN OF CARE: Admit to inpatient   PATIENT DISPOSITION: PACU and stable.   Delay start of Pharmacological VTE agent (>24hrs) due to surgical blood loss or risk of bleeding: yes

## 2016-05-14 ENCOUNTER — Inpatient Hospital Stay (HOSPITAL_COMMUNITY): Payer: 59

## 2016-05-14 ENCOUNTER — Encounter (HOSPITAL_COMMUNITY): Payer: Self-pay | Admitting: Surgery

## 2016-05-14 DIAGNOSIS — L4050 Arthropathic psoriasis, unspecified: Secondary | ICD-10-CM

## 2016-05-14 LAB — GLUCOSE, CAPILLARY
Glucose-Capillary: 143 mg/dL — ABNORMAL HIGH (ref 65–99)
Glucose-Capillary: 134 mg/dL — ABNORMAL HIGH (ref 65–99)

## 2016-05-14 LAB — BASIC METABOLIC PANEL
ANION GAP: 8 (ref 5–15)
BUN: 18 mg/dL (ref 6–20)
CALCIUM: 7.7 mg/dL — AB (ref 8.9–10.3)
CO2: 23 mmol/L (ref 22–32)
Chloride: 105 mmol/L (ref 101–111)
Creatinine, Ser: 0.88 mg/dL (ref 0.61–1.24)
GFR calc Af Amer: >60 mL/min (ref >60)
GFR calc non Af Amer: >60 mL/min (ref >60)
GLUCOSE: 152 mg/dL — AB (ref 65–99)
POTASSIUM: 4.1 mmol/L (ref 3.5–5.1)
Sodium: 136 mmol/L (ref 135–145)

## 2016-05-14 LAB — HEPATIC FUNCTION PANEL
ALT: 85 U/L — ABNORMAL HIGH (ref 17–63)
AST: 64 U/L — AB (ref 15–41)
Albumin: 1.9 g/dL — ABNORMAL LOW (ref 3.5–5.0)
Alkaline Phosphatase: 106 U/L (ref 38–126)
BILIRUBIN DIRECT: 0.3 mg/dL (ref 0.1–0.5)
BILIRUBIN TOTAL: 0.7 mg/dL (ref 0.3–1.2)
Indirect Bilirubin: 0.4 mg/dL (ref 0.3–0.9)
Total Protein: 5.1 g/dL — ABNORMAL LOW (ref 6.5–8.1)

## 2016-05-14 LAB — CBC
HEMATOCRIT: 27.2 % — AB (ref 39.0–52.0)
Hemoglobin: 8.3 g/dL — ABNORMAL LOW (ref 13.0–17.0)
MCH: 27.9 pg (ref 26.0–34.0)
MCHC: 30.5 g/dL (ref 30.0–36.0)
MCV: 91.3 fL (ref 78.0–100.0)
Platelets: 397 10*3/uL (ref 150–400)
RBC: 2.98 MIL/uL — ABNORMAL LOW (ref 4.22–5.81)
RDW: 14.7 % (ref 11.5–15.5)
WBC: 13.2 10*3/uL — AB (ref 4.0–10.5)

## 2016-05-14 LAB — QUANTIFERON IN TUBE
QFT TB AG MINUS NIL VALUE: 0.01 IU/mL
QUANTIFERON MITOGEN VALUE: 0.06 IU/mL
QUANTIFERON NIL VALUE: 0.02 [IU]/mL
QUANTIFERON TB AG VALUE: 0.03 [IU]/mL
QUANTIFERON TB GOLD: UNDETERMINED

## 2016-05-14 LAB — POCT I-STAT 3, ART BLOOD GAS (G3+)
BICARBONATE: 25.2 mmol/L (ref 20.0–28.0)
O2 Saturation: 97 %
PCO2 ART: 40.6 mmHg (ref 32.0–48.0)
PO2 ART: 93 mmHg (ref 83.0–108.0)
Patient temperature: 98.3
TCO2: 26 mmol/L (ref 0–100)
pH, Arterial: 7.399 (ref 7.350–7.450)

## 2016-05-14 LAB — ACID FAST SMEAR (AFB): ACID FAST SMEAR - AFSCU2: NEGATIVE

## 2016-05-14 LAB — QUANTIFERON TB GOLD ASSAY (BLOOD)

## 2016-05-14 MED ORDER — CARVEDILOL 3.125 MG PO TABS
3.1250 mg | ORAL_TABLET | Freq: Two times a day (BID) | ORAL | Status: DC
  Administered 2016-05-14 – 2016-05-19 (×9): 3.125 mg via ORAL
  Filled 2016-05-14 (×9): qty 1

## 2016-05-14 MED ORDER — BUPROPION HCL ER (XL) 300 MG PO TB24
300.0000 mg | ORAL_TABLET | Freq: Every day | ORAL | Status: DC
  Administered 2016-05-14 – 2016-05-19 (×6): 300 mg via ORAL
  Filled 2016-05-14 (×2): qty 1
  Filled 2016-05-14: qty 2
  Filled 2016-05-14: qty 1
  Filled 2016-05-14: qty 2
  Filled 2016-05-14: qty 1

## 2016-05-14 MED ORDER — CLINDAMYCIN PHOSPHATE 600 MG/50ML IV SOLN
600.0000 mg | Freq: Three times a day (TID) | INTRAVENOUS | Status: DC
  Administered 2016-05-14 – 2016-05-15 (×3): 600 mg via INTRAVENOUS
  Filled 2016-05-14 (×3): qty 50

## 2016-05-14 MED ORDER — FLUOXETINE HCL 20 MG PO CAPS
60.0000 mg | ORAL_CAPSULE | Freq: Every day | ORAL | Status: DC
  Administered 2016-05-14 – 2016-05-19 (×6): 60 mg via ORAL
  Filled 2016-05-14 (×6): qty 3

## 2016-05-14 MED ORDER — LEVALBUTEROL HCL 0.63 MG/3ML IN NEBU: 0.6300 mg | INHALATION_SOLUTION | RESPIRATORY_TRACT | Status: DC | PRN

## 2016-05-14 MED ORDER — LEVOFLOXACIN IN D5W 750 MG/150ML IV SOLN
750.0000 mg | INTRAVENOUS | Status: DC
  Administered 2016-05-14 – 2016-05-18 (×5): 750 mg via INTRAVENOUS
  Filled 2016-05-14 (×7): qty 150

## 2016-05-14 NOTE — Progress Notes (Signed)
PCCM Consult Note  Admission date: 05/11/2016 Consult date: 05/12/2016 Referring provider: Dr. Selena Batten, [facility]  CC: Short of breath  HPI: 61 yo male with remote hx of smoking developed cough in December 2017, pleuritic pain in Rt chest. - found to have Loculated Rt pleural effusion He has hx of psoriasis with arthritis and is on MTX. Underwent VATS drainage Rt  He is from [place], but has lived in [place] for 35 yrs.  He denies hx of TB.  He denies recent travel or sick exposures.  He reports hx of PCN reaction as a child >> got a rash.  He has taken PCN derivatives subsequently w/o difficulty.  ROS: Some pain at chest tube sites Breathing ok  Vital signs: BP 113/80   Pulse 82   Temp 98.1 F (36.7 C) (Oral)   Resp (!) 26   Ht 5\' 11"  (1.803 m)   Wt 243 lb 13.3 oz (110.6 kg)   SpO2 97%   BMI 34.01 kg/m   Intake/output: I/O last 3 completed shifts: In: 3980 [P.O.:240; I.V.:2740; IV Piggyback:1000] Out: 3940 [Urine:1450; Other:1700; Blood:100; Chest Tube:690]  General: up in chair Neuro: alert, CN intact Eyes: pupils reactive ENT: no sinus tenderness, no oral exudate, no LAN Cardiac: regular, no murmur Chest: decreased BS with dullness on Rt base, getting neb Rx Abd: soft, non tender, + bowel sounds Ext: no edema Skin: change of psoriasis in nail beds   CMP Latest Ref Rng & Units 05/14/2016 05/12/2016 05/11/2016  Glucose 65 - 99 mg/dL 161(W) 960(A) 540(J)  BUN 6 - 20 mg/dL 18 19 81(X)  Creatinine 0.61 - 1.24 mg/dL 9.14 7.82 9.56  Sodium 135 - 145 mmol/L 136 135 136  Potassium 3.5 - 5.1 mmol/L 4.1 4.3 4.2  Chloride 101 - 111 mmol/L 105 104 101  CO2 22 - 32 mmol/L 23 21(L) 26  Calcium 8.9 - 10.3 mg/dL 7.7(L) 7.9(L) 8.2(L)  Total Protein 6.5 - 8.1 g/dL 5.1(L) 6.4(L) 6.9  Total Bilirubin 0.3 - 1.2 mg/dL 0.7 1.1 0.9  Alkaline Phos 38 - 126 U/L 106 150(H) 151(H)  AST 15 - 41 U/L 64(H) 114(H) 122(H)  ALT 17 - 63 U/L 85(H) 124(H) 114(H)     CBC Latest Ref Rng & Units 05/14/2016  05/13/2016 05/12/2016  WBC 4.0 - 10.5 K/uL 13.2(H) 22.1(H) 17.2(H)  Hemoglobin 13.0 - 17.0 g/dL 8.3(L) 8.9(L) 9.7(L)  Hematocrit 39.0 - 52.0 % 27.2(L) 29.3(L) 30.4(L)  Platelets 150 - 400 K/uL 397 539(H) 494(H)     ABG    Component Value Date/Time   PHART 7.399 05/14/2016 0413   PCO2ART 40.6 05/14/2016 0413   PO2ART 93.0 05/14/2016 0413   HCO3 25.2 05/14/2016 0413   TCO2 26 05/14/2016 0413   O2SAT 97.0 05/14/2016 0413     CBG (last 3)   Recent Labs  05/13/16 2342 05/14/16 0347 05/14/16 0749  GLUCAP 144* 143* 134*     Imaging: Dg Chest Port 1 View  Result Date: 05/14/2016 CLINICAL DATA:  Empyema, chest 2 EXAM: PORTABLE CHEST 1 VIEW COMPARISON:  Chest radiograph from one day prior. FINDINGS: Superior most right chest tube terminates over the mid to upper right pleural space. Basilar right chest tube is in place. Right internal jugular central venous catheter terminates in the upper third of the superior vena cava. Stable cardiomediastinal silhouette with normal heart size. No pneumothorax. Stable mild pleural thickening in the peripheral right pleural space. No left pleural effusion. No overt pulmonary edema. Stable low lung volumes. Stable patchy opacity  in the mid to lower right lung. Mild left basilar atelectasis. Subcutaneous emphysema in the lateral lower right chest wall. IMPRESSION: 1. No pneumothorax. Right chest tubes in place. Stable peripheral right pleural thickening. 2. Stable low lung volumes. Stable patchy mid to lower right lung opacity and mild left basilar atelectasis. Electronically Signed   By: Delbert PhenixJason A Poff M.D.   On: 05/14/2016 07:36   Dg Chest Port 1 View  Result Date: 05/13/2016 CLINICAL DATA:  LEFT lung surgery.  Empyema EXAM: PORTABLE CHEST 1 VIEW COMPARISON:  Radiograph 05/11/2016 FINDINGS: Normal cardiac silhouette. Interval placement of 2 RIGHT chest tubes, 1 directed superiorly and 1 directed inferiorly. There is marked reduction in volume of the fluid  collection at the RIGHT lung base with only small residual fluid and atelectasis evident by a radiograph. No visible pneumothorax. LEFT lung clear. RIGHT central venous line. IMPRESSION: Marked reduction in volume of fluid within the cavitary lesion in the RIGHT lower lobe following two chest tube placement. Electronically Signed   By: Genevive BiStewart  Edmunds M.D.   On: 05/13/2016 10:46     Studies: CT chest 1/30 >> loculated pleural effusion on Rt with air fluid level, 4.8 cm mass upper pole Rt kidney  Cultures: Blood 1/30 >> Quantiferon Gold 1/31 >> HIV 1/31 >> neg Pleural >> GNR , GPC >>  Antibiotics: Levaquin 1/30 >> Meropenem 1/30 >> Vancomycin 1/30 >.     Summary: 61 yo male with  Rt  Empyema s/p VATS drainage of pus.  He has hx of psoriatic arthritis and is on MTX as outpt.  Assessment/plan:  - FU cultures -ct Abx meantime -chest tubes per TCTS - Will need abx for 2-4 weeks depending on resolution -Xopenex nebs prn, albuterol mDI on discharge   PCCM available as needed Outpt FU will be with TCTS  Cyril Mourningakesh Alva MD. FCCP. [facility] Pager [phone] If no response call [phone]   05/14/2016

## 2016-05-14 NOTE — Progress Notes (Signed)
Patient ID: John [name], male   DOB: 07/12/1955, 61 y.o.   MRN: [id number]                                                                PROGRESS NOTE                                                                                                                                                                                                             Patient Demographics:    John [name], is a 61 y.o. male, DOB - 03/08/1956, WUJ:[id number]  Admit date - 05/11/2016   Admitting Physician Pearson GrippeJames Mckinzee Spirito, MD  Outpatient Primary MD for the patient is Pearson GrippeJames Sreenidhi Ganson, MD  LOS - 3  Outpatient Specialists:  No chief complaint on file.      Brief Narrative  60 y.o.male,w psoriasis c/o cough since December 2017, Pt was seen in ER and tx with omnicef and zithromax. Pt then continued to have cough and seen in office, and tx with. Pt presented today due to worsening dyspnea and was found to have right sided pleural effusion on CXR. Pt sent to hospital for direct admission. Pt has not taken methotrexate for the past 2 weeks due to feeling poorly.   [facility] which shows a loculated pleural effusion. Pt has been afebrile, CT scan also showed possible right renal mass incompletely visualized on CT chest. Pt will be admitted for dyspnea from pleural effusion ? Empyema. Ultrasound guided thoracentesis has been ordered.  Discussed case with pulmonary who recommended IV abx. Pt seen by CT surgery who recommended VATS and decortication of empyema   Subjective:    John [name] today is sitting in chair,  Doing well.  Dyspnea improved.  Afebrile overnite.     Assessment  & Plan :    Active Problems:   Dyspnea   Pleural effusion   Psoriatic arthritis (HCC)   Empyema (HCC)   1. R pleural effusion/empyema s/p VATS/ decortication 2/1 Awaiting cultures Cont iv abx, vanco, meropenem, levaquin Appreciate CT surgery and pulmonary input  2. R renal lesion CT scan abd/  pelvis when more stable  3. Psoriasis Methotrexate on hold  4. Hypertension Cont current bp medications.   5. Abnormal liver function improving Check cmp in am  6. Anemia Repeat cbc in am   Code Status:FULL CODE  Family Communication :w patient and wife  Disposition Plan:home  Barriers For Discharge:  Consults :pulmonary, CT surgery  Procedures : R VATS/decortication 05/13/2016  DVT Prophylaxis: -SCDs,lovenox Antibiotics  :  vanco 1/30=> Meropenem 1/30=> levaquin 1/30=>      Lab Results  Component Value Date   PLT 397 05/14/2016      Anti-infectives    Start     Dose/Rate Route Frequency Ordered Stop   05/13/16 1900  levofloxacin (LEVAQUIN) IVPB 750 mg     750 mg 100 mL/hr over 90 Minutes Intravenous Every 48 hours 05/13/16 1527     05/13/16 1630  vancomycin (VANCOCIN) IVPB 1000 mg/200 mL premix     1,000 mg 200 mL/hr over 60 Minutes Intravenous Every 12 hours 05/13/16 1527     05/12/16 0800  vancomycin (VANCOCIN) IVPB 750 mg/150 ml premix  Status:  Discontinued     750 mg 150 mL/hr over 60 Minutes Intravenous Every 12 hours 05/11/16 1838 05/13/16 1433   05/11/16 2000  meropenem (MERREM) 1 g in sodium chloride 0.9 % 100 mL IVPB  Status:  Discontinued     1 g 200 mL/hr over 30 Minutes Intravenous Every 8 hours 05/11/16 1837 05/13/16 1433   05/11/16 1900  vancomycin (VANCOCIN) 2,500 mg in sodium chloride 0.9 % 500 mL IVPB     2,500 mg 250 mL/hr over 120 Minutes Intravenous STAT 05/11/16 1836 05/11/16 2233   05/11/16 1900  levofloxacin (LEVAQUIN) IVPB 750 mg  Status:  Discontinued     750 mg 100 mL/hr over 90 Minutes Intravenous Every 24 hours 05/11/16 1836 05/13/16 1433        Objective:   Vitals:   05/14/16 0425 05/14/16 0500 05/14/16 0600 05/14/16 0700  BP:  114/60  113/80  Pulse:  81  82  Resp:  19 (!) 28 17  Temp:      TempSrc:      SpO2: 98% 97%  97%  Weight:  110.6 kg (243 lb 13.3 oz)    Height:         Wt Readings from Last 3 Encounters:  05/14/16 110.6 kg (243 lb 13.3 oz)  06/19/14 117.9 kg (260 lb)  02/19/12 117 kg (258 lb)     Intake/Output Summary (Last 24 hours) at 05/14/16 0728 Last data filed at 05/14/16 0400  Gross per 24 hour  Intake             3680 ml  Output             3400 ml  Net              280 ml     Physical Exam  Awake Alert, Oriented X 3, No new F.N deficits, Normal affect [place].AT,PERRAL Supple Neck,No JVD, No cervical lymphadenopathy appriciated.  Symmetrical Chest wall movement, Good air movement bilaterally, CTAB RRR,No Gallops,Rubs or new Murmurs, No Parasternal Heave +ve B.Sounds, Abd Soft, No tenderness, No organomegaly appriciated, No rebound - guarding or rigidity. No Cyanosis, Clubbing or edema, No new Rash or bruise   Foley in place Chest tube in place    Data Review:    CBC  Recent Labs Lab 05/11/16 1622 05/12/16 0413 05/13/16 1025 05/14/16 0422  WBC 18.9* 17.2* 22.1* 13.2*  HGB 9.9* 9.7* 8.9* 8.3*  HCT 31.3* 30.4* 29.3* 27.2*  PLT 536* 494* 539* 397  MCV 90.5 89.1 91.3 91.3  MCH 28.6 28.4 27.7 27.9  MCHC 31.6 31.9 30.4 30.5  RDW 14.4 14.5 14.6 14.7  LYMPHSABS 0.9  --   --   --   MONOABS 1.9*  --   --   --   EOSABS 0.0  --   --   --   BASOSABS 0.0  --   --   --     Chemistries   Recent Labs Lab 05/11/16 1622 05/12/16 0413 05/14/16 0422  NA 136 135 136  K 4.2 4.3 4.1  CL 101 104 105  CO2 26 21* 23  GLUCOSE 139* 148* 152*  BUN 24* 19 18  CREATININE 0.90 0.72 0.88  CALCIUM 8.2* 7.9* 7.7*  AST 122* 114* 64*  ALT 114* 124* 85*  ALKPHOS 151* 150* 106  BILITOT 0.9 1.1 0.7   ------------------------------------------------------------------------------------------------------------------ No results for input(s): CHOL, HDL, LDLCALC, TRIG, CHOLHDL, LDLDIRECT in the last 72 hours.  No results found for:  HGBA1C ------------------------------------------------------------------------------------------------------------------  Recent Labs  05/11/16 1622  TSH 0.690   ------------------------------------------------------------------------------------------------------------------ No results for input(s): VITAMINB12, FOLATE, FERRITIN, TIBC, IRON, RETICCTPCT in the last 72 hours.  Coagulation profile  Recent Labs Lab 05/11/16 1622  INR 1.18    No results for input(s): DDIMER in the last 72 hours.  Cardiac Enzymes No results for input(s): CKMB, TROPONINI, MYOGLOBIN in the last 168 hours.  Invalid input(s): CK ------------------------------------------------------------------------------------------------------------------ No results found for: BNP  Inpatient Medications  Scheduled Meds: . acetaminophen  1,000 mg Oral Q6H   Or  . acetaminophen (TYLENOL) oral liquid 160 mg/5 mL  1,000 mg Oral Q6H  . bisacodyl  10 mg Oral Daily  . enoxaparin (LOVENOX) injection  40 mg Subcutaneous Daily  . fentaNYL   Intravenous Q4H  . insulin aspart  0-24 Units Subcutaneous Q4H  . levalbuterol  0.63 mg Nebulization Q6H  . levofloxacin (LEVAQUIN) IV  750 mg Intravenous Q48H  . senna-docusate  1 tablet Oral QHS  . vancomycin  1,000 mg Intravenous Q12H   Continuous Infusions: . dextrose 5 % and 0.9 % NaCl with KCl 20 mEq/L 100 mL/hr at 05/14/16 0358   PRN Meds:.diphenhydrAMINE **OR** diphenhydrAMINE, ketorolac, naloxone **AND** sodium chloride flush, ondansetron (ZOFRAN) IV, ondansetron (ZOFRAN) IV, potassium chloride (KCL MULTIRUN) 30 mEq in 265 mL IVPB  Micro Results Recent Results (from the past 240 hour(s))  Culture, blood (routine x 2)     Status: None (Preliminary result)   Collection Time: 05/11/16  3:47 PM  Result Value Ref Range Status   Specimen Description BLOOD RIGHT ARM  Final   Special Requests BOTTLES DRAWN AEROBIC AND ANAEROBIC 10CC  Final   Culture   Final    NO GROWTH 2  DAYS Performed at [facility], [address] [place], [place] [address]    Report Status PENDING  Incomplete  Culture, blood (routine x 2)     Status: None (Preliminary result)   Collection Time: 05/11/16  3:47 PM  Result Value Ref Range Status   Specimen Description BLOOD LEFT ARM  Final   Special Requests BOTTLES DRAWN AEROBIC ONLY 5CC  Final   Culture   Final    NO GROWTH 2 DAYS Performed at [facility], [address], [place], [place] [address]    Report Status PENDING  Incomplete  Gram stain     Status: None   Collection Time: 05/13/16  8:42 AM  Result Value Ref Range Status   Specimen Description PLEURAL RIGHT  Final   Special Requests NONE  Final  Gram Stain   Final    FEW WBC PRESENT, PREDOMINANTLY PMN ABUNDANT GRAM NEGATIVE RODS ABUNDANT GRAM POSITIVE COCCI IN PAIRS ABUNDANT GRAM VARIABLE ROD    Report Status 05/13/2016 FINAL  Final  MRSA PCR Screening     Status: None   Collection Time: 05/13/16  2:54 PM  Result Value Ref Range Status   MRSA by PCR NEGATIVE NEGATIVE Final    Comment:        The GeneXpert MRSA Assay (FDA approved for NASAL specimens only), is one component of a comprehensive MRSA colonization surveillance program. It is not intended to diagnose MRSA infection nor to guide or monitor treatment for MRSA infections.     Radiology Reports Ct Chest W Contrast  Result Date: 05/11/2016 CLINICAL DATA:  Extensive right pleural effusion EXAM: CT CHEST WITH CONTRAST TECHNIQUE: Multidetector CT imaging of the chest was performed during intravenous contrast administration. CONTRAST:  75mL ISOVUE-300 IOPAMIDOL (ISOVUE-300) INJECTION 61% COMPARISON:  Chest x-ray from earlier in the same day, 04/26/2016 FINDINGS: Cardiovascular: Thoracic aorta demonstrates some calcific changes without aneurysmal dilatation or dissection. Pulmonary artery shows no large central filling defects although timing for emboli it was not performed. Cardiac structures  are within normal limits. Mediastinum/Nodes: The thoracic inlet shows no acute abnormality. Scattered small lymph nodes are noted throughout the mediastinum likely of reactive nature. No sizable lymph nodes are seen. Lungs/Pleura: The left lung is within normal limits. The right hemithorax demonstrates evidence of a large loculated pleural effusion within air-fluid level most consistent with empyema. Some associated right lower lobe and right middle lobe atelectatic changes are seen. No parenchymal nodules are noted. Upper Abdomen: Visualized upper abdomen shows a masslike density measuring 4.8 cm in the upper pole of the left kidney this is incompletely evaluated on this exam. No other focal abnormality in the upper abdomen is seen. Musculoskeletal: Bony structures show degenerative change of the thoracic spine. No acute bony abnormality is noted. IMPRESSION: Loculated pleural fluid collection with air-fluid level consistent with an empyema. Tube drainage is recommended. Suggestion of a right upper pole renal mass as described. This is incompletely evaluated on this exam. CT of the abdomen and pelvis with contrast material is recommended for further evaluation. These results will be called to the ordering clinician or representative by the Radiologist Assistant, and communication documented in the PACS or zVision Dashboard. Electronically Signed   By: Alcide Clever M.D.   On: 05/11/2016 17:33   Dg Chest Port 1 View  Result Date: 05/13/2016 CLINICAL DATA:  LEFT lung surgery.  Empyema EXAM: PORTABLE CHEST 1 VIEW COMPARISON:  Radiograph 05/11/2016 FINDINGS: Normal cardiac silhouette. Interval placement of 2 RIGHT chest tubes, 1 directed superiorly and 1 directed inferiorly. There is marked reduction in volume of the fluid collection at the RIGHT lung base with only small residual fluid and atelectasis evident by a radiograph. No visible pneumothorax. LEFT lung clear. RIGHT central venous line. IMPRESSION: Marked  reduction in volume of fluid within the cavitary lesion in the RIGHT lower lobe following two chest tube placement. Electronically Signed   By: Genevive Bi M.D.   On: 05/13/2016 10:46    Time Spent in minutes  30   Pearson Grippe M.D on 05/14/2016 at 7:28 AM  Between 7am to 7am - Pager - [phone]

## 2016-05-14 NOTE — Care Management Note (Signed)
Case Management Note  Patient Details  Name: John [name] MRN: [id number] Date of Birth: 05/18/1955  Subjective/Objective:      Pt is [name]/p TAVR              Action/Plan:  PTA independent from home with wife.  CM will continue to follow for discharge needs   Expected Discharge Date:   (unknown)               Expected Discharge Plan:  Home/Self Care  In-House Referral:     Discharge planning Services  CM Consult  Post Acute Care Choice:    Choice offered to:     DME Arranged:    DME Agency:     HH Arranged:    HH Agency:     Status of Service:  In process, will continue to follow  If discussed at Long Length of Stay Meetings, dates discussed:    Additional Comments:  John [name], John Ackerley S, RN 05/14/2016, 10:01 AM

## 2016-05-14 NOTE — Plan of Care (Signed)
Problem: Activity: Goal: Risk for activity intolerance will decrease Outcome: Progressing Pt dangled x2. To be OOB in the AM  Problem: Cardiac: Goal: Hemodynamic stability will improve Outcome: Progressing Within parameters  Problem: Respiratory: Goal: Respiratory status will improve Outcome: Progressing On 4LNC Goal: Chest tube patency will be maintained Outcome: Progressing Chest tube patent with no air leak  Problem: Pain Management: Goal: Pain level will decrease Outcome: Progressing Minimal c/o pain

## 2016-05-14 NOTE — Progress Notes (Signed)
CT surgery p.m. Rounds  Patient name related hallway today No airleak minimal serosanguineous drainage Operative cultures show anaerobic gram-positive cocci in pairs Add clindamycin to current antibiotics-patient allergic to piperacillin and meropenem

## 2016-05-14 NOTE — Progress Notes (Signed)
1 Day Post-Op Procedure(s) (LRB): VIDEO ASSISTED THORACOSCOPY (VATS)/DRAIN EMPYEMA (Right) Subjective: No complaints. Pain controlled  Objective: Vital signs in last 24 hours: Temp:  [97.5 F (36.4 C)-98.3 F (36.8 C)] 98.1 F (36.7 C) (02/02 0700) Pulse Rate:  [79-89] 82 (02/02 0700) Cardiac Rhythm: Normal sinus rhythm (02/02 0400) Resp:  [14-29] 17 (02/02 0700) BP: (96-138)/(40-100) 113/80 (02/02 0700) SpO2:  [90 %-98 %] 97 % (02/02 0700) Arterial Line BP: (110-151)/(44-59) 145/54 (02/02 0700) Weight:  [110.6 kg (243 lb 13.3 oz)] 110.6 kg (243 lb 13.3 oz) (02/02 0500)  Hemodynamic parameters for last 24 hours:    Intake/Output from previous day: 02/01 0701 - 02/02 0700 In: 3980 [P.O.:240; I.V.:2740; IV Piggyback:1000] Out: 3540 [Urine:1050; Blood:100; Chest Tube:690] Intake/Output this shift: No intake/output data recorded.  General appearance: alert and cooperative Heart: regular rate and rhythm, S1, S2 normal, no murmur, click, rub or gallop Lungs: clear to auscultation bilaterally Extremities: extremities normal, atraumatic, no cyanosis or edema Wound: dressings dry Chest tube output serosanguinous, no air leak  Lab Results:  Recent Labs  05/13/16 1025 05/14/16 0422  WBC 22.1* 13.2*  HGB 8.9* 8.3*  HCT 29.3* 27.2*  PLT 539* 397   BMET:  Recent Labs  05/12/16 0413 05/14/16 0422  NA 135 136  K 4.3 4.1  CL 104 105  CO2 21* 23  GLUCOSE 148* 152*  BUN 19 18  CREATININE 0.72 0.88  CALCIUM 7.9* 7.7*    PT/INR:  Recent Labs  05/11/16 1622  LABPROT 15.0  INR 1.18   ABG    Component Value Date/Time   PHART 7.399 05/14/2016 0413   HCO3 25.2 05/14/2016 0413   TCO2 26 05/14/2016 0413   O2SAT 97.0 05/14/2016 0413   CBG (last 3)   Recent Labs  05/13/16 2342 05/14/16 0347 05/14/16 0749  GLUCAP 144* 143* 134*   CLINICAL DATA:  Empyema, chest 2  EXAM: PORTABLE CHEST 1 VIEW  COMPARISON:  Chest radiograph from one day  prior.  FINDINGS: Superior most right chest tube terminates over the mid to upper right pleural space. Basilar right chest tube is in place. Right internal jugular central venous catheter terminates in the upper third of the superior vena cava. Stable cardiomediastinal silhouette with normal heart size. No pneumothorax. Stable mild pleural thickening in the peripheral right pleural space. No left pleural effusion. No overt pulmonary edema. Stable low lung volumes. Stable patchy opacity in the mid to lower right lung. Mild left basilar atelectasis. Subcutaneous emphysema in the lateral lower right chest wall.  IMPRESSION: 1. No pneumothorax. Right chest tubes in place. Stable peripheral right pleural thickening. 2. Stable low lung volumes. Stable patchy mid to lower right lung opacity and mild left basilar atelectasis.   Electronically Signed   By: Delbert PhenixJason A Poff M.D.   On: 05/14/2016 07:36  Assessment/Plan: S/P Procedure(s) (LRB): VIDEO ASSISTED THORACOSCOPY (VATS)/DRAIN EMPYEMA (Right)  He is hemodynamically stable in sinus rhythm. Will resume low dose Coreg.  Pulmonary status stable. Breathing much easier than preop. Continue bronchodilators, broad spectrum antibiotics pending culture results. IS, ambulation  Gram stain of pleural peel shows abundant G- rods and G+ cocci and G variable rods. This was pus. Follow up on cultures.  Continue chest tubes to suction.  DC foley and arterial line.   LOS: 3 days    Alleen BorneBryan K Yonas Bunda 05/14/2016

## 2016-05-14 NOTE — Progress Notes (Signed)
Pt found sitting in floor by nursing staff. Stated "I wanted to get back to bed. I called and I got up and found myself tangled so I just sat down." Pt did not report any pain. Pt does not report hitting his head. No cuts, lesions, or bruises assessed on patient. Central line and chest tubes in the same position. MD notified of fall. No new orders received. Pt educated on the importance of waiting until hospital staff are in the room to help before getting up and calling for assistance. Verbalized understanding. Will continue to monitor pt closely.

## 2016-05-15 ENCOUNTER — Inpatient Hospital Stay (HOSPITAL_COMMUNITY): Payer: 59

## 2016-05-15 LAB — CBC
HCT: 26.1 % — ABNORMAL LOW (ref 39.0–52.0)
Hemoglobin: 8.1 g/dL — ABNORMAL LOW (ref 13.0–17.0)
MCH: 28.3 pg (ref 26.0–34.0)
MCHC: 31 g/dL (ref 30.0–36.0)
MCV: 91.3 fL (ref 78.0–100.0)
PLATELETS: 388 10*3/uL (ref 150–400)
RBC: 2.86 MIL/uL — ABNORMAL LOW (ref 4.22–5.81)
RDW: 14.7 % (ref 11.5–15.5)
WBC: 9.8 10*3/uL (ref 4.0–10.5)

## 2016-05-15 LAB — COMPREHENSIVE METABOLIC PANEL
ALK PHOS: 98 U/L (ref 38–126)
ALT: 74 U/L — AB (ref 17–63)
ANION GAP: 4 — AB (ref 5–15)
AST: 55 U/L — ABNORMAL HIGH (ref 15–41)
Albumin: 1.7 g/dL — ABNORMAL LOW (ref 3.5–5.0)
BUN: 19 mg/dL (ref 6–20)
CALCIUM: 7.8 mg/dL — AB (ref 8.9–10.3)
CHLORIDE: 109 mmol/L (ref 101–111)
CO2: 25 mmol/L (ref 22–32)
CREATININE: 1.01 mg/dL (ref 0.61–1.24)
GFR calc non Af Amer: >60 mL/min (ref >60)
Glucose, Bld: 120 mg/dL — ABNORMAL HIGH (ref 65–99)
Potassium: 4.2 mmol/L (ref 3.5–5.1)
SODIUM: 138 mmol/L (ref 135–145)
Total Bilirubin: 0.3 mg/dL (ref 0.3–1.2)
Total Protein: 4.9 g/dL — ABNORMAL LOW (ref 6.5–8.1)

## 2016-05-15 LAB — GLUCOSE, CAPILLARY: GLUCOSE-CAPILLARY: 138 mg/dL — AB (ref 65–99)

## 2016-05-15 MED ORDER — CLINDAMYCIN PHOSPHATE 600 MG/50ML IV SOLN
600.0000 mg | Freq: Three times a day (TID) | INTRAVENOUS | Status: DC
  Administered 2016-05-15 – 2016-05-19 (×12): 600 mg via INTRAVENOUS
  Filled 2016-05-15 (×12): qty 50

## 2016-05-15 MED ORDER — SACCHAROMYCES BOULARDII 250 MG PO CAPS
250.0000 mg | ORAL_CAPSULE | Freq: Two times a day (BID) | ORAL | Status: DC
  Administered 2016-05-15 – 2016-05-19 (×8): 250 mg via ORAL
  Filled 2016-05-15 (×8): qty 1

## 2016-05-15 NOTE — Progress Notes (Signed)
Patient ID: John [name], male   DOB: 01/17/1956, 61 y.o.   MRN: [id number]                                                                PROGRESS NOTE                                                                                                                                                                                                             Patient Demographics:    John [name], is a 61 y.o. male, DOB - Sep 27, 1955, [id number]:[id number]  Admit date - 05/11/2016   Admitting Physician Pearson Grippe, MD  Outpatient Primary MD for the patient is Pearson Grippe, MD  LOS - 4  Outpatient Specialists     No chief complaint on file. Dyspnea     60 y.o.male,w psoriasis c/o cough since December 2017, Pt was seen in ER and tx with omnicef and zithromax. Pt then continued to have cough and seen in office, and tx with. Pt presented today due to worsening dyspnea and was found to have right sided pleural effusion on CXR. Pt sent to hospital for direct admission. Pt has not taken methotrexate for the past 2 weeks due to feeling poorly.   On Ar[facility] to [facility] had CT chest which shows a loculated pleural effusion. Pt has been afebrile, CT scan also showed possible right renal mass incompletely visualized on CT chest. Pt will be admitted for dyspnea from pleural effusion ? Empyema. Ultrasound guided thoracentesis has been ordered.  Discussed case with pulmonary who recommended IV abx. Pt seen by CT surgery who recommended VATS and decortication of empyema   Subjective:    John [name] today is sitting in chair, optimistic  Doing well.  Dyspnea improved.  Afebrile overnite.     Assessment  & Plan :    Active Problems:   Dyspnea   Pleural effusion   Psoriatic arthritis (HCC)   Empyema (HCC)   1. R pleural effusion/empyema s/p VATS/ decortication 2/1 Awaiting cultures Cont iv abx, vanco, clindamycin,  levaquin Appreciate CT surgery and pulmonary input  2. R renal lesion CT  scan abd/ pelvis when more stable  3. Psoriasis Methotrexate on hold  4. Hypertension Cont current bp medications.   5. Abnormal liver function improving  6. Anemia  stable  Code Status:FULL CODE  Family Communication :w patient and wife  Disposition Plan:home  Barriers For Discharge:  Consults :pulmonary, CT surgery  Procedures : R VATS/decortication 05/13/2016  DVT Prophylaxis: -SCDs,lovenox Antibiotics : vanco 1/30=>      levaquin 1/30=> Meropenem 1/30=> 2/2 Clindamycin 2/2=>   Lab Results  Component Value Date   PLT 388 05/15/2016    Anti-infectives    Start     Dose/Rate Route Frequency Ordered Stop   05/14/16 2200  clindamycin (CLEOCIN) IVPB 600 mg     600 mg 100 mL/hr over 30 Minutes Intravenous Every 8 hours 05/14/16 1941     05/14/16 2000  levofloxacin (LEVAQUIN) IVPB 750 mg     750 mg 100 mL/hr over 90 Minutes Intravenous Every 24 hours 05/14/16 1514     05/13/16 1900  levofloxacin (LEVAQUIN) IVPB 750 mg  Status:  Discontinued     750 mg 100 mL/hr over 90 Minutes Intravenous Every 48 hours 05/13/16 1527 05/14/16 1514   05/13/16 1630  vancomycin (VANCOCIN) IVPB 1000 mg/200 mL premix     1,000 mg 200 mL/hr over 60 Minutes Intravenous Every 12 hours 05/13/16 1527     05/12/16 0800  vancomycin (VANCOCIN) IVPB 750 mg/150 ml premix  Status:  Discontinued     750 mg 150 mL/hr over 60 Minutes Intravenous Every 12 hours 05/11/16 1838 05/13/16 1433   05/11/16 2000  meropenem (MERREM) 1 g in sodium chloride 0.9 % 100 mL IVPB  Status:  Discontinued     1 g 200 mL/hr over 30 Minutes Intravenous Every 8 hours 05/11/16 1837 05/13/16 1433   05/11/16 1900  vancomycin (VANCOCIN) 2,500 mg in sodium chloride 0.9 % 500 mL IVPB     2,500 mg 250 mL/hr over 120 Minutes Intravenous STAT 05/11/16 1836 05/11/16 2233   05/11/16 1900  levofloxacin (LEVAQUIN) IVPB 750 mg  Status:  Discontinued     750 mg 100 mL/hr over 90 Minutes Intravenous  Every 24 hours 05/11/16 1836 05/13/16 1433        Objective:   Vitals:   05/15/16 1110 05/15/16 1209 05/15/16 1600 05/15/16 1852  BP:  (!) 106/53    Pulse:  79    Resp: 19  17 17   Temp:  97.5 F (36.4 C)    TempSrc:      SpO2: 94% 97% 96% 95%  Weight:      Height:        Wt Readings from Last 3 Encounters:  05/14/16 110.6 kg (243 lb 13.3 oz)  06/19/14 117.9 kg (260 lb)  02/19/12 117 kg (258 lb)     Intake/Output Summary (Last 24 hours) at 05/15/16 1922 Last data filed at 05/15/16 1710  Gross per 24 hour  Intake             2040 ml  Output              555 ml  Net             1485 ml     Physical Exam  Awake Alert, Oriented X 3, No new F.N deficits, Normal affect [name],PERRAL Supple Neck,No JVD, No cervical lymphadenopathy appriciated.  Symmetrical Chest wall movement, Good air movement bilaterally, CTAB RRR,No Gallops,Rubs or new Murmurs, No Parasternal Heave +ve B.Sounds, Abd Soft, No tenderness, No organomegaly appriciated, No rebound - guarding or rigidity. No Cyanosis, Clubbing or edema, No new Rash or bruise  Chest tube in place    Data Review:  CBC  Recent Labs Lab 05/11/16 1622 05/12/16 0413 05/13/16 1025 05/14/16 0422 05/15/16 0435  WBC 18.9* 17.2* 22.1* 13.2* 9.8  HGB 9.9* 9.7* 8.9* 8.3* 8.1*  HCT 31.3* 30.4* 29.3* 27.2* 26.1*  PLT 536* 494* 539* 397 388  MCV 90.5 89.1 91.3 91.3 91.3  MCH 28.6 28.4 27.7 27.9 28.3  MCHC 31.6 31.9 30.4 30.5 31.0  RDW 14.4 14.5 14.6 14.7 14.7  LYMPHSABS 0.9  --   --   --   --   MONOABS 1.9*  --   --   --   --   EOSABS 0.0  --   --   --   --   BASOSABS 0.0  --   --   --   --     Chemistries   Recent Labs Lab 05/11/16 1622 05/12/16 0413 05/14/16 0422 05/15/16 0435  NA 136 135 136 138  K 4.2 4.3 4.1 4.2  CL 101 104 105 109  CO2 26 21* 23 25  GLUCOSE 139* 148* 152* 120*  BUN 24* 19 18 19   CREATININE 0.90 0.72 0.88 1.01  CALCIUM 8.2* 7.9* 7.7* 7.8*  AST 122* 114* 64* 55*  ALT 114* 124* 85*  74*  ALKPHOS 151* 150* 106 98  BILITOT 0.9 1.1 0.7 0.3   ------------------------------------------------------------------------------------------------------------------ No results for input(s): CHOL, HDL, LDLCALC, TRIG, CHOLHDL, LDLDIRECT in the last 72 hours.  No results found for: HGBA1C ------------------------------------------------------------------------------------------------------------------ No results for input(s): TSH, T4TOTAL, T3FREE, THYROIDAB in the last 72 hours.  Invalid input(s): FREET3 ------------------------------------------------------------------------------------------------------------------ No results for input(s): VITAMINB12, FOLATE, FERRITIN, TIBC, IRON, RETICCTPCT in the last 72 hours.  Coagulation profile  Recent Labs Lab 05/11/16 1622  INR 1.18    No results for input(s): DDIMER in the last 72 hours.  Cardiac Enzymes No results for input(s): CKMB, TROPONINI, MYOGLOBIN in the last 168 hours.  Invalid input(s): CK ------------------------------------------------------------------------------------------------------------------ No results found for: BNP  Inpatient Medications  Scheduled Meds: . acetaminophen  1,000 mg Oral Q6H   Or  . acetaminophen (TYLENOL) oral liquid 160 mg/5 mL  1,000 mg Oral Q6H  . bisacodyl  10 mg Oral Daily  . buPROPion  300 mg Oral Daily  . carvedilol  3.125 mg Oral BID WC  . clindamycin (CLEOCIN) IV  600 mg Intravenous Q8H  . enoxaparin (LOVENOX) injection  40 mg Subcutaneous Daily  . fentaNYL   Intravenous Q4H  . FLUoxetine  60 mg Oral Daily  . levofloxacin (LEVAQUIN) IV  750 mg Intravenous Q24H  . senna-docusate  1 tablet Oral QHS  . vancomycin  1,000 mg Intravenous Q12H   Continuous Infusions: . dextrose 5 % and 0.9 % NaCl with KCl 20 mEq/L 50 mL/hr at 05/15/16 1000   PRN Meds:.diphenhydrAMINE **OR** diphenhydrAMINE, ketorolac, levalbuterol, naloxone **AND** sodium chloride flush, ondansetron (ZOFRAN)  IV, ondansetron (ZOFRAN) IV, potassium chloride (KCL MULTIRUN) 30 mEq in 265 mL IVPB  Micro Results Recent Results (from the past 240 hour(s))  Culture, blood (routine x 2)     Status: None (Preliminary result)   Collection Time: 05/11/16  3:47 PM  Result Value Ref Range Status   Specimen Description BLOOD RIGHT ARM  Final   Special Requests BOTTLES DRAWN AEROBIC AND ANAEROBIC 10CC  Final   Culture   Final    NO GROWTH 4 DAYS Performed at [facility], [address], [place], [place] [address]    Report Status PENDING  Incomplete  Culture, blood (routine x 2)     Status:  None (Preliminary result)   Collection Time: 05/11/16  3:47 PM  Result Value Ref Range Status   Specimen Description BLOOD LEFT ARM  Final   Special Requests BOTTLES DRAWN AEROBIC ONLY 5CC  Final   Culture   Final    NO GROWTH 4 DAYS Performed at [facility], [address] [place], [place] [address]    Report Status PENDING  Incomplete  Fungus Culture With Stain     Status: None (Preliminary result)   Collection Time: 05/13/16  8:42 AM  Result Value Ref Range Status   Fungus Stain Final report  Final    Comment: (NOTE) Performed At: [facility] [address] [place], [place] [id number] Mila Homer MD [id number]    Fungus (Mycology) Culture PENDING  Incomplete   Fungal Source PLEURAL  Final    Comment: RIGHT  Acid Fast Smear (AFB)     Status: None   Collection Time: 05/13/16  8:42 AM  Result Value Ref Range Status   AFB Specimen Processing Concentration  Final   Acid Fast Smear Negative  Final    Comment: (NOTE) Performed At: [facility] [address] [place], [place] [id number] Mila Homer MD [id number]    Source (AFB) PLEURAL  Final    Comment: RIGHT  Culture, body fluid-bottle     Status: None (Preliminary result)   Collection Time: 05/13/16  8:42 AM  Result Value Ref Range Status   Specimen Description PLEURAL RIGHT  Final   Special Requests  NONE  Final   Gram Stain   Final    GRAM POSITIVE COCCI IN PAIRS GRAM NEGATIVE RODS IN BOTH AEROBIC AND ANAEROBIC BOTTLES    Culture CULTURE REINCUBATED FOR BETTER GROWTH  Final   Report Status PENDING  Incomplete  Gram stain     Status: None   Collection Time: 05/13/16  8:42 AM  Result Value Ref Range Status   Specimen Description PLEURAL RIGHT  Final   Special Requests NONE  Final   Gram Stain   Final    FEW WBC PRESENT, PREDOMINANTLY PMN ABUNDANT GRAM NEGATIVE RODS ABUNDANT GRAM POSITIVE COCCI IN PAIRS ABUNDANT GRAM VARIABLE ROD    Report Status 05/13/2016 FINAL  Final  Fungus Culture Result     Status: None   Collection Time: 05/13/16  8:42 AM  Result Value Ref Range Status   Result 1 Comment  Final    Comment: (NOTE) KOH/Calcofluor preparation:  no fungus observed. Performed At: [facility] [address] [place], [place] [id number] Mila Homer MD [id number]   MRSA PCR Screening     Status: None   Collection Time: 05/13/16  2:54 PM  Result Value Ref Range Status   MRSA by PCR NEGATIVE NEGATIVE Final    Comment:        The GeneXpert MRSA Assay (FDA approved for NASAL specimens only), is one component of a comprehensive MRSA colonization surveillance program. It is not intended to diagnose MRSA infection nor to guide or monitor treatment for MRSA infections.     Radiology Reports Ct Chest W Contrast  Result Date: 05/11/2016 CLINICAL DATA:  Extensive right pleural effusion EXAM: CT CHEST WITH CONTRAST TECHNIQUE: Multidetector CT imaging of the chest was performed during intravenous contrast administration. CONTRAST:  75mL ISOVUE-300 IOPAMIDOL (ISOVUE-300) INJECTION 61% COMPARISON:  Chest x-ray from earlier in the same day, 04/26/2016 FINDINGS: Cardiovascular: Thoracic aorta demonstrates some calcific changes without aneurysmal dilatation or dissection. Pulmonary artery shows no large central filling  defects although timing for emboli it was not  performed. Cardiac structures are within normal limits. Mediastinum/Nodes: The thoracic inlet shows no acute abnormality. Scattered small lymph nodes are noted throughout the mediastinum likely of reactive nature. No sizable lymph nodes are seen. Lungs/Pleura: The left lung is within normal limits. The right hemithorax demonstrates evidence of a large loculated pleural effusion within air-fluid level most consistent with empyema. Some associated right lower lobe and right middle lobe atelectatic changes are seen. No parenchymal nodules are noted. Upper Abdomen: Visualized upper abdomen shows a masslike density measuring 4.8 cm in the upper pole of the left kidney this is incompletely evaluated on this exam. No other focal abnormality in the upper abdomen is seen. Musculoskeletal: Bony structures show degenerative change of the thoracic spine. No acute bony abnormality is noted. IMPRESSION: Loculated pleural fluid collection with air-fluid level consistent with an empyema. Tube drainage is recommended. Suggestion of a right upper pole renal mass as described. This is incompletely evaluated on this exam. CT of the abdomen and pelvis with contrast material is recommended for further evaluation. These results will be called to the ordering clinician or representative by the Radiologist Assistant, and communication documented in the PACS or zVision Dashboard. Electronically Signed   By: Alcide CleverMark  Lukens M.D.   On: 05/11/2016 17:33   Dg Chest Port 1 View  Result Date: 05/15/2016 CLINICAL DATA:  61 year old male status post VATS for right empyema postoperative day 2. Initial encounter. EXAM: PORTABLE CHEST 1 VIEW COMPARISON:  05/14/2016 and earlier. FINDINGS: Seated AP portable view at 0521 hours. Stable right IJ central line. Two right chest tubes remain in place. No pneumothorax identified. Stable lung volumes. Decreasing Patchy and confluent opacity in the right lung. Stable left lung base streaky opacity which most  resembles atelectasis. No areas of worsening ventilation. No superimposed pulmonary edema. Stable cardiac size and mediastinal contours. IMPRESSION: 1. Stable lines and tubes. Two right chest tubes in place, no pneumothorax identified. 2. Improving right lung ventilation. Stable mild left lung base atelectasis. Electronically Signed   By: Odessa FlemingH  Hall M.D.   On: 05/15/2016 07:12   Dg Chest Port 1 View  Result Date: 05/14/2016 CLINICAL DATA:  Empyema, chest 2 EXAM: PORTABLE CHEST 1 VIEW COMPARISON:  Chest radiograph from one day prior. FINDINGS: Superior most right chest tube terminates over the mid to upper right pleural space. Basilar right chest tube is in place. Right internal jugular central venous catheter terminates in the upper third of the superior vena cava. Stable cardiomediastinal silhouette with normal heart size. No pneumothorax. Stable mild pleural thickening in the peripheral right pleural space. No left pleural effusion. No overt pulmonary edema. Stable low lung volumes. Stable patchy opacity in the mid to lower right lung. Mild left basilar atelectasis. Subcutaneous emphysema in the lateral lower right chest wall. IMPRESSION: 1. No pneumothorax. Right chest tubes in place. Stable peripheral right pleural thickening. 2. Stable low lung volumes. Stable patchy mid to lower right lung opacity and mild left basilar atelectasis. Electronically Signed   By: Delbert PhenixJason A Poff M.D.   On: 05/14/2016 07:36   Dg Chest Port 1 View  Result Date: 05/13/2016 CLINICAL DATA:  LEFT lung surgery.  Empyema EXAM: PORTABLE CHEST 1 VIEW COMPARISON:  Radiograph 05/11/2016 FINDINGS: Normal cardiac silhouette. Interval placement of 2 RIGHT chest tubes, 1 directed superiorly and 1 directed inferiorly. There is marked reduction in volume of the fluid collection at the RIGHT lung base with only small residual fluid and atelectasis evident  by a radiograph. No visible pneumothorax. LEFT lung clear. RIGHT central venous line.  IMPRESSION: Marked reduction in volume of fluid within the cavitary lesion in the RIGHT lower lobe following two chest tube placement. Electronically Signed   By: Genevive Bi M.D.   On: 05/13/2016 10:46    Time Spent in minutes  30   Pearson Grippe M.D on 05/15/2016 at 7:22 PM  Between 7am to 7am - Pager - [phone]

## 2016-05-15 NOTE — Progress Notes (Signed)
2 Days Post-Op Procedure(s) (LRB): VIDEO ASSISTED THORACOSCOPY (VATS)/DRAIN EMPYEMA (Right) Subjective: Progressing well after VATS empyema Chest tube drainage decreasing prob anerobic strep in pleural fluid-,clindamycin started Objective: Vital signs in last 24 hours: Temp:  [97.4 F (36.3 C)-97.9 F (36.6 C)] 97.9 F (36.6 C) (02/03 0800) Pulse Rate:  [25-94] 88 (02/03 0900) Cardiac Rhythm: Normal sinus rhythm (02/03 0900) Resp:  [18-23] 19 (02/03 0900) BP: (86-137)/(47-89) 137/89 (02/03 0800) SpO2:  [92 %-99 %] 98 % (02/03 0900)  Hemodynamic parameters for last 24 hours:  stable  Intake/Output from previous day: 02/02 0701 - 02/03 0700 In: 2875.8 [P.O.:960; I.V.:1265.8; IV Piggyback:650] Out: 785 [Urine:615; Chest Tube:170] Intake/Output this shift: Total I/O In: 340 [P.O.:240; I.V.:100] Out: -  Min air leak with cough  Lab Results:  Recent Labs  05/14/16 0422 05/15/16 0435  WBC 13.2* 9.8  HGB 8.3* 8.1*  HCT 27.2* 26.1*  PLT 397 388   BMET:  Recent Labs  05/14/16 0422 05/15/16 0435  NA 136 138  K 4.1 4.2  CL 105 109  CO2 23 25  GLUCOSE 152* 120*  BUN 18 19  CREATININE 0.88 1.01  CALCIUM 7.7* 7.8*    PT/INR: No results for input(s): LABPROT, INR in the last 72 hours. ABG    Component Value Date/Time   PHART 7.399 05/14/2016 0413   HCO3 25.2 05/14/2016 0413   TCO2 26 05/14/2016 0413   O2SAT 97.0 05/14/2016 0413   CBG (last 3)   Recent Labs  05/14/16 0347 05/14/16 0749 05/15/16 0835  GLUCAP 143* 134* 138*    Assessment/Plan: S/P Procedure(s) (LRB): VIDEO ASSISTED THORACOSCOPY (VATS)/DRAIN EMPYEMA (Right) Mobilize transfer to 2 west   LOS: 4 days    Kathlee Nationseter Van Trigt III 05/15/2016

## 2016-05-15 NOTE — Progress Notes (Signed)
4.5 ml of Fentanyl PCA syringe wasted with Adrianne Jacobs,RN, new syringe placed.

## 2016-05-15 NOTE — Progress Notes (Signed)
Transferred pt to rm 2w32 at this time.  Pt has no c/o pain or s/s of any acute distress.  Report [facility] to [facility] Center[facility]herise, Charity fundraiserN.

## 2016-05-16 ENCOUNTER — Inpatient Hospital Stay (HOSPITAL_COMMUNITY): Payer: 59

## 2016-05-16 LAB — BASIC METABOLIC PANEL
Anion gap: 10 (ref 5–15)
BUN: 16 mg/dL (ref 6–20)
CO2: 25 mmol/L (ref 22–32)
Calcium: 8 mg/dL — ABNORMAL LOW (ref 8.9–10.3)
Chloride: 105 mmol/L (ref 101–111)
Creatinine, Ser: 1.06 mg/dL (ref 0.61–1.24)
GFR calc Af Amer: >60 mL/min (ref >60)
GFR calc non Af Amer: >60 mL/min (ref >60)
Glucose, Bld: 108 mg/dL — ABNORMAL HIGH (ref 65–99)
Potassium: 4.2 mmol/L (ref 3.5–5.1)
Sodium: 140 mmol/L (ref 135–145)

## 2016-05-16 LAB — CBC
HCT: 28.1 % — ABNORMAL LOW (ref 39.0–52.0)
Hemoglobin: 8.7 g/dL — ABNORMAL LOW (ref 13.0–17.0)
MCH: 28 pg (ref 26.0–34.0)
MCHC: 31 g/dL (ref 30.0–36.0)
MCV: 90.4 fL (ref 78.0–100.0)
Platelets: 489 10*3/uL — ABNORMAL HIGH (ref 150–400)
RBC: 3.11 MIL/uL — ABNORMAL LOW (ref 4.22–5.81)
RDW: 15 % (ref 11.5–15.5)
WBC: 10.4 10*3/uL (ref 4.0–10.5)

## 2016-05-16 LAB — VANCOMYCIN, TROUGH: VANCOMYCIN TR: 16 ug/mL (ref 15–20)

## 2016-05-16 LAB — CULTURE, BLOOD (ROUTINE X 2)
CULTURE: NO GROWTH
Culture: NO GROWTH

## 2016-05-16 MED ORDER — SODIUM CHLORIDE 0.9% FLUSH
10.0000 mL | INTRAVENOUS | Status: DC | PRN
  Administered 2016-05-17 – 2016-05-19 (×3): 10 mL
  Filled 2016-05-16 (×3): qty 40

## 2016-05-16 NOTE — Progress Notes (Addendum)
      [address]       [place] [id number]             [phone]      3 Days Post-Op Procedure(s) (LRB): VIDEO ASSISTED THORACOSCOPY (VATS)/DRAIN EMPYEMA (Right) Subjective: Sleepy this morning with no other issues.   Objective: Vital signs in last 24 hours: Temp:  [97.5 F (36.4 C)-98.5 F (36.9 C)] 98.1 F (36.7 C) (02/04 0810) Pulse Rate:  [78-87] 78 (02/04 0810) Cardiac Rhythm: Normal sinus rhythm (02/04 0700) Resp:  [16-25] 17 (02/04 0810) BP: (106-135)/(52-66) 135/66 (02/04 0810) SpO2:  [93 %-97 %] 96 % (02/04 0810)     Intake/Output from previous day: 02/03 0701 - 02/04 0700 In: 1954.3 [P.O.:530; I.V.:1224.3; IV Piggyback:200] Out: 1085 [Urine:975; Chest Tube:110] Intake/Output this shift: No intake/output data recorded.  General appearance: alert, cooperative and no distress Heart: regular rate and rhythm, S1, S2 normal, no murmur, click, rub or gallop Lungs: clear to auscultation bilaterally Abdomen: soft, non-tender; bowel sounds normal; no masses,  no organomegaly Extremities: extremities normal, atraumatic, no cyanosis or edema Wound: clean and dry  Lab Results:  Recent Labs  05/15/16 0435 05/16/16 0521  WBC 9.8 10.4  HGB 8.1* 8.7*  HCT 26.1* 28.1*  PLT 388 489*   BMET:  Recent Labs  05/15/16 0435 05/16/16 0521  NA 138 140  K 4.2 4.2  CL 109 105  CO2 25 25  GLUCOSE 120* 108*  BUN 19 16  CREATININE 1.01 1.06  CALCIUM 7.8* 8.0*    PT/INR: No results for input(s): LABPROT, INR in the last 72 hours. ABG    Component Value Date/Time   PHART 7.399 05/14/2016 0413   HCO3 25.2 05/14/2016 0413   TCO2 26 05/14/2016 0413   O2SAT 97.0 05/14/2016 0413   CBG (last 3)   Recent Labs  05/14/16 0347 05/14/16 0749 05/15/16 0835  GLUCAP 143* 134* 138*    Assessment/Plan: S/P Procedure(s) (LRB): VIDEO ASSISTED THORACOSCOPY (VATS)/DRAIN EMPYEMA (Right)  1. CV-NSR in the 70s. Stable BP 2. Pulm-tolerating room air with  good oxygen saturation. CXR stable without pneumothorax. Decreasing streaky right lung opacity. Output 110cc in 24 hours. Will continue.  3. Renal- creatinine stable at 1.06. Good urine output. Electrolytes okay 4. Endo-blood glucose level well controlled 5. H and H stable 6. Continue clindamycin, Levaquin, and Vancomycin. Afebrile with WBC trending down 7. Continue PCA for pain control  Plan: Continue current medical plan. Keep chest tubes for now. Mobilize. Encourage Incentive spirometry.    LOS: 5 days    Sharlene Doryessa N Conte 05/16/2016  Empyema cultures yielding gram positive cocci in pairs and gram-negative rods,, both aerobic and anaerobic cultures Continue vancomycin, Levaquin, clindamycin as patient is allergic to penicillin and meropenem Chest tube output significantly reduced-remove one tube today, then assess  tomorrow Afebrile with  white count now normal patient examined and medical record reviewed,agree with above note. Kathlee Nationseter Van Trigt III 05/16/2016  CT of abdomen ordered by patient's primary physician to evaluate a renal lesion

## 2016-05-16 NOTE — Progress Notes (Signed)
Patient ID: John [name], male   DOB: [birth year], 61 y.o.   MRN: [id number]                                                                PROGRESS NOTE                                                                                                                                                                                                             Patient Demographics:    John [name], is a 61 y.o. male, DOB - 18-Feb-1956, [id number]:[id number]  Admit date - 05/11/2016   Admitting Physician Pearson Grippe, MD  Outpatient Primary MD for the patient is Pearson Grippe, MD  LOS - 5  Outpatient Specialists:  Alben Deeds (rheumatology)  No chief complaint on file.    Dyspnea  Brief Narrative  60 y.o.male,w psoriasis c/o cough since December 2017, Pt was seen in ER and tx with omnicef and zithromax. Pt then continued to have cough and seen in office, and tx with. Pt presented today due to worsening dyspnea and was found to have right sided pleural effusion on CXR. Pt sent to hospital for direct admission. Pt has not taken methotrexate for the past 2 weeks due to feeling poorly.   On Arrival to [facility] had CT chest which shows a loculated pleural effusion. Pt has been afebrile, CT scan also showed possible right renal mass incompletely visualized on CT chest. Pt will be admitted for dyspnea from pleural effusion ? Empyema. Ultrasound guided thoracentesis has been ordered.  Discussed case with pulmonary who recommended IV abx. Pt seen by CT surgery who recommended VATS and decortication of empyema   Subjective:    John [name] today has stated felt some numbness around chest tube. Pt is otherwise doing well.  Denies fever, chills, cough, cp, palp, sob.     Assessment  & Plan :    Active Problems:   Dyspnea   Pleural effusion   Psoriatic arthritis (HCC)   Empyema (HCC)   1. R pleural effusion/empyema s/p VATS/ decortication 2/1 Awaiting cultures, (gram stain gram+ cocci , gram  negative rods) Cont iv abx, vanco, clindamycin,  levaquin Appreciate CT surgery and pulmonary input Will d/c ivf  2. R renal lesion CT scan abd/ pelvis when more stable, possibly  Monday   3. Psoriasis Methotrexate on hold  4. Hypertension Cont current bp medications.   5. Abnormal liver function improving  6. Anemia Stable, awaiting repeat cbc this am  Code Status:FULL CODE  Family Communication :w patient and wife  Disposition Plan:home  Barriers For Discharge:  Consults :pulmonary, CT surgery  Procedures : R VATS/decortication 05/13/2016  DVT Prophylaxis: -SCDs,lovenox Antibiotics : vanco 1/30=>      levaquin 1/30=> Meropenem 1/30=> 2/2 Clindamycin 2/2=>    Lab Results  Component Value Date   PLT 489 (H) 05/16/2016      Anti-infectives    Start     Dose/Rate Route Frequency Ordered Stop   05/15/16 2200  clindamycin (CLEOCIN) IVPB 600 mg     600 mg 100 mL/hr over 30 Minutes Intravenous Every 8 hours 05/15/16 2009     05/14/16 2200  clindamycin (CLEOCIN) IVPB 600 mg  Status:  Discontinued     600 mg 100 mL/hr over 30 Minutes Intravenous Every 8 hours 05/14/16 1941 05/15/16 1945   05/14/16 2000  levofloxacin (LEVAQUIN) IVPB 750 mg     750 mg 100 mL/hr over 90 Minutes Intravenous Every 24 hours 05/14/16 1514     05/13/16 1900  levofloxacin (LEVAQUIN) IVPB 750 mg  Status:  Discontinued     750 mg 100 mL/hr over 90 Minutes Intravenous Every 48 hours 05/13/16 1527 05/14/16 1514   05/13/16 1630  vancomycin (VANCOCIN) IVPB 1000 mg/200 mL premix     1,000 mg 200 mL/hr over 60 Minutes Intravenous Every 12 hours 05/13/16 1527     05/12/16 0800  vancomycin (VANCOCIN) IVPB 750 mg/150 ml premix  Status:  Discontinued     750 mg 150 mL/hr over 60 Minutes Intravenous Every 12 hours 05/11/16 1838 05/13/16 1433   05/11/16 2000  meropenem (MERREM) 1 g in sodium chloride 0.9 % 100 mL IVPB  Status:  Discontinued     1 g 200 mL/hr over  30 Minutes Intravenous Every 8 hours 05/11/16 1837 05/13/16 1433   05/11/16 1900  vancomycin (VANCOCIN) 2,500 mg in sodium chloride 0.9 % 500 mL IVPB     2,500 mg 250 mL/hr over 120 Minutes Intravenous STAT 05/11/16 1836 05/11/16 2233   05/11/16 1900  levofloxacin (LEVAQUIN) IVPB 750 mg  Status:  Discontinued     750 mg 100 mL/hr over 90 Minutes Intravenous Every 24 hours 05/11/16 1836 05/13/16 1433        Objective:   Vitals:   05/15/16 1600 05/15/16 1852 05/15/16 2039 05/16/16 0542  BP:   (!) 116/52 124/66  Pulse:   80 84  Resp: 17 17 18 18   Temp:   97.9 F (36.6 C) 98.5 F (36.9 C)  TempSrc:   Oral Oral  SpO2: 96% 95% 93% 93%  Weight:      Height:        Wt Readings from Last 3 Encounters:  05/14/16 110.6 kg (243 lb 13.3 oz)  06/19/14 117.9 kg (260 lb)  02/19/12 117 kg (258 lb)     Intake/Output Summary (Last 24 hours) at 05/16/16 0627 Last data filed at 05/16/16 0545  Gross per 24 hour  Intake          2004.33 ml  Output             1045 ml  Net           959.33 ml     Physical Exam  Awake Alert, Oriented X 3, No new F.N deficits, Normal  affect [place].AT,PERRAL Supple Neck,No JVD, No cervical lymphadenopathy appriciated.  Symmetrical Chest wall movement, Good air movement bilaterally, faint crackle right lower lung RRR,No Gallops,Rubs or new Murmurs, No Parasternal Heave +ve B.Sounds, Abd Soft, No tenderness, No organomegaly appriciated, No rebound - guarding or rigidity. No Cyanosis, Clubbing or edema, No new Rash or bruise  Chest tube in place , slight serosanguinous drainage    Data Review:    CBC  Recent Labs Lab 05/11/16 1622 05/12/16 0413 05/13/16 1025 05/14/16 0422 05/15/16 0435 05/16/16 0521  WBC 18.9* 17.2* 22.1* 13.2* 9.8 10.4  HGB 9.9* 9.7* 8.9* 8.3* 8.1* 8.7*  HCT 31.3* 30.4* 29.3* 27.2* 26.1* 28.1*  PLT 536* 494* 539* 397 388 489*  MCV 90.5 89.1 91.3 91.3 91.3 90.4  MCH 28.6 28.4 27.7 27.9 28.3 28.0  MCHC 31.6 31.9 30.4 30.5 31.0  31.0  RDW 14.4 14.5 14.6 14.7 14.7 15.0  LYMPHSABS 0.9  --   --   --   --   --   MONOABS 1.9*  --   --   --   --   --   EOSABS 0.0  --   --   --   --   --   BASOSABS 0.0  --   --   --   --   --     Chemistries   Recent Labs Lab 05/11/16 1622 05/12/16 0413 05/14/16 0422 05/15/16 0435 05/16/16 0521  NA 136 135 136 138 140  K 4.2 4.3 4.1 4.2 4.2  CL 101 104 105 109 105  CO2 26 21* 23 25 25   GLUCOSE 139* 148* 152* 120* 108*  BUN 24* 19 18 19 16   CREATININE 0.90 0.72 0.88 1.01 1.06  CALCIUM 8.2* 7.9* 7.7* 7.8* 8.0*  AST 122* 114* 64* 55*  --   ALT 114* 124* 85* 74*  --   ALKPHOS 151* 150* 106 98  --   BILITOT 0.9 1.1 0.7 0.3  --    ------------------------------------------------------------------------------------------------------------------ No results for input(s): CHOL, HDL, LDLCALC, TRIG, CHOLHDL, LDLDIRECT in the last 72 hours.  No results found for: HGBA1C ------------------------------------------------------------------------------------------------------------------ No results for input(s): TSH, T4TOTAL, T3FREE, THYROIDAB in the last 72 hours.  Invalid input(s): FREET3 ------------------------------------------------------------------------------------------------------------------ No results for input(s): VITAMINB12, FOLATE, FERRITIN, TIBC, IRON, RETICCTPCT in the last 72 hours.  Coagulation profile  Recent Labs Lab 05/11/16 1622  INR 1.18    No results for input(s): DDIMER in the last 72 hours.  Cardiac Enzymes No results for input(s): CKMB, TROPONINI, MYOGLOBIN in the last 168 hours.  Invalid input(s): CK ------------------------------------------------------------------------------------------------------------------ No results found for: BNP  Inpatient Medications  Scheduled Meds: . acetaminophen  1,000 mg Oral Q6H   Or  . acetaminophen (TYLENOL) oral liquid 160 mg/5 mL  1,000 mg Oral Q6H  . bisacodyl  10 mg Oral Daily  . buPROPion  300 mg  Oral Daily  . carvedilol  3.125 mg Oral BID WC  . clindamycin (CLEOCIN) IV  600 mg Intravenous Q8H  . enoxaparin (LOVENOX) injection  40 mg Subcutaneous Daily  . fentaNYL   Intravenous Q4H  . FLUoxetine  60 mg Oral Daily  . levofloxacin (LEVAQUIN) IV  750 mg Intravenous Q24H  . saccharomyces boulardii  250 mg Oral BID  . senna-docusate  1 tablet Oral QHS  . vancomycin  1,000 mg Intravenous Q12H   Continuous Infusions: . dextrose 5 % and 0.9 % NaCl with KCl 20 mEq/L 50 mL/hr at 05/15/16 2241   PRN Meds:.diphenhydrAMINE **OR** diphenhydrAMINE, ketorolac,  levalbuterol, naloxone **AND** sodium chloride flush, ondansetron (ZOFRAN) IV, ondansetron (ZOFRAN) IV, potassium chloride (KCL MULTIRUN) 30 mEq in 265 mL IVPB, sodium chloride flush  Micro Results Recent Results (from the past 240 hour(s))  Culture, blood (routine x 2)     Status: None (Preliminary result)   Collection Time: 05/11/16  3:47 PM  Result Value Ref Range Status   Specimen Description BLOOD RIGHT ARM  Final   Special Requests BOTTLES DRAWN AEROBIC AND ANAEROBIC 10CC  Final   Culture   Final    NO GROWTH 4 DAYS Performed at [facility], [address], [place], [place] [address]    Report Status PENDING  Incomplete  Culture, blood (routine x 2)     Status: None (Preliminary result)   Collection Time: 05/11/16  3:47 PM  Result Value Ref Range Status   Specimen Description BLOOD LEFT ARM  Final   Special Requests BOTTLES DRAWN AEROBIC ONLY 5CC  Final   Culture   Final    NO GROWTH 4 DAYS Performed at [facility], [address], [place], [place] [address]    Report Status PENDING  Incomplete  Fungus Culture With Stain     Status: None (Preliminary result)   Collection Time: 05/13/16  8:42 AM  Result Value Ref Range Status   Fungus Stain Final report  Final    Comment: (NOTE) Performed At: [facility] [address] [place], [place] [id number] Mila Homer MD [id number]    Fungus  (Mycology) Culture PENDING  Incomplete   Fungal Source PLEURAL  Final    Comment: RIGHT  Acid Fast Smear (AFB)     Status: None   Collection Time: 05/13/16  8:42 AM  Result Value Ref Range Status   AFB Specimen Processing Concentration  Final   Acid Fast Smear Negative  Final    Comment: (NOTE) Performed At: [facility] [address] [place], [place] [id number] Mila Homer MD [id number]    Source (AFB) PLEURAL  Final    Comment: RIGHT  Culture, body fluid-bottle     Status: None (Preliminary result)   Collection Time: 05/13/16  8:42 AM  Result Value Ref Range Status   Specimen Description PLEURAL RIGHT  Final   Special Requests NONE  Final   Gram Stain   Final    GRAM POSITIVE COCCI IN PAIRS GRAM NEGATIVE RODS IN BOTH AEROBIC AND ANAEROBIC BOTTLES    Culture CULTURE REINCUBATED FOR BETTER GROWTH  Final   Report Status PENDING  Incomplete  Gram stain     Status: None   Collection Time: 05/13/16  8:42 AM  Result Value Ref Range Status   Specimen Description PLEURAL RIGHT  Final   Special Requests NONE  Final   Gram Stain   Final    FEW WBC PRESENT, PREDOMINANTLY PMN ABUNDANT GRAM NEGATIVE RODS ABUNDANT GRAM POSITIVE COCCI IN PAIRS ABUNDANT GRAM VARIABLE ROD    Report Status 05/13/2016 FINAL  Final  Fungus Culture Result     Status: None   Collection Time: 05/13/16  8:42 AM  Result Value Ref Range Status   Result 1 Comment  Final    Comment: (NOTE) KOH/Calcofluor preparation:  no fungus observed. Performed At: [facility] [address] [place], [place] [id number] Mila Homer MD [id number]   MRSA PCR Screening     Status: None   Collection Time: 05/13/16  2:54 PM  Result Value Ref Range Status   MRSA by PCR  NEGATIVE NEGATIVE Final    Comment:        The GeneXpert MRSA Assay (FDA approved for NASAL specimens only), is one component of a comprehensive MRSA colonization surveillance program. It is not intended to diagnose  MRSA infection nor to guide or monitor treatment for MRSA infections.     Radiology Reports Ct Chest W Contrast  Result Date: 05/11/2016 CLINICAL DATA:  Extensive right pleural effusion EXAM: CT CHEST WITH CONTRAST TECHNIQUE: Multidetector CT imaging of the chest was performed during intravenous contrast administration. CONTRAST:  75mL ISOVUE-300 IOPAMIDOL (ISOVUE-300) INJECTION 61% COMPARISON:  Chest x-ray from earlier in the same day, 04/26/2016 FINDINGS: Cardiovascular: Thoracic aorta demonstrates some calcific changes without aneurysmal dilatation or dissection. Pulmonary artery shows no large central filling defects although timing for emboli it was not performed. Cardiac structures are within normal limits. Mediastinum/Nodes: The thoracic inlet shows no acute abnormality. Scattered small lymph nodes are noted throughout the mediastinum likely of reactive nature. No sizable lymph nodes are seen. Lungs/Pleura: The left lung is within normal limits. The right hemithorax demonstrates evidence of a large loculated pleural effusion within air-fluid level most consistent with empyema. Some associated right lower lobe and right middle lobe atelectatic changes are seen. No parenchymal nodules are noted. Upper Abdomen: Visualized upper abdomen shows a masslike density measuring 4.8 cm in the upper pole of the left kidney this is incompletely evaluated on this exam. No other focal abnormality in the upper abdomen is seen. Musculoskeletal: Bony structures show degenerative change of the thoracic spine. No acute bony abnormality is noted. IMPRESSION: Loculated pleural fluid collection with air-fluid level consistent with an empyema. Tube drainage is recommended. Suggestion of a right upper pole renal mass as described. This is incompletely evaluated on this exam. CT of the abdomen and pelvis with contrast material is recommended for further evaluation. These results will be called to the ordering clinician or  representative by the Radiologist Assistant, and communication documented in the PACS or zVision Dashboard. Electronically Signed   By: Alcide Clever M.D.   On: 05/11/2016 17:33   Dg Chest Port 1 View  Result Date: 05/15/2016 CLINICAL DATA:  61 year old male status post VATS for right empyema postoperative day 2. Initial encounter. EXAM: PORTABLE CHEST 1 VIEW COMPARISON:  05/14/2016 and earlier. FINDINGS: Seated AP portable view at 0521 hours. Stable right IJ central line. Two right chest tubes remain in place. No pneumothorax identified. Stable lung volumes. Decreasing Patchy and confluent opacity in the right lung. Stable left lung base streaky opacity which most resembles atelectasis. No areas of worsening ventilation. No superimposed pulmonary edema. Stable cardiac size and mediastinal contours. IMPRESSION: 1. Stable lines and tubes. Two right chest tubes in place, no pneumothorax identified. 2. Improving right lung ventilation. Stable mild left lung base atelectasis. Electronically Signed   By: Odessa Fleming M.D.   On: 05/15/2016 07:12   Dg Chest Port 1 View  Result Date: 05/14/2016 CLINICAL DATA:  Empyema, chest 2 EXAM: PORTABLE CHEST 1 VIEW COMPARISON:  Chest radiograph from one day prior. FINDINGS: Superior most right chest tube terminates over the mid to upper right pleural space. Basilar right chest tube is in place. Right internal jugular central venous catheter terminates in the upper third of the superior vena cava. Stable cardiomediastinal silhouette with normal heart size. No pneumothorax. Stable mild pleural thickening in the peripheral right pleural space. No left pleural effusion. No overt pulmonary edema. Stable low lung volumes. Stable patchy opacity in the mid to lower right  lung. Mild left basilar atelectasis. Subcutaneous emphysema in the lateral lower right chest wall. IMPRESSION: 1. No pneumothorax. Right chest tubes in place. Stable peripheral right pleural thickening. 2. Stable low lung  volumes. Stable patchy mid to lower right lung opacity and mild left basilar atelectasis. Electronically Signed   By: Delbert PhenixJason A Poff M.D.   On: 05/14/2016 07:36   Dg Chest Port 1 View  Result Date: 05/13/2016 CLINICAL DATA:  LEFT lung surgery.  Empyema EXAM: PORTABLE CHEST 1 VIEW COMPARISON:  Radiograph 05/11/2016 FINDINGS: Normal cardiac silhouette. Interval placement of 2 RIGHT chest tubes, 1 directed superiorly and 1 directed inferiorly. There is marked reduction in volume of the fluid collection at the RIGHT lung base with only small residual fluid and atelectasis evident by a radiograph. No visible pneumothorax. LEFT lung clear. RIGHT central venous line. IMPRESSION: Marked reduction in volume of fluid within the cavitary lesion in the RIGHT lower lobe following two chest tube placement. Electronically Signed   By: Genevive BiStewart  Edmunds M.D.   On: 05/13/2016 10:46    Time Spent in minutes  30   Pearson GrippeJames Jessenia Filippone M.D on 05/16/2016 at 6:27 AM  Between 7am to 7am - Pager - [phone]

## 2016-05-16 NOTE — Progress Notes (Signed)
Pharmacy Antibiotic Note  John CrumbleRichard T Stolz is a 61 y.o. male admitted on 05/11/2016 with pleural effusion with empyema and was on vancomycin, levaquin and meropenem. Patient is now s/o VATs with drainage of empyema/decortication. Pharmacy to continue dosing levaquin and vancomycin (also noted on clindamycin) -CrCl ~ 90-100, vancomycin trough= 16     Plan: -Continue vancomycin 1000mg  IV q12h -Levaquin 750mg  IV q24h -Will follow renal function, cultures and clinical progress    Height: 5\' 11"  (180.3 cm) Weight: 243 lb 13.3 oz (110.6 kg) IBW/kg (Calculated) : 75.3  Temp (24hrs), Avg:98 F (36.7 C), Min:97.5 F (36.4 C), Max:98.5 F (36.9 C)   Recent Labs Lab 05/11/16 1622 05/12/16 0413 05/13/16 1025 05/14/16 0422 05/15/16 0435 05/16/16 0521  WBC 18.9* 17.2* 22.1* 13.2* 9.8 10.4  CREATININE 0.90 0.72  --  0.88 1.01 1.06  VANCOTROUGH  --   --   --   --   --  16    Estimated Creatinine Clearance: 91.4 mL/min (by C-G formula based on SCr of 1.06 mg/dL).   05/16/16 : CrCl (n) = 86 ml/min   Allergies  Allergen Reactions  . Meropenem Rash  . Penicillins Rash    ++ pt's wife reported he tolerated keflex in the past++ Has patient had a PCN reaction causing immediate rash, facial/tongue/throat swelling, SOB or lightheadedness with hypotension: No Has patient had a PCN reaction causing severe rash involving mucus membranes or skin necrosis: No Has patient had a PCN reaction that required hospitalization No Has patient had a PCN reaction occurring within the last 10 years: No If all of the above answers are "NO", then may proceed with Cephalosporin use.    Antimicrobials this admission: 2/2 clinda>> 1/30 Vanc >> 1/30 Meropenem >>2/1 1/30 Levofloxacin >>   Dose adjustments this admission: 1/31 750mg  IV q12h; changed to 1000mg  IV q12h on 2/1 2/2 changed lvq 750 q48 to 750 q24 2/4 VT= 16  Microbiology results: 1/30 BCx: ngtd 2/1pleural fluid gram positive cocci in  pairs. GNR 2/1 MRSA neg  Thank you for allowing pharmacy to be a part of this patient's care.  Harland GermanAndrew Hagan [place], Pharm D 05/16/2016 10:13 AM

## 2016-05-16 NOTE — Progress Notes (Signed)
Pt c/o numbness or "feeling something under his skin around the chest tube site". On assessment the right lower lobe breath sound has crackles. We'll notified MD.

## 2016-05-16 NOTE — Progress Notes (Signed)
Pt ambulated entire unit with no assistance. Pt tolerated activity well and is now resting in bed.   Dakotah Heiman M

## 2016-05-17 ENCOUNTER — Inpatient Hospital Stay (HOSPITAL_COMMUNITY): Payer: 59

## 2016-05-17 LAB — BASIC METABOLIC PANEL
Anion gap: 6 (ref 5–15)
BUN: 17 mg/dL (ref 6–20)
CO2: 26 mmol/L (ref 22–32)
Calcium: 8.2 mg/dL — ABNORMAL LOW (ref 8.9–10.3)
Chloride: 105 mmol/L (ref 101–111)
Creatinine, Ser: 1.08 mg/dL (ref 0.61–1.24)
GFR calc Af Amer: >60 mL/min (ref >60)
GFR calc non Af Amer: >60 mL/min (ref >60)
Glucose, Bld: 130 mg/dL — ABNORMAL HIGH (ref 65–99)
Potassium: 4.2 mmol/L (ref 3.5–5.1)
Sodium: 137 mmol/L (ref 135–145)

## 2016-05-17 LAB — CBC
HCT: 30.7 % — ABNORMAL LOW (ref 39.0–52.0)
Hemoglobin: 9.7 g/dL — ABNORMAL LOW (ref 13.0–17.0)
MCH: 28.4 pg (ref 26.0–34.0)
MCHC: 31.6 g/dL (ref 30.0–36.0)
MCV: 90 fL (ref 78.0–100.0)
Platelets: 575 10*3/uL — ABNORMAL HIGH (ref 150–400)
RBC: 3.41 MIL/uL — ABNORMAL LOW (ref 4.22–5.81)
RDW: 15 % (ref 11.5–15.5)
WBC: 13.9 10*3/uL — ABNORMAL HIGH (ref 4.0–10.5)

## 2016-05-17 MED ORDER — IOPAMIDOL (ISOVUE-300) INJECTION 61%
INTRAVENOUS | Status: AC
Start: 2016-05-17 — End: 2016-05-17
  Administered 2016-05-17: 100 mL via INTRAVENOUS
  Filled 2016-05-17: qty 100

## 2016-05-17 NOTE — Progress Notes (Addendum)
      [address]       [place] [id number]             [phone]       4 Days Post-Op Procedure(s) (LRB): VIDEO ASSISTED THORACOSCOPY (VATS)/DRAIN EMPYEMA (Right)  Subjective: Patient without complaints this am.  Objective: Vital signs in last 24 hours: Temp:  [97.7 F (36.5 C)-98.1 F (36.7 C)] 97.7 F (36.5 C) (02/05 0617) Pulse Rate:  [76-94] 94 (02/05 0617) Cardiac Rhythm: Normal sinus rhythm (02/04 2020) Resp:  [16-19] 18 (02/05 0617) BP: (118-152)/(62-80) 152/80 (02/05 0617) SpO2:  [93 %-97 %] 95 % (02/05 0617)     Intake/Output from previous day: 02/04 0701 - 02/05 0700 In: 1160 [P.O.:960; IV Piggyback:200] Out: 850 [Urine:850]   Physical Exam:  Cardiovascular: RRR Pulmonary: Slightly diminished at bases R>L Abdomen: Soft, non tender, bowel sounds present. Extremities: No lower extremity edema. Wounds: Clean and dry.  No erythema or signs of infection. Chest Tube: to suction, no air leak  Lab Results: CBC: Recent Labs  05/16/16 0521 05/17/16 0500  WBC 10.4 13.9*  HGB 8.7* 9.7*  HCT 28.1* 30.7*  PLT 489* 575*   BMET:  Recent Labs  05/15/16 0435 05/16/16 0521  NA 138 140  K 4.2 4.2  CL 109 105  CO2 25 25  GLUCOSE 120* 108*  BUN 19 16  CREATININE 1.01 1.06  CALCIUM 7.8* 8.0*    PT/INR: No results for input(s): LABPROT, INR in the last 72 hours. ABG:  INR: Will add last result for INR, ABG once components are confirmed Will add last 4 CBG results once components are confirmed  Assessment/Plan:  1. CV - SR. On Coreg 3.125 mg bid. 2.  Pulmonary - No recorded output last 24 hours. I marked the pleura vac this am. Chest tube is to suction. CXR this am appears stable (no pneumothorax, small right sided chest wall subcutaneous emphysema, and small right pleural/atelectasis). Hope to remove chest tube soon. Encourage incentive spirometer. 3. ID-on Vancomycin,Clindamycin, and Levaquin. Cultures thus far show gram positive  cocci and gram negative rods. 4. Anemia-H and H increased to 9.7 and 30.7  ZIMMERMAN,DONIELLE MPA-C 05/17/2016,7:37 AM   Chart reviewed, patient examined, agree with above. He has minimal chest tube output so will remove the remaining tube. Continue IV antibiotics pending final culture results.

## 2016-05-17 NOTE — Discharge Summary (Signed)
Physician Discharge Summary  Patient ID: John CrumbleRichard T [name] MRN: [id number] DOB/AGE: 63/10/1955 63 y.o.  Admit date: 05/11/2016 Discharge date: 05/19/2016  Admission Diagnoses: Right pleural effusion/Empyema  Discharge Diagnoses:  Active Problems:   Dyspnea   Pleural effusion   Psoriatic arthritis (HCC)   Empyema [facility](HCC)  Patient Active Problem List   Diagnosis Date Noted  . Empyema (HCC) 05/13/2016  . Psoriatic arthritis (HCC)   . Dyspnea 05/11/2016  . Pleural effusion 05/11/2016    HPI:    John [name]  is a 61 y.o. male,  w psoriasis c/o cough since December 2017,  Pt was seen in ER and tx with omnicef and zithromax.  Pt then continued to have cough and seen in office, and tx with.   Pt presented today due to worsening dyspnea and was found to have right sided pleural effusion on CXR.  Pt sent to hospital for direct admission.  Pt has not taken methotrexate for the past 2 weeks due to feeling poorly.     [facility] which shows a loculated pleural effusion.  Pt has been afebrile,  CT scan also showed possible right renal mass incompletely visualized on CT chest.  Pt will be admitted for dyspnea from pleural effusion  ? Empyema.  Ultrasound guided thoracentesis has been ordered.  Discussed case with pulmonary who recommended IV abx.  Will request CT surgery input as well.      Discharged Condition: good  Hospital Course: The patient was admitted for further treatment and management. A CT scan of the chest showed a loculated pleural effusion consistent with possible empyema. Pulmonary medicine and CT surgery consultations were obtained. He was determined the patient would benefit from proceeding with drainage and decortication. On 05/13/2016 he was taken the operating room where he underwent the below described procedure. He tolerated it well and was taken to the post anesthesia care unit in stable condition  Postoperative hospital course:  Cultures have thus far  grown gram-positive cocci and gram-negative rods without specific organism. He is been on intravenous vancomycin, clindamycin and Levaquin. He has remained hemodynamically stable. Renal function has been normal. Blood sugars have been under adequate control. He does have an anemia which is stable. He does not have a leukocytosis currently. The chest tube has been removed  and at time of discharge he is felt to be quite stable. The organism cultured is peptostreptococcus.  Consults: pulmonary/intensive care  Significant Diagnostic Studies: chest CT scan  Treatments: surgery:  John [name] [id number] 05/13/2016   Preoperative Dx:  Large right empyema  Postoperative Dx: Same   Procedure: Right video-assisted thoracoscopy, drainage of empyema, decortication  Surgeon: Dr. Alleen BorneBryan K. Bartle   Assistant: Doree Fudgeonielle Zimmerman, PA-C  Anesthesia: GET   Discharge Exam: Blood pressure 133/86, pulse 85, temperature 98.7 F (37.1 C), temperature source Oral, resp. rate 18, height 5\' 11"  (1.803 m), weight 243 lb 13.3 oz (110.6 kg), SpO2 94 %.  General appearance: alert, cooperative and no distress Heart: regular rate and rhythm Lungs: mildly dim in right base Abdomen: benign Extremities: warm Wound: incis healing well Disposition: 01-Home or Self Care    Results for orders placed or performed during the hospital encounter of 05/11/16  Culture, blood (routine x 2)     Status: None   Collection Time: 05/11/16  3:47 PM  Result Value Ref Range Status   Specimen Description BLOOD RIGHT ARM  Final   Special Requests BOTTLES DRAWN AEROBIC AND ANAEROBIC 10CC  Final   Culture   Final    NO GROWTH 5 DAYS Performed at [facility], [address] [place], [place] [address]    Report Status 05/16/2016 FINAL  Final  Culture, blood (routine x 2)     Status: None   Collection Time: 05/11/16  3:47 PM  Result Value Ref Range Status   Specimen Description BLOOD LEFT ARM  Final   Special  Requests BOTTLES DRAWN AEROBIC ONLY 5CC  Final   Culture   Final    NO GROWTH 5 DAYS Performed at [facility], [address], [place], [place] [address]    Report Status 05/16/2016 FINAL  Final  Fungus Culture With Stain     Status: None (Preliminary result)   Collection Time: 05/13/16  8:42 AM  Result Value Ref Range Status   Fungus Stain Final report  Final    Comment: (NOTE) Performed At: [facility] [address] [place], [place] [id number] Mila Homer MD [id number]    Fungus (Mycology) Culture PENDING  Incomplete   Fungal Source PLEURAL  Final    Comment: RIGHT  Acid Fast Smear (AFB)     Status: None   Collection Time: 05/13/16  8:42 AM  Result Value Ref Range Status   AFB Specimen Processing Concentration  Final   Acid Fast Smear Negative  Final    Comment: (NOTE) Performed At: [facility] [address] [place], [place] [id number] Mila Homer MD [id number]    Source (AFB) PLEURAL  Final    Comment: RIGHT  Culture, body fluid-bottle     Status: Abnormal   Collection Time: 05/13/16  8:42 AM  Result Value Ref Range Status   Specimen Description PLEURAL RIGHT  Final   Special Requests NONE  Final   Gram Stain   Final    GRAM POSITIVE COCCI IN PAIRS GRAM NEGATIVE RODS IN BOTH AEROBIC AND ANAEROBIC BOTTLES    Culture PEPTOSTREPTOCOCCUS MICROS (A)  Final   Report Status 05/18/2016 FINAL  Final  Gram stain     Status: None   Collection Time: 05/13/16  8:42 AM  Result Value Ref Range Status   Specimen Description PLEURAL RIGHT  Final   Special Requests NONE  Final   Gram Stain   Final    FEW WBC PRESENT, PREDOMINANTLY PMN ABUNDANT GRAM NEGATIVE RODS ABUNDANT GRAM POSITIVE COCCI IN PAIRS ABUNDANT GRAM VARIABLE ROD    Report Status 05/13/2016 FINAL  Final  Fungus Culture Result     Status: None   Collection Time: 05/13/16  8:42 AM  Result Value Ref Range Status   Result 1 Comment  Final    Comment: (NOTE) KOH/Calcofluor  preparation:  no fungus observed. Performed At: [facility] [address] [place], [place] [id number] Mila Homer MD [id number]   MRSA PCR Screening     Status: None   Collection Time: 05/13/16  2:54 PM  Result Value Ref Range Status   MRSA by PCR NEGATIVE NEGATIVE Final    Comment:        The GeneXpert MRSA Assay (FDA approved for NASAL specimens only), is one component of a comprehensive MRSA colonization surveillance program. It is not intended to diagnose MRSA infection nor to guide or monitor treatment for MRSA infections.    Allergies as of 05/19/2016      Reactions   Meropenem Rash   Penicillins Rash   ++ pt's wife reported he tolerated keflex in the past++ Has  patient had a PCN reaction causing immediate rash, facial/tongue/throat swelling, SOB or lightheadedness with hypotension: No Has patient had a PCN reaction causing severe rash involving mucus membranes or skin necrosis: No Has patient had a PCN reaction that required hospitalization No Has patient had a PCN reaction occurring within the last 10 years: No If all of the above answers are "NO", then may proceed with Cephalosporin use.      Medication List    STOP taking these medications   azithromycin 250 MG tablet Commonly known as:  ZITHROMAX   cefdinir 300 MG capsule Commonly known as:  OMNICEF   methotrexate 1 g injection Commonly known as:  50 mg/ml   predniSONE 50 MG tablet Commonly known as:  DELTASONE     TAKE these medications   albuterol 108 (90 Base) MCG/ACT inhaler Commonly known as:  PROVENTIL HFA;VENTOLIN HFA Inhale 2 puffs into the lungs every 4 (four) hours as needed for wheezing or shortness of breath.   atomoxetine 100 MG capsule Commonly known as:  STRATTERA Take 100 mg by mouth daily.   benzonatate 100 MG capsule Commonly known as:  TESSALON Take 100 mg by mouth 3 (three) times daily as needed for cough.   buPROPion 300 MG 24 hr tablet Commonly known as:   WELLBUTRIN XL Take 300 mg by mouth daily.   carvedilol 3.125 MG tablet Commonly known as:  COREG Take 3.125 mg by mouth 2 (two) times daily with a meal.   clindamycin 300 MG capsule Commonly known as:  CLEOCIN Take 1 capsule (300 mg total) by mouth 3 (three) times daily.   fish oil-omega-3 fatty acids 1000 MG capsule Take 2 g by mouth daily.   FLUoxetine 20 MG tablet Commonly known as:  PROZAC Take 60 mg by mouth daily.   glucosamine-chondroitin 500-400 MG tablet Take 1 tablet by mouth 3 (three) times daily.   l-methylfolate-B6-B12 3-35-2 MG Tabs tablet Commonly known as:  METANX Take 1 tablet by mouth daily.   losartan 25 MG tablet Commonly known as:  COZAAR Take 25 mg by mouth daily.   multivitamin with minerals tablet Take 1 tablet by mouth daily.   oxyCODONE 5 MG immediate release tablet Commonly known as:  Oxy IR/ROXICODONE Take 1 tablet (5 mg total) by mouth every 6 (six) hours as needed for severe pain.      Follow-up Information    Alleen Borne, MD Follow up.   Specialty:  Cardiothoracic Surgery Why:  05/26/2016 at 11:15 am to see the surgeon. Please obtain a chest xray at [facility] imaging at 10:45 am . It is located in the same office complex. Contact information: [address] [employer] [address] [place] [address] [phone]           Signed: Rowe Clack 05/19/2016, 12:13 PM

## 2016-05-17 NOTE — Progress Notes (Signed)
Patient ID: John [name], male   DOB: 08/05/1955, 61 y.o.   MRN: [id number]                                                               PROGRESS NOTE                                                                                                                                                                                                             Patient Demographics:    John [name], is a 61 y.o. male, DOB - 11/06/1955, WUJ:[id number]  Admit date - 05/11/2016   Admitting Physician Pearson GrippeJames Alfhild Partch, MD  Outpatient Primary MD for the patient is Pearson GrippeJames Zackary Mckeone, MD  LOS - 6  Outpatient Specialists: Alben DeedsJames Beekman (rheumatology)  No chief complaint on file.    Dyspnea  Brief Narrative  60 y.o.male,w psoriasis c/o cough since December 2017, Pt was seen in ER and tx with omnicef and zithromax. Pt then continued to have cough and seen in office, and tx with. Pt presented today due to worsening dyspnea and was found to have right sided pleural effusion on CXR. Pt sent to hospital for direct admission. Pt has not taken methotrexate for the past 2 weeks due to feeling poorly.   [facility] which shows a loculated pleural effusion. Pt has been afebrile, CT scan also showed possible right renal mass incompletely visualized on CT chest. Pt will be admitted for dyspnea from pleural effusion ? Empyema. Ultrasound guided thoracentesis has been ordered.  Discussed case with pulmonary who recommended IV abx. Pt seen by CT surgery who recommended VATS and decortication of empyema      Subjective:    John [name] today has been afebrile overnite.    Assessment  & Plan :    Active Problems:   Dyspnea   Pleural effusion   Psoriatic arthritis (HCC)   Empyema (HCC)  1. R pleural effusion/empyema s/p VATS/ decortication 2/1 Awaiting cultures, (gram stain gram+ cocci , gram negative rods) Cont iv abx, vanco, clindamycin, levaquin Appreciate CT surgery and pulmonary  input   2. R renal lesion CT scan abd/ pelvis  3. Psoriasis Methotrexate on hold  4. Hypertension Cont current bp medications.   5. Abnormal liver function improving  6. Anemia Stable, awaiting  repeat cbc this am  Code Status:FULL CODE  Family Communication :w patient and wife  Disposition Plan:home  Barriers For Discharge:  Consults :pulmonary, CT surgery  Procedures : R VATS/decortication 05/13/2016  DVT Prophylaxis: -SCDs,lovenox Antibiotics : vanco 1/30=> levaquin 1/30=> Meropenem 1/30=>2/2 Clindamycin 2/2=>     Lab Results  Component Value Date   PLT 489 (H) 05/16/2016      Anti-infectives    Start     Dose/Rate Route Frequency Ordered Stop   05/15/16 2200  clindamycin (CLEOCIN) IVPB 600 mg     600 mg 100 mL/hr over 30 Minutes Intravenous Every 8 hours 05/15/16 2009     05/14/16 2200  clindamycin (CLEOCIN) IVPB 600 mg  Status:  Discontinued     600 mg 100 mL/hr over 30 Minutes Intravenous Every 8 hours 05/14/16 1941 05/15/16 1945   05/14/16 2000  levofloxacin (LEVAQUIN) IVPB 750 mg     750 mg 100 mL/hr over 90 Minutes Intravenous Every 24 hours 05/14/16 1514     05/13/16 1900  levofloxacin (LEVAQUIN) IVPB 750 mg  Status:  Discontinued     750 mg 100 mL/hr over 90 Minutes Intravenous Every 48 hours 05/13/16 1527 05/14/16 1514   05/13/16 1630  vancomycin (VANCOCIN) IVPB 1000 mg/200 mL premix     1,000 mg 200 mL/hr over 60 Minutes Intravenous Every 12 hours 05/13/16 1527     05/12/16 0800  vancomycin (VANCOCIN) IVPB 750 mg/150 ml premix  Status:  Discontinued     750 mg 150 mL/hr over 60 Minutes Intravenous Every 12 hours 05/11/16 1838 05/13/16 1433   05/11/16 2000  meropenem (MERREM) 1 g in sodium chloride 0.9 % 100 mL IVPB  Status:  Discontinued     1 g 200 mL/hr over 30 Minutes Intravenous Every 8 hours 05/11/16 1837 05/13/16 1433   05/11/16 1900  vancomycin (VANCOCIN) 2,500 mg in sodium chloride 0.9 %  500 mL IVPB     2,500 mg 250 mL/hr over 120 Minutes Intravenous STAT 05/11/16 1836 05/11/16 2233   05/11/16 1900  levofloxacin (LEVAQUIN) IVPB 750 mg  Status:  Discontinued     750 mg 100 mL/hr over 90 Minutes Intravenous Every 24 hours 05/11/16 1836 05/13/16 1433        Objective:   Vitals:   05/16/16 1213 05/16/16 1616 05/16/16 2008 05/16/16 2014  BP: 118/69  (!) 143/62   Pulse: 76  82   Resp: 19 17 18 18   Temp: 98 F (36.7 C)  97.9 F (36.6 C)   TempSrc: Oral  Oral   SpO2: 95% 93% 97% 97%  Weight:      Height:        Wt Readings from Last 3 Encounters:  05/14/16 110.6 kg (243 lb 13.3 oz)  06/19/14 117.9 kg (260 lb)  02/19/12 117 kg (258 lb)     Intake/Output Summary (Last 24 hours) at 05/17/16 0539 Last data filed at 05/17/16 0200  Gross per 24 hour  Intake             1160 ml  Output             1040 ml  Net              120 ml     Physical Exam  Awake Alert, Oriented X 3, No new F.N deficits, Normal affect [place].AT,PERRAL Supple Neck,No JVD, No cervical lymphadenopathy appriciated.  Symmetrical Chest wall movement, Good air movement bilaterally, CTAB RRR,No Gallops,Rubs or new Murmurs, No Parasternal  Heave +ve B.Sounds, Abd Soft, No tenderness, No organomegaly appriciated, No rebound - guarding or rigidity. No Cyanosis, Clubbing or edema, No new Rash or bruise   Chest tube    Data Review:    CBC  Recent Labs Lab 05/11/16 1622 05/12/16 0413 05/13/16 1025 05/14/16 0422 05/15/16 0435 05/16/16 0521  WBC 18.9* 17.2* 22.1* 13.2* 9.8 10.4  HGB 9.9* 9.7* 8.9* 8.3* 8.1* 8.7*  HCT 31.3* 30.4* 29.3* 27.2* 26.1* 28.1*  PLT 536* 494* 539* 397 388 489*  MCV 90.5 89.1 91.3 91.3 91.3 90.4  MCH 28.6 28.4 27.7 27.9 28.3 28.0  MCHC 31.6 31.9 30.4 30.5 31.0 31.0  RDW 14.4 14.5 14.6 14.7 14.7 15.0  LYMPHSABS 0.9  --   --   --   --   --   MONOABS 1.9*  --   --   --   --   --   EOSABS 0.0  --   --   --   --   --   BASOSABS 0.0  --   --   --   --   --      Chemistries   Recent Labs Lab 05/11/16 1622 05/12/16 0413 05/14/16 0422 05/15/16 0435 05/16/16 0521  NA 136 135 136 138 140  K 4.2 4.3 4.1 4.2 4.2  CL 101 104 105 109 105  CO2 26 21* 23 25 25   GLUCOSE 139* 148* 152* 120* 108*  BUN 24* 19 18 19 16   CREATININE 0.90 0.72 0.88 1.01 1.06  CALCIUM 8.2* 7.9* 7.7* 7.8* 8.0*  AST 122* 114* 64* 55*  --   ALT 114* 124* 85* 74*  --   ALKPHOS 151* 150* 106 98  --   BILITOT 0.9 1.1 0.7 0.3  --    ------------------------------------------------------------------------------------------------------------------ No results for input(s): CHOL, HDL, LDLCALC, TRIG, CHOLHDL, LDLDIRECT in the last 72 hours.  No results found for: HGBA1C ------------------------------------------------------------------------------------------------------------------ No results for input(s): TSH, T4TOTAL, T3FREE, THYROIDAB in the last 72 hours.  Invalid input(s): FREET3 ------------------------------------------------------------------------------------------------------------------ No results for input(s): VITAMINB12, FOLATE, FERRITIN, TIBC, IRON, RETICCTPCT in the last 72 hours.  Coagulation profile  Recent Labs Lab 05/11/16 1622  INR 1.18    No results for input(s): DDIMER in the last 72 hours.  Cardiac Enzymes No results for input(s): CKMB, TROPONINI, MYOGLOBIN in the last 168 hours.  Invalid input(s): CK ------------------------------------------------------------------------------------------------------------------ No results found for: BNP  Inpatient Medications  Scheduled Meds: . acetaminophen  1,000 mg Oral Q6H   Or  . acetaminophen (TYLENOL) oral liquid 160 mg/5 mL  1,000 mg Oral Q6H  . bisacodyl  10 mg Oral Daily  . buPROPion  300 mg Oral Daily  . carvedilol  3.125 mg Oral BID WC  . clindamycin (CLEOCIN) IV  600 mg Intravenous Q8H  . enoxaparin (LOVENOX) injection  40 mg Subcutaneous Daily  . fentaNYL   Intravenous Q4H  .  FLUoxetine  60 mg Oral Daily  . levofloxacin (LEVAQUIN) IV  750 mg Intravenous Q24H  . saccharomyces boulardii  250 mg Oral BID  . senna-docusate  1 tablet Oral QHS  . vancomycin  1,000 mg Intravenous Q12H   Continuous Infusions: PRN Meds:.diphenhydrAMINE **OR** diphenhydrAMINE, ketorolac, levalbuterol, naloxone **AND** sodium chloride flush, ondansetron (ZOFRAN) IV, ondansetron (ZOFRAN) IV, potassium chloride (KCL MULTIRUN) 30 mEq in 265 mL IVPB, sodium chloride flush  Micro Results Recent Results (from the past 240 hour(s))  Culture, blood (routine x 2)     Status: None   Collection Time: 05/11/16  3:47  PM  Result Value Ref Range Status   Specimen Description BLOOD RIGHT ARM  Final   Special Requests BOTTLES DRAWN AEROBIC AND ANAEROBIC 10CC  Final   Culture   Final    NO GROWTH 5 DAYS Performed at [facility], [address], [place], [place] [address]    Report Status 05/16/2016 FINAL  Final  Culture, blood (routine x 2)     Status: None   Collection Time: 05/11/16  3:47 PM  Result Value Ref Range Status   Specimen Description BLOOD LEFT ARM  Final   Special Requests BOTTLES DRAWN AEROBIC ONLY 5CC  Final   Culture   Final    NO GROWTH 5 DAYS Performed at [facility], [address] [place], [place] [address]    Report Status 05/16/2016 FINAL  Final  Fungus Culture With Stain     Status: None (Preliminary result)   Collection Time: 05/13/16  8:42 AM  Result Value Ref Range Status   Fungus Stain Final report  Final    Comment: (NOTE) Performed At: [facility] [address] [place], [place] [id number] Mila Homer MD AO:[id number]    Fungus (Mycology) Culture PENDING  Incomplete   Fungal Source PLEURAL  Final    Comment: RIGHT  Acid Fast Smear (AFB)     Status: None   Collection Time: 05/13/16  8:42 AM  Result Value Ref Range Status   AFB Specimen Processing Concentration  Final   Acid Fast Smear Negative  Final    Comment:  (NOTE) Performed At: [facility] [address] [place], [place] [id number] Mila Homer MD LK:[id number]    Source (AFB) PLEURAL  Final    Comment: RIGHT  Culture, body fluid-bottle     Status: None (Preliminary result)   Collection Time: 05/13/16  8:42 AM  Result Value Ref Range Status   Specimen Description PLEURAL RIGHT  Final   Special Requests NONE  Final   Gram Stain   Final    GRAM POSITIVE COCCI IN PAIRS GRAM NEGATIVE RODS IN BOTH AEROBIC AND ANAEROBIC BOTTLES    Culture HOLDING FOR POSSIBLE ANAEROBE  Final   Report Status PENDING  Incomplete  Gram stain     Status: None   Collection Time: 05/13/16  8:42 AM  Result Value Ref Range Status   Specimen Description PLEURAL RIGHT  Final   Special Requests NONE  Final   Gram Stain   Final    FEW WBC PRESENT, PREDOMINANTLY PMN ABUNDANT GRAM NEGATIVE RODS ABUNDANT GRAM POSITIVE COCCI IN PAIRS ABUNDANT GRAM VARIABLE ROD    Report Status 05/13/2016 FINAL  Final  Fungus Culture Result     Status: None   Collection Time: 05/13/16  8:42 AM  Result Value Ref Range Status   Result 1 Comment  Final    Comment: (NOTE) KOH/Calcofluor preparation:  no fungus observed. Performed At: [facility] [address] [place], [place] [id number] Mila Homer MD QV:[id number]   MRSA PCR Screening     Status: None   Collection Time: 05/13/16  2:54 PM  Result Value Ref Range Status   MRSA by PCR NEGATIVE NEGATIVE Final    Comment:        The GeneXpert MRSA Assay (FDA approved for NASAL specimens only), is one component of a comprehensive MRSA colonization surveillance program. It is not intended to diagnose MRSA infection nor to guide or monitor treatment for MRSA infections.     Radiology Reports  Ct Chest W Contrast  Result Date: 05/11/2016 CLINICAL DATA:  Extensive right pleural effusion EXAM: CT CHEST WITH CONTRAST TECHNIQUE: Multidetector CT imaging of the chest was performed during intravenous  contrast administration. CONTRAST:  75mL ISOVUE-300 IOPAMIDOL (ISOVUE-300) INJECTION 61% COMPARISON:  Chest x-ray from earlier in the same day, 04/26/2016 FINDINGS: Cardiovascular: Thoracic aorta demonstrates some calcific changes without aneurysmal dilatation or dissection. Pulmonary artery shows no large central filling defects although timing for emboli it was not performed. Cardiac structures are within normal limits. Mediastinum/Nodes: The thoracic inlet shows no acute abnormality. Scattered small lymph nodes are noted throughout the mediastinum likely of reactive nature. No sizable lymph nodes are seen. Lungs/Pleura: The left lung is within normal limits. The right hemithorax demonstrates evidence of a large loculated pleural effusion within air-fluid level most consistent with empyema. Some associated right lower lobe and right middle lobe atelectatic changes are seen. No parenchymal nodules are noted. Upper Abdomen: Visualized upper abdomen shows a masslike density measuring 4.8 cm in the upper pole of the left kidney this is incompletely evaluated on this exam. No other focal abnormality in the upper abdomen is seen. Musculoskeletal: Bony structures show degenerative change of the thoracic spine. No acute bony abnormality is noted. IMPRESSION: Loculated pleural fluid collection with air-fluid level consistent with an empyema. Tube drainage is recommended. Suggestion of a right upper pole renal mass as described. This is incompletely evaluated on this exam. CT of the abdomen and pelvis with contrast material is recommended for further evaluation. These results will be called to the ordering clinician or representative by the Radiologist Assistant, and communication documented in the PACS or zVision Dashboard. Electronically Signed   By: Alcide Clever M.D.   On: 05/11/2016 17:33   Dg Chest Port 1 View  Result Date: 05/16/2016 CLINICAL DATA:  61 year old male status post right VATS for empyema postoperative  day 3. Initial encounter. EXAM: PORTABLE CHEST 1 VIEW COMPARISON:  05/15/2016 and earlier. FINDINGS: Portable AP semi upright view at 0621 hours. 2 right chest tubes are stable. Small volume of subcutaneous gas along the right chest wall is mildly increased. Stable right IJ central line. Mildly improved lung volumes. Normal cardiac size and mediastinal contours. Decreasing streaky right mid and lower lung opacity. Minimal left lung base atelectasis is stable. IMPRESSION: 1.  Stable lines and tubes.  No pneumothorax. 2. Decreasing streaky right lung opacity. Stable mild left lung base atelectasis. Electronically Signed   By: Odessa Fleming M.D.   On: 05/16/2016 07:41   Dg Chest Port 1 View  Result Date: 05/15/2016 CLINICAL DATA:  61 year old male status post VATS for right empyema postoperative day 2. Initial encounter. EXAM: PORTABLE CHEST 1 VIEW COMPARISON:  05/14/2016 and earlier. FINDINGS: Seated AP portable view at 0521 hours. Stable right IJ central line. Two right chest tubes remain in place. No pneumothorax identified. Stable lung volumes. Decreasing Patchy and confluent opacity in the right lung. Stable left lung base streaky opacity which most resembles atelectasis. No areas of worsening ventilation. No superimposed pulmonary edema. Stable cardiac size and mediastinal contours. IMPRESSION: 1. Stable lines and tubes. Two right chest tubes in place, no pneumothorax identified. 2. Improving right lung ventilation. Stable mild left lung base atelectasis. Electronically Signed   By: Odessa Fleming M.D.   On: 05/15/2016 07:12   Dg Chest Port 1 View  Result Date: 05/14/2016 CLINICAL DATA:  Empyema, chest 2 EXAM: PORTABLE CHEST 1 VIEW COMPARISON:  Chest radiograph from one day prior. FINDINGS: Superior most right  chest tube terminates over the mid to upper right pleural space. Basilar right chest tube is in place. Right internal jugular central venous catheter terminates in the upper third of the superior vena cava. Stable  cardiomediastinal silhouette with normal heart size. No pneumothorax. Stable mild pleural thickening in the peripheral right pleural space. No left pleural effusion. No overt pulmonary edema. Stable low lung volumes. Stable patchy opacity in the mid to lower right lung. Mild left basilar atelectasis. Subcutaneous emphysema in the lateral lower right chest wall. IMPRESSION: 1. No pneumothorax. Right chest tubes in place. Stable peripheral right pleural thickening. 2. Stable low lung volumes. Stable patchy mid to lower right lung opacity and mild left basilar atelectasis. Electronically Signed   By: Delbert Phenix M.D.   On: 05/14/2016 07:36   Dg Chest Port 1 View  Result Date: 05/13/2016 CLINICAL DATA:  LEFT lung surgery.  Empyema EXAM: PORTABLE CHEST 1 VIEW COMPARISON:  Radiograph 05/11/2016 FINDINGS: Normal cardiac silhouette. Interval placement of 2 RIGHT chest tubes, 1 directed superiorly and 1 directed inferiorly. There is marked reduction in volume of the fluid collection at the RIGHT lung base with only small residual fluid and atelectasis evident by a radiograph. No visible pneumothorax. LEFT lung clear. RIGHT central venous line. IMPRESSION: Marked reduction in volume of fluid within the cavitary lesion in the RIGHT lower lobe following two chest tube placement. Electronically Signed   By: Genevive Bi M.D.   On: 05/13/2016 10:46    Time Spent in minutes  30   Pearson Grippe M.D on 05/17/2016 at 5:39 AM  Between 7am to 7am - Pager - [phone]

## 2016-05-17 NOTE — Discharge Instructions (Signed)
Community-Acquired Pneumonia, Adult Introduction Pneumonia is an infection of the lungs. One type of pneumonia can happen while a person is in a hospital. A different type can happen when a person is not in a hospital (community-acquired pneumonia). It is easy for this kind to spread from person to person. It can spread to you if you breathe near an infected person who coughs or sneezes. Some symptoms include:  A dry cough.  A wet (productive) cough.  Fever.  Sweating.  Chest pain. Follow these instructions at home:  Take over-the-counter and prescription medicines only as told by your doctor.  Only take cough medicine if you are losing sleep.  If you were prescribed an antibiotic medicine, take it as told by your doctor. Do not stop taking the antibiotic even if you start to feel better.  Sleep with your head and neck raised (elevated). You can do this by putting a few pillows under your head, or you can sleep in a recliner.  Do not use tobacco products. These include cigarettes, chewing tobacco, and e-cigarettes. If you need help quitting, ask your doctor.  Drink enough water to keep your pee (urine) clear or pale yellow. A shot (vaccine) can help prevent pneumonia. Shots are often suggested for:  People older than 63 years of age.  People older than 63 years of age:  Who are having cancer treatment.  Who have long-term (chronic) lung disease.  Who have problems with their body's defense system (immune system). You may also prevent pneumonia if you take these actions:  Get the flu (influenza) shot every year.  Go to the dentist as often as told.  Wash your hands often. If soap and water are not available, use hand sanitizer. Contact a doctor if:  You have a fever.  You lose sleep because your cough medicine does not help. Get help right away if:  You are short of breath and it gets worse.  You have more chest pain.  Your sickness gets worse. This is very  serious if:  You are an older adult.  Your body's defense system is weak.  You cough up blood. This information is not intended to replace advice given to you by your health care provider. Make sure you discuss any questions you have with your health care provider. Document Released: 09/15/2007 Document Revised: 09/04/2015 Document Reviewed: 07/24/2014  2017 Elsevier Thoracoscopy, Care After Refer to this sheet in the next few weeks. These instructions provide you with information about caring for yourself after your procedure. Your health care provider may also give you more specific instructions. Your treatment has been planned according to current medical practices, but problems sometimes occur. Call your health care provider if you have any problems or questions after your procedure. What can I expect after the procedure? After your procedure, it is common to feel sore for up to two weeks. Follow these instructions at home:  There are many different ways to close and cover an incision, including stitches (sutures), skin glue, and adhesive strips. Follow your health care provider's instructions about:  Incision care.  Bandage (dressing) changes and removal.  Incision closure removal.  Check your incision area every day for signs of infection. Watch for:  Redness, swelling, or pain.  Fluid, blood, or pus.  Take medicines only as directed by your health care provider.  Try to cough often. Coughing helps to protect against lung infection (pneumonia). It may hurt to cough. If this happens, hold a pillow against your chest  when you cough.  Take deep breaths. This also helps to protect against pneumonia.  If you were given an incentive spirometer, use it as directed by your health care provider.  Do not take baths, swim, or use a hot tub until your health care provider approves. You may take showers.  Avoid lifting until your health care provider approves.  Avoid driving until  your health care provider approves.  Do not travel by airplane after the chest tube is removed until your health care provider approves. Contact a health care provider if:  You have a fever.  Pain medicines do not ease your pain.  You have redness, swelling, or increasing pain in your incision area.  You develop a cough that does not go away, or you are coughing up mucus that is yellow or green. Get help right away if:  You have fluid, blood, or pus coming from your incision.  There is a bad smell coming from your incision or dressing.  You develop a rash.  You have difficulty breathing.  You cough up blood.  You develop light-headedness or you feel faint.  You develop chest pain.  Your heartbeat feels irregular or very fast. This information is not intended to replace advice given to you by your health care provider. Make sure you discuss any questions you have with your health care provider. Document Released: 10/16/2004 Document Revised: 11/30/2015 Document Reviewed: 12/12/2013 Elsevier Interactive Patient Education  2017 [employer].

## 2016-05-18 ENCOUNTER — Inpatient Hospital Stay (HOSPITAL_COMMUNITY): Payer: 59

## 2016-05-18 LAB — CULTURE, BODY FLUID W GRAM STAIN -BOTTLE

## 2016-05-18 MED ORDER — OXYCODONE HCL 5 MG PO TABS
5.0000 mg | ORAL_TABLET | ORAL | Status: DC | PRN
  Filled 2016-05-18: qty 1

## 2016-05-18 NOTE — Progress Notes (Signed)
Patient ID: JANN RA, male   DOB: 08-22-1955, 61 y.o.   MRN: [id number]                                                                PROGRESS NOTE                                                                                                                                                                                                             Patient Demographics:    John [name], is a 61 y.o. male, DOB - 1955-08-15, WUJ:[id number]  Admit date - 05/11/2016   Admitting Physician Pearson Grippe, MD  Outpatient Primary MD for the patient is Pearson Grippe, MD  LOS - 7  Outpatient Specialists:  No chief complaint on file. Dyspnea     Brief Narrative   60 y.o.male,w psoriasis c/o cough since December 2017, Pt was seen in ER and tx with omnicef and zithromax. Pt then continued to have cough and seen in office, and tx with. Pt presented today due to worsening dyspnea and was found to have right sided pleural effusion on CXR. Pt sent to hospital for direct admission. Pt has not taken methotrexate for the past 2 weeks due to feeling poorly.   On Arrival to [facility] had CT chest which shows a loculated pleural effusion. Pt has been afebrile, CT scan also showed possible right renal mass incompletely visualized on CT chest. Pt will be admitted for dyspnea from pleural effusion ? Empyema. Ultrasound guided thoracentesis has been ordered.  Discussed case with pulmonary who recommended IV abx. Pt seen by CT surgery who recommended VATS and decortication of empyema     Subjective:    John [name] today has chest tubes removed.  Pt is doing well.  Denies cp, palp, sob, fever, chills.     Assessment  & Plan :    Active Problems:   Dyspnea   Pleural effusion   Psoriatic arthritis (HCC)   Empyema (HCC)  1. R pleural effusion/empyema s/p VATS/ decortication 2/1 Awaiting cultures, (gram stain gram+ cocci , gram negative rods) => peptostreptococcus Cont iv abx, vanco, clindamycin,    d/c levaquin in am Appreciate CT surgery and pulmonary input   2. R renal lesion CT scan abd/ pelvis=> negative  3. Psoriasis Methotrexate on hold  4. Hypertension Cont current bp medications.   5. Abnormal liver function improving  6. Anemia stable Code Status:FULL CODE  Family Communication :w patient and wife  Disposition Plan:home  Barriers For Discharge:  Consults :pulmonary, CT surgery  Procedures : R VATS/decortication 05/13/2016  DVT Prophylaxis: -SCDs,lovenox Antibiotics : vanco 1/30=> levaquin 1/30=> Meropenem 1/30=>2/2 Clindamycin 2/2=>     Anti-infectives    Start     Dose/Rate Route Frequency Ordered Stop   05/15/16 2200  clindamycin (CLEOCIN) IVPB 600 mg     600 mg 100 mL/hr over 30 Minutes Intravenous Every 8 hours 05/15/16 2009     05/14/16 2200  clindamycin (CLEOCIN) IVPB 600 mg  Status:  Discontinued     600 mg 100 mL/hr over 30 Minutes Intravenous Every 8 hours 05/14/16 1941 05/15/16 1945   05/14/16 2000  levofloxacin (LEVAQUIN) IVPB 750 mg     750 mg 100 mL/hr over 90 Minutes Intravenous Every 24 hours 05/14/16 1514     05/13/16 1900  levofloxacin (LEVAQUIN) IVPB 750 mg  Status:  Discontinued     750 mg 100 mL/hr over 90 Minutes Intravenous Every 48 hours 05/13/16 1527 05/14/16 1514   05/13/16 1630  vancomycin (VANCOCIN) IVPB 1000 mg/200 mL premix     1,000 mg 200 mL/hr over 60 Minutes Intravenous Every 12 hours 05/13/16 1527     05/12/16 0800  vancomycin (VANCOCIN) IVPB 750 mg/150 ml premix  Status:  Discontinued     750 mg 150 mL/hr over 60 Minutes Intravenous Every 12 hours 05/11/16 1838 05/13/16 1433   05/11/16 2000  meropenem (MERREM) 1 g in sodium chloride 0.9 % 100 mL IVPB  Status:  Discontinued     1 g 200 mL/hr over 30 Minutes Intravenous Every 8 hours 05/11/16 1837 05/13/16 1433   05/11/16 1900  vancomycin (VANCOCIN) 2,500 mg in sodium chloride 0.9 % 500 mL IVPB     2,500 mg 250  mL/hr over 120 Minutes Intravenous STAT 05/11/16 1836 05/11/16 2233   05/11/16 1900  levofloxacin (LEVAQUIN) IVPB 750 mg  Status:  Discontinued     750 mg 100 mL/hr over 90 Minutes Intravenous Every 24 hours 05/11/16 1836 05/13/16 1433        Objective:   Vitals:   05/17/16 2334 05/18/16 0505 05/18/16 0520 05/18/16 1344  BP:   (!) 147/73 (!) 124/56  Pulse:   89 79  Resp: 17 18 18 18   Temp:   97.6 F (36.4 C) 98.6 F (37 C)  TempSrc:   Oral Oral  SpO2: 96% 96% 96% 95%  Weight:      Height:        Wt Readings from Last 3 Encounters:  05/14/16 110.6 kg (243 lb 13.3 oz)  06/19/14 117.9 kg (260 lb)  02/19/12 117 kg (258 lb)     Intake/Output Summary (Last 24 hours) at 05/18/16 1959 Last data filed at 05/18/16 1230  Gross per 24 hour  Intake             1695 ml  Output             2351 ml  Net             -656 ml     Physical Exam  Awake Alert, Oriented X 3, No new F.N deficits, Normal affect [place].AT,PERRAL Supple Neck,No JVD, No cervical lymphadenopathy appriciated.  Symmetrical Chest wall movement, Good air movement bilaterally, CTAB RRR,No Gallops,Rubs or new Murmurs, No Parasternal Heave +ve B.Sounds, Abd Soft,  No tenderness, No organomegaly appriciated, No rebound - guarding or rigidity. No Cyanosis, Clubbing or edema, No new Rash or bruise      Data Review:    CBC  Recent Labs Lab 05/13/16 1025 05/14/16 0422 05/15/16 0435 05/16/16 0521 05/17/16 0500  WBC 22.1* 13.2* 9.8 10.4 13.9*  HGB 8.9* 8.3* 8.1* 8.7* 9.7*  HCT 29.3* 27.2* 26.1* 28.1* 30.7*  PLT 539* 397 388 489* 575*  MCV 91.3 91.3 91.3 90.4 90.0  MCH 27.7 27.9 28.3 28.0 28.4  MCHC 30.4 30.5 31.0 31.0 31.6  RDW 14.6 14.7 14.7 15.0 15.0    Chemistries   Recent Labs Lab 05/12/16 0413 05/14/16 0422 05/15/16 0435 05/16/16 0521 05/17/16 0500  NA 135 136 138 140 137  K 4.3 4.1 4.2 4.2 4.2  CL 104 105 109 105 105  CO2 21* 23 25 25 26   GLUCOSE 148* 152* 120* 108* 130*  BUN 19 18 19 16  17   CREATININE 0.72 0.88 1.01 1.06 1.08  CALCIUM 7.9* 7.7* 7.8* 8.0* 8.2*  AST 114* 64* 55*  --   --   ALT 124* 85* 74*  --   --   ALKPHOS 150* 106 98  --   --   BILITOT 1.1 0.7 0.3  --   --    ------------------------------------------------------------------------------------------------------------------ No results for input(s): CHOL, HDL, LDLCALC, TRIG, CHOLHDL, LDLDIRECT in the last 72 hours.  No results found for: HGBA1C ------------------------------------------------------------------------------------------------------------------ No results for input(s): TSH, T4TOTAL, T3FREE, THYROIDAB in the last 72 hours.  Invalid input(s): FREET3 ------------------------------------------------------------------------------------------------------------------ No results for input(s): VITAMINB12, FOLATE, FERRITIN, TIBC, IRON, RETICCTPCT in the last 72 hours.  Coagulation profile No results for input(s): INR, PROTIME in the last 168 hours.  No results for input(s): DDIMER in the last 72 hours.  Cardiac Enzymes No results for input(s): CKMB, TROPONINI, MYOGLOBIN in the last 168 hours.  Invalid input(s): CK ------------------------------------------------------------------------------------------------------------------ No results found for: BNP  Inpatient Medications  Scheduled Meds: . bisacodyl  10 mg Oral Daily  . buPROPion  300 mg Oral Daily  . carvedilol  3.125 mg Oral BID WC  . clindamycin (CLEOCIN) IV  600 mg Intravenous Q8H  . enoxaparin (LOVENOX) injection  40 mg Subcutaneous Daily  . FLUoxetine  60 mg Oral Daily  . levofloxacin (LEVAQUIN) IV  750 mg Intravenous Q24H  . saccharomyces boulardii  250 mg Oral BID  . senna-docusate  1 tablet Oral QHS  . vancomycin  1,000 mg Intravenous Q12H   Continuous Infusions: PRN Meds:.levalbuterol, ondansetron (ZOFRAN) IV, oxyCODONE, potassium chloride (KCL MULTIRUN) 30 mEq in 265 mL IVPB, sodium chloride flush  Micro  Results Recent Results (from the past 240 hour(s))  Culture, blood (routine x 2)     Status: None   Collection Time: 05/11/16  3:47 PM  Result Value Ref Range Status   Specimen Description BLOOD RIGHT ARM  Final   Special Requests BOTTLES DRAWN AEROBIC AND ANAEROBIC 10CC  Final   Culture   Final    NO GROWTH 5 DAYS Performed at [facility], [address] [place], [place] [address]    Report Status 05/16/2016 FINAL  Final  Culture, blood (routine x 2)     Status: None   Collection Time: 05/11/16  3:47 PM  Result Value Ref Range Status   Specimen Description BLOOD LEFT ARM  Final   Special Requests BOTTLES DRAWN AEROBIC ONLY 5CC  Final   Culture   Final    NO GROWTH 5 DAYS Performed at  [facility], [address] [place], [place] [address]    Report Status 05/16/2016 FINAL  Final  Fungus Culture With Stain     Status: None (Preliminary result)   Collection Time: 05/13/16  8:42 AM  Result Value Ref Range Status   Fungus Stain Final report  Final    Comment: (NOTE) Performed At: [facility] [address] [place], [place] [id number] Mila Homer MD [id number]    Fungus (Mycology) Culture PENDING  Incomplete   Fungal Source PLEURAL  Final    Comment: RIGHT  Acid Fast Smear (AFB)     Status: None   Collection Time: 05/13/16  8:42 AM  Result Value Ref Range Status   AFB Specimen Processing Concentration  Final   Acid Fast Smear Negative  Final    Comment: (NOTE) Performed At: [facility] [address] [place], [place] [id number] Mila Homer MD LK:[id number]    Source (AFB) PLEURAL  Final    Comment: RIGHT  Culture, body fluid-bottle     Status: Abnormal   Collection Time: 05/13/16  8:42 AM  Result Value Ref Range Status   Specimen Description PLEURAL RIGHT  Final   Special Requests NONE  Final   Gram Stain   Final    GRAM POSITIVE COCCI IN PAIRS GRAM NEGATIVE RODS IN BOTH AEROBIC AND ANAEROBIC BOTTLES    Culture  PEPTOSTREPTOCOCCUS MICROS (A)  Final   Report Status 05/18/2016 FINAL  Final  Gram stain     Status: None   Collection Time: 05/13/16  8:42 AM  Result Value Ref Range Status   Specimen Description PLEURAL RIGHT  Final   Special Requests NONE  Final   Gram Stain   Final    FEW WBC PRESENT, PREDOMINANTLY PMN ABUNDANT GRAM NEGATIVE RODS ABUNDANT GRAM POSITIVE COCCI IN PAIRS ABUNDANT GRAM VARIABLE ROD    Report Status 05/13/2016 FINAL  Final  Fungus Culture Result     Status: None   Collection Time: 05/13/16  8:42 AM  Result Value Ref Range Status   Result 1 Comment  Final    Comment: (NOTE) KOH/Calcofluor preparation:  no fungus observed. Performed At: [facility] [address] [place], [place] [id number] Mila Homer MD QV:[id number]   MRSA PCR Screening     Status: None   Collection Time: 05/13/16  2:54 PM  Result Value Ref Range Status   MRSA by PCR NEGATIVE NEGATIVE Final    Comment:        The GeneXpert MRSA Assay (FDA approved for NASAL specimens only), is one component of a comprehensive MRSA colonization surveillance program. It is not intended to diagnose MRSA infection nor to guide or monitor treatment for MRSA infections.     Radiology Reports Ct Abdomen Pelvis W Wo Contrast  Result Date: 05/17/2016 CLINICAL DATA:  Renal mass identified on chest CT. EXAM: CT ABDOMEN AND PELVIS WITHOUT AND WITH CONTRAST TECHNIQUE: Multidetector CT imaging of the abdomen and pelvis was performed following the standard protocol before and following the bolus administration of intravenous contrast. CONTRAST:  ISOVUE-300 IOPAMIDOL (ISOVUE-300) INJECTION 61% COMPARISON:  Chest CT 05/11/2016. No prior dedicated abdominal imaging. FINDINGS: Lower chest: Right base atelectasis. Right chest tube in place with trace right pleural fluid and air. Normal heart size. Hepatobiliary: Mild hepatic steatosis, without focal liver lesion. Hepatomegaly at 21.0 cm craniocaudal. Normal  gallbladder, without biliary ductal dilatation. Pancreas: Normal, without mass or ductal dilatation. Spleen: Normal in size, without focal abnormality. Adrenals/Urinary  Tract: Normal adrenal glands. No renal calculi or hydronephrosis. No evidence of renal mass, including in the area of concern in the medial upper pole right kidney. Normal urinary bladder. Stomach/Bowel: Normal stomach, without wall thickening. Scattered colonic diverticula. Normal terminal ileum and appendix. Normal small bowel. Vascular/Lymphatic: Atherosclerotic irregularity at the origin of the SMA on image 54/series 6. There is also aortic atherosclerosis with non aneurysmal dilatation of the infrarenal segment including at 2.7 cm. No abdominopelvic adenopathy. Reproductive: Normal prostate. Other: No significant free fluid. Bilateral small fat containing inguinal hernias. A periumbilical fat containing hernia is tiny. Musculoskeletal: Bilateral L5 pars defects. Trace L5-S1 anterolisthesis with moderate lumbosacral spondylosis. IMPRESSION: 1. No evidence of renal mass. 2. Hepatic steatosis and hepatomegaly. 3. Aortic atherosclerosis with mild irregularity involving the superior mesenteric artery at its origin. 4. Right chest tube in place with trace pleural fluid and air. Electronically Signed   By: Jeronimo Greaves M.D.   On: 05/17/2016 08:00   Dg Chest 2 View  Result Date: 05/18/2016 CLINICAL DATA:  Empyema.  History of right lung surgery EXAM: CHEST  2 VIEW COMPARISON:  Yesterday FINDINGS: Right chest tube has been removed. No pneumothorax or re- accumulated pleural fluid. Right IJ central line is likely clamped at the neck; position is stable. Unchanged right lung opacity from atelectasis. Left chest is clear. Normal heart size. IMPRESSION: Right chest tube removal. No pneumothorax or increasing pleural fluid. Electronically Signed   By: Marnee Spring M.D.   On: 05/18/2016 07:42   Dg Chest 2 View  Result Date: 05/17/2016 CLINICAL DATA:   Status post empyema drainage via thoracoscopy 4 days ago. EXAM: CHEST  2 VIEW COMPARISON:  Portable chest x-ray of May 16, 2016 FINDINGS: The lungs are slightly better inflated today. There is decreased interstitial density in the right lung. The lower right chest tube is been removed. The upper tube persists with its tip projecting over the posterior aspect of the right 6 rib. There is no pneumothorax nor large pleural effusion. The left lung is clear. The heart and pulmonary vascularity are normal. The right internal jugular venous catheter tip projects over the proximal SVC. IMPRESSION: Decreased interstitial density on the right. Small remaining right pleural effusion. No pneumothorax. The remaining chest tube is in reasonable position. Electronically Signed   By: David  Swaz[place]and M.D.   On: 05/17/2016 07:40   Ct Chest W Contrast  Result Date: 05/11/2016 CLINICAL DATA:  Extensive right pleural effusion EXAM: CT CHEST WITH CONTRAST TECHNIQUE: Multidetector CT imaging of the chest was performed during intravenous contrast administration. CONTRAST:  75mL ISOVUE-300 IOPAMIDOL (ISOVUE-300) INJECTION 61% COMPARISON:  Chest x-ray from earlier in the same day, 04/26/2016 FINDINGS: Cardiovascular: Thoracic aorta demonstrates some calcific changes without aneurysmal dilatation or dissection. Pulmonary artery shows no large central filling defects although timing for emboli it was not performed. Cardiac structures are within normal limits. Mediastinum/Nodes: The thoracic inlet shows no acute abnormality. Scattered small lymph nodes are noted throughout the mediastinum likely of reactive nature. No sizable lymph nodes are seen. Lungs/Pleura: The left lung is within normal limits. The right hemithorax demonstrates evidence of a large loculated pleural effusion within air-fluid level most consistent with empyema. Some associated right lower lobe and right middle lobe atelectatic changes are seen. No parenchymal nodules  are noted. Upper Abdomen: Visualized upper abdomen shows a masslike density measuring 4.8 cm in the upper pole of the left kidney this is incompletely evaluated on this exam. No other focal abnormality in the upper  abdomen is seen. Musculoskeletal: Bony structures show degenerative change of the thoracic spine. No acute bony abnormality is noted. IMPRESSION: Loculated pleural fluid collection with air-fluid level consistent with an empyema. Tube drainage is recommended. Suggestion of a right upper pole renal mass as described. This is incompletely evaluated on this exam. CT of the abdomen and pelvis with contrast material is recommended for further evaluation. These results will be called to the ordering clinician or representative by the Radiologist Assistant, and communication documented in the PACS or zVision Dashboard. Electronically Signed   By: Alcide Clever M.D.   On: 05/11/2016 17:33   Dg Chest Port 1 View  Result Date: 05/16/2016 CLINICAL DATA:  61 year old male status post right VATS for empyema postoperative day 3. Initial encounter. EXAM: PORTABLE CHEST 1 VIEW COMPARISON:  05/15/2016 and earlier. FINDINGS: Portable AP semi upright view at 0621 hours. 2 right chest tubes are stable. Small volume of subcutaneous gas along the right chest wall is mildly increased. Stable right IJ central line. Mildly improved lung volumes. Normal cardiac size and mediastinal contours. Decreasing streaky right mid and lower lung opacity. Minimal left lung base atelectasis is stable. IMPRESSION: 1.  Stable lines and tubes.  No pneumothorax. 2. Decreasing streaky right lung opacity. Stable mild left lung base atelectasis. Electronically Signed   By: Odessa Fleming M.D.   On: 05/16/2016 07:41   Dg Chest Port 1 View  Result Date: 05/15/2016 CLINICAL DATA:  61 year old male status post VATS for right empyema postoperative day 2. Initial encounter. EXAM: PORTABLE CHEST 1 VIEW COMPARISON:  05/14/2016 and earlier. FINDINGS: Seated AP  portable view at 0521 hours. Stable right IJ central line. Two right chest tubes remain in place. No pneumothorax identified. Stable lung volumes. Decreasing Patchy and confluent opacity in the right lung. Stable left lung base streaky opacity which most resembles atelectasis. No areas of worsening ventilation. No superimposed pulmonary edema. Stable cardiac size and mediastinal contours. IMPRESSION: 1. Stable lines and tubes. Two right chest tubes in place, no pneumothorax identified. 2. Improving right lung ventilation. Stable mild left lung base atelectasis. Electronically Signed   By: Odessa Fleming M.D.   On: 05/15/2016 07:12   Dg Chest Port 1 View  Result Date: 05/14/2016 CLINICAL DATA:  Empyema, chest 2 EXAM: PORTABLE CHEST 1 VIEW COMPARISON:  Chest radiograph from one day prior. FINDINGS: Superior most right chest tube terminates over the mid to upper right pleural space. Basilar right chest tube is in place. Right internal jugular central venous catheter terminates in the upper third of the superior vena cava. Stable cardiomediastinal silhouette with normal heart size. No pneumothorax. Stable mild pleural thickening in the peripheral right pleural space. No left pleural effusion. No overt pulmonary edema. Stable low lung volumes. Stable patchy opacity in the mid to lower right lung. Mild left basilar atelectasis. Subcutaneous emphysema in the lateral lower right chest wall. IMPRESSION: 1. No pneumothorax. Right chest tubes in place. Stable peripheral right pleural thickening. 2. Stable low lung volumes. Stable patchy mid to lower right lung opacity and mild left basilar atelectasis. Electronically Signed   By: Delbert Phenix M.D.   On: 05/14/2016 07:36   Dg Chest Port 1 View  Result Date: 05/13/2016 CLINICAL DATA:  LEFT lung surgery.  Empyema EXAM: PORTABLE CHEST 1 VIEW COMPARISON:  Radiograph 05/11/2016 FINDINGS: Normal cardiac silhouette. Interval placement of 2 RIGHT chest tubes, 1 directed superiorly and 1  directed inferiorly. There is marked reduction in volume of the fluid collection at the  RIGHT lung base with only small residual fluid and atelectasis evident by a radiograph. No visible pneumothorax. LEFT lung clear. RIGHT central venous line. IMPRESSION: Marked reduction in volume of fluid within the cavitary lesion in the RIGHT lower lobe following two chest tube placement. Electronically Signed   By: Genevive Bi M.D.   On: 05/13/2016 10:46    Time Spent in minutes  30   Pearson Grippe M.D on 05/18/2016 at 7:59 PM  Between 7am to 7am - Pager - [phone]

## 2016-05-18 NOTE — Progress Notes (Addendum)
      [address]       [place] [id number]             [phone]      5 Days Post-Op Procedure(s) (LRB): VIDEO ASSISTED THORACOSCOPY (VATS)/DRAIN EMPYEMA (Right) Subjective: No issues with the chest tube removal yesterday.  Objective: Vital signs in last 24 hours: Temp:  [97.6 F (36.4 C)-99.1 F (37.3 C)] 97.6 F (36.4 C) (02/06 0520) Pulse Rate:  [73-89] 89 (02/06 0520) Cardiac Rhythm: Normal sinus rhythm (02/05 1930) Resp:  [17-20] 18 (02/06 0520) BP: (128-147)/(58-73) 147/73 (02/06 0520) SpO2:  [94 %-96 %] 96 % (02/06 0520)     Intake/Output from previous day: 02/05 0701 - 02/06 0700 In: 1945 [P.O.:600; I.V.:45; IV Piggyback:1300] Out: 3080 [Urine:3000; Chest Tube:80] Intake/Output this shift: No intake/output data recorded.  General appearance: alert, cooperative and no distress Heart: regular rate and rhythm, S1, S2 normal, no murmur, click, rub or gallop Lungs: clear to auscultation bilaterally Abdomen: soft, non-tender; bowel sounds normal; no masses,  no organomegaly Extremities: extremities normal, atraumatic, no cyanosis or edema Wound: clean and dry  Lab Results:  Recent Labs  05/16/16 0521 05/17/16 0500  WBC 10.4 13.9*  HGB 8.7* 9.7*  HCT 28.1* 30.7*  PLT 489* 575*   BMET:  Recent Labs  05/16/16 0521 05/17/16 0500  NA 140 137  K 4.2 4.2  CL 105 105  CO2 25 26  GLUCOSE 108* 130*  BUN 16 17  CREATININE 1.06 1.08  CALCIUM 8.0* 8.2*    PT/INR: No results for input(s): LABPROT, INR in the last 72 hours. ABG    Component Value Date/Time   PHART 7.399 05/14/2016 0413   HCO3 25.2 05/14/2016 0413   TCO2 26 05/14/2016 0413   O2SAT 97.0 05/14/2016 0413   CBG (last 3)   Recent Labs  05/15/16 0835  GLUCAP 138*    Assessment/Plan: S/P Procedure(s) (LRB): VIDEO ASSISTED THORACOSCOPY (VATS)/DRAIN EMPYEMA (Right)  1. CV - NSR. On Coreg 3.125 mg bid. BP okay 2.  Pulmonary - Chest tube removed yesterday. CXR this am  appears stable (no pneumothorax, small right sided chest wall subcutaneous emphysema, and small right pleural/atelectasis).  Encourage incentive spirometer. 3. ID-on Vancomycin,Clindamycin, and Levaquin. Cultures thus far show gram positive cocci and gram negative rods. 4. Anemia-H and H stable  Plan: Work on mobilization. Stool softener for bowel movement. Continue antibiotics. Discontinue PCA.     LOS: 7 days    John [name] 05/18/2016   Chart reviewed, patient examined, agree with above. He is doing well and CXR looks good this am. Awaiting final culture results to know what antibiotic to put him on at discharge. Then he will be able to go home.

## 2016-05-19 LAB — CBC
HCT: 29 % — ABNORMAL LOW (ref 39.0–52.0)
Hemoglobin: 9 g/dL — ABNORMAL LOW (ref 13.0–17.0)
MCH: 27.9 pg (ref 26.0–34.0)
MCHC: 31 g/dL (ref 30.0–36.0)
MCV: 89.8 fL (ref 78.0–100.0)
Platelets: 328 K/uL (ref 150–400)
RBC: 3.23 MIL/uL — ABNORMAL LOW (ref 4.22–5.81)
RDW: 15.3 % (ref 11.5–15.5)
WBC: 10.4 K/uL (ref 4.0–10.5)

## 2016-05-19 MED ORDER — CLINDAMYCIN HCL 300 MG PO CAPS: 300.0000 mg | ORAL_CAPSULE | Freq: Three times a day (TID) | ORAL | 0 refills | Status: DC

## 2016-05-19 MED ORDER — OXYCODONE HCL 5 MG PO TABS: 5.0000 mg | ORAL_TABLET | Freq: Four times a day (QID) | ORAL | 0 refills | Status: DC | PRN

## 2016-05-19 NOTE — Care Management Note (Signed)
Case Management Note Donn PieriniKristi Marlia Schewe RN, BSN Unit 2W-Case Manager [phone]  Patient Details  Name: John CrumbleRichard T [name] MRN: [id number] Date of Birth: 03/28/1956  Subjective/Objective:  Pt s/p VATS                 Action/Plan: PTA pt lived at home with wife- independent- plan to return home with wife- no CM needs noted.   Expected Discharge Date:  05/19/16               Expected Discharge Plan:  Home/Self Care  In-House Referral:     Discharge planning Services  CM Consult  Post Acute Care Choice:  NA Choice offered to:  NA  DME Arranged:    DME Agency:     HH Arranged:    HH Agency:     Status of Service:  Completed, signed off  If discussed at Long Length of Stay Meetings, dates discussed:    Discharge Disposition: home/self care   Additional Comments:  Darrold SpanWebster, Harshan Kearley Hall, RN 05/19/2016, 2:32 PM

## 2016-05-19 NOTE — Progress Notes (Signed)
Patient ID: John [name], male   DOB: [birth year], 61 y.o.   MRN: [id number]                                                                PROGRESS NOTE                                                                                                                                                                                                             Patient Demographics:    John [name], is a 61 y.o. male, DOB - March 14, 1956, XBJ:[id number]  Admit date - 05/11/2016   Admitting Physician Pearson Grippe, MD  Outpatient Primary MD for the patient is Pearson Grippe, MD  LOS - 8  Outpatient Specialists:  No chief complaint on file.      Brief Narrative  60 y.o.male,w psoriasis c/o cough since December 2017, Pt was seen in ER and tx with omnicef and zithromax. Pt then continued to have cough and seen in office, and tx with. Pt presented today due to worsening dyspnea and was found to have right sided pleural effusion on CXR. Pt sent to hospital for direct admission. Pt has not taken methotrexate for the past 2 weeks due to feeling poorly.   On Arrival to [facility] had CT chest which shows a loculated pleural effusion. Pt has been afebrile, CT scan also showed possible right renal mass incompletely visualized on CT chest. Pt will be admitted for dyspnea from pleural effusion ? Empyema. Ultrasound guided thoracentesis has been ordered.  Discussed case with pulmonary who recommended IV abx. Pt seen by CT surgery who recommended    Subjective:    John [name] today feeling well,  Afebrile,  Breathing ok.  Denies cp, palp.  Burning sensation near incision is still present since surgery.    Assessment  & Plan :    Active Problems:   Dyspnea   Pleural effusion   Psoriatic arthritis (HCC)   Empyema (HCC)   1. R pleural effusion/empyema s/p VATS/ decortication 2/1 Awaiting cultures, (gram stain gram+ cocci , gram negative rods) => peptostreptococcus No sensitivities available D/c  levaquin  Repeat cbc due to mild leukocytosis Cont iv vanco, clindamycin Defer to CT surgery regarding abx choice Probably can convert to po clindamycin   2. R renal lesion CT  scan abd/ pelvis=> negative  3. Psoriasis Methotrexate on hold  4. Hypertension Cont current bp medications.   5. Abnormal liver function improving  6. Anemia stable Code Status:FULL CODE  Family Communication :w patient and wife  Disposition Plan:home.   Barriers For Discharge:  Consults :pulmonary, CT surgery  Procedures : R VATS/decortication 05/13/2016  DVT Prophylaxis: -SCDs,lovenox Antibiotics : vanco 1/30=> levaquin 1/30=>2/6 Meropenem 1/30=>2/2 Clindamycin 2/2=>      Lab Results  Component Value Date   PLT 575 (H) 05/17/2016      Anti-infectives    Start     Dose/Rate Route Frequency Ordered Stop   05/15/16 2200  clindamycin (CLEOCIN) IVPB 600 mg     600 mg 100 mL/hr over 30 Minutes Intravenous Every 8 hours 05/15/16 2009     05/14/16 2200  clindamycin (CLEOCIN) IVPB 600 mg  Status:  Discontinued     600 mg 100 mL/hr over 30 Minutes Intravenous Every 8 hours 05/14/16 1941 05/15/16 1945   05/14/16 2000  levofloxacin (LEVAQUIN) IVPB 750 mg  Status:  Discontinued     750 mg 100 mL/hr over 90 Minutes Intravenous Every 24 hours 05/14/16 1514 05/19/16 0655   05/13/16 1900  levofloxacin (LEVAQUIN) IVPB 750 mg  Status:  Discontinued     750 mg 100 mL/hr over 90 Minutes Intravenous Every 48 hours 05/13/16 1527 05/14/16 1514   05/13/16 1630  vancomycin (VANCOCIN) IVPB 1000 mg/200 mL premix     1,000 mg 200 mL/hr over 60 Minutes Intravenous Every 12 hours 05/13/16 1527     05/12/16 0800  vancomycin (VANCOCIN) IVPB 750 mg/150 ml premix  Status:  Discontinued     750 mg 150 mL/hr over 60 Minutes Intravenous Every 12 hours 05/11/16 1838 05/13/16 1433   05/11/16 2000  meropenem (MERREM) 1 g in sodium chloride 0.9 % 100 mL IVPB  Status:   Discontinued     1 g 200 mL/hr over 30 Minutes Intravenous Every 8 hours 05/11/16 1837 05/13/16 1433   05/11/16 1900  vancomycin (VANCOCIN) 2,500 mg in sodium chloride 0.9 % 500 mL IVPB     2,500 mg 250 mL/hr over 120 Minutes Intravenous STAT 05/11/16 1836 05/11/16 2233   05/11/16 1900  levofloxacin (LEVAQUIN) IVPB 750 mg  Status:  Discontinued     750 mg 100 mL/hr over 90 Minutes Intravenous Every 24 hours 05/11/16 1836 05/13/16 1433        Objective:   Vitals:   05/18/16 0520 05/18/16 1344 05/18/16 2034 05/19/16 0454  BP: (!) 147/73 (!) 124/56 126/65 133/86  Pulse: 89 79 76 85  Resp: 18 18 18 18   Temp: 97.6 F (36.4 C) 98.6 F (37 C) 98.3 F (36.8 C) 98.7 F (37.1 C)  TempSrc: Oral Oral Oral Oral  SpO2: 96% 95% 95% 94%  Weight:      Height:        Wt Readings from Last 3 Encounters:  05/14/16 110.6 kg (243 lb 13.3 oz)  06/19/14 117.9 kg (260 lb)  02/19/12 117 kg (258 lb)     Intake/Output Summary (Last 24 hours) at 05/19/16 0701 Last data filed at 05/19/16 0455  Gross per 24 hour  Intake              360 ml  Output             1926 ml  Net            -1566 ml     Physical Exam  Awake  Alert, Oriented X 3, No new F.N deficits, Normal affect Crystal.AT,PERRAL Supple Neck,No JVD, No cervical lymphadenopathy appriciated.  Symmetrical Chest wall movement, Good air movement bilaterally, CTAB RRR,No Gallops,Rubs or new Murmurs, No Parasternal Heave +ve B.Sounds, Abd Soft, No tenderness, No organomegaly appriciated, No rebound - guarding or rigidity. No Cyanosis, Clubbing or edema, No new Rash or bruise       Data Review:    CBC  Recent Labs Lab 05/13/16 1025 05/14/16 0422 05/15/16 0435 05/16/16 0521 05/17/16 0500  WBC 22.1* 13.2* 9.8 10.4 13.9*  HGB 8.9* 8.3* 8.1* 8.7* 9.7*  HCT 29.3* 27.2* 26.1* 28.1* 30.7*  PLT 539* 397 388 489* 575*  MCV 91.3 91.3 91.3 90.4 90.0  MCH 27.7 27.9 28.3 28.0 28.4  MCHC 30.4 30.5 31.0 31.0 31.6  RDW 14.6 14.7 14.7 15.0  15.0    Chemistries   Recent Labs Lab 05/14/16 0422 05/15/16 0435 05/16/16 0521 05/17/16 0500  NA 136 138 140 137  K 4.1 4.2 4.2 4.2  CL 105 109 105 105  CO2 23 25 25 26   GLUCOSE 152* 120* 108* 130*  BUN 18 19 16 17   CREATININE 0.88 1.01 1.06 1.08  CALCIUM 7.7* 7.8* 8.0* 8.2*  AST 64* 55*  --   --   ALT 85* 74*  --   --   ALKPHOS 106 98  --   --   BILITOT 0.7 0.3  --   --    ------------------------------------------------------------------------------------------------------------------ No results for input(s): CHOL, HDL, LDLCALC, TRIG, CHOLHDL, LDLDIRECT in the last 72 hours.  No results found for: HGBA1C ------------------------------------------------------------------------------------------------------------------ No results for input(s): TSH, T4TOTAL, T3FREE, THYROIDAB in the last 72 hours.  Invalid input(s): FREET3 ------------------------------------------------------------------------------------------------------------------ No results for input(s): VITAMINB12, FOLATE, FERRITIN, TIBC, IRON, RETICCTPCT in the last 72 hours.  Coagulation profile No results for input(s): INR, PROTIME in the last 168 hours.  No results for input(s): DDIMER in the last 72 hours.  Cardiac Enzymes No results for input(s): CKMB, TROPONINI, MYOGLOBIN in the last 168 hours.  Invalid input(s): CK ------------------------------------------------------------------------------------------------------------------ No results found for: BNP  Inpatient Medications  Scheduled Meds: . bisacodyl  10 mg Oral Daily  . buPROPion  300 mg Oral Daily  . carvedilol  3.125 mg Oral BID WC  . clindamycin (CLEOCIN) IV  600 mg Intravenous Q8H  . enoxaparin (LOVENOX) injection  40 mg Subcutaneous Daily  . FLUoxetine  60 mg Oral Daily  . saccharomyces boulardii  250 mg Oral BID  . senna-docusate  1 tablet Oral QHS  . vancomycin  1,000 mg Intravenous Q12H   Continuous Infusions: PRN  Meds:.levalbuterol, ondansetron (ZOFRAN) IV, oxyCODONE, potassium chloride (KCL MULTIRUN) 30 mEq in 265 mL IVPB, sodium chloride flush  Micro Results Recent Results (from the past 240 hour(s))  Culture, blood (routine x 2)     Status: None   Collection Time: 05/11/16  3:47 PM  Result Value Ref Range Status   Specimen Description BLOOD RIGHT ARM  Final   Special Requests BOTTLES DRAWN AEROBIC AND ANAEROBIC 10CC  Final   Culture   Final    NO GROWTH 5 DAYS Performed at [facility], [address] [place], [place] [address]    Report Status 05/16/2016 FINAL  Final  Culture, blood (routine x 2)     Status: None   Collection Time: 05/11/16  3:47 PM  Result Value Ref Range Status   Specimen Description BLOOD LEFT ARM  Final   Special Requests BOTTLES DRAWN AEROBIC ONLY 5CC  Final   Culture   Final    NO GROWTH 5 DAYS Performed at [facility], [address], [place], [place] [address]    Report Status 05/16/2016 FINAL  Final  Fungus Culture With Stain     Status: None (Preliminary result)   Collection Time: 05/13/16  8:42 AM  Result Value Ref Range Status   Fungus Stain Final report  Final    Comment: (NOTE) Performed At: [facility] [address] [place], [place] [id number] Mila Homer MD [id number]    Fungus (Mycology) Culture PENDING  Incomplete   Fungal Source PLEURAL  Final    Comment: RIGHT  Acid Fast Smear (AFB)     Status: None   Collection Time: 05/13/16  8:42 AM  Result Value Ref Range Status   AFB Specimen Processing Concentration  Final   Acid Fast Smear Negative  Final    Comment: (NOTE) Performed At: [facility] [address] [place], [place] [id number] Mila Homer MD [id number]    Source (AFB) PLEURAL  Final    Comment: RIGHT  Culture, body fluid-bottle     Status: Abnormal   Collection Time: 05/13/16  8:42 AM  Result Value Ref Range Status   Specimen Description PLEURAL RIGHT  Final   Special Requests  NONE  Final   Gram Stain   Final    GRAM POSITIVE COCCI IN PAIRS GRAM NEGATIVE RODS IN BOTH AEROBIC AND ANAEROBIC BOTTLES    Culture PEPTOSTREPTOCOCCUS MICROS (A)  Final   Report Status 05/18/2016 FINAL  Final  Gram stain     Status: None   Collection Time: 05/13/16  8:42 AM  Result Value Ref Range Status   Specimen Description PLEURAL RIGHT  Final   Special Requests NONE  Final   Gram Stain   Final    FEW WBC PRESENT, PREDOMINANTLY PMN ABUNDANT GRAM NEGATIVE RODS ABUNDANT GRAM POSITIVE COCCI IN PAIRS ABUNDANT GRAM VARIABLE ROD    Report Status 05/13/2016 FINAL  Final  Fungus Culture Result     Status: None   Collection Time: 05/13/16  8:42 AM  Result Value Ref Range Status   Result 1 Comment  Final    Comment: (NOTE) KOH/Calcofluor preparation:  no fungus observed. Performed At: [facility] [address] [place], [place] [id number] Mila Homer MD [id number]   MRSA PCR Screening     Status: None   Collection Time: 05/13/16  2:54 PM  Result Value Ref Range Status   MRSA by PCR NEGATIVE NEGATIVE Final    Comment:        The GeneXpert MRSA Assay (FDA approved for NASAL specimens only), is one component of a comprehensive MRSA colonization surveillance program. It is not intended to diagnose MRSA infection nor to guide or monitor treatment for MRSA infections.     Radiology Reports Ct Abdomen Pelvis W Wo Contrast  Result Date: 05/17/2016 CLINICAL DATA:  Renal mass identified on chest CT. EXAM: CT ABDOMEN AND PELVIS WITHOUT AND WITH CONTRAST TECHNIQUE: Multidetector CT imaging of the abdomen and pelvis was performed following the standard protocol before and following the bolus administration of intravenous contrast. CONTRAST:  ISOVUE-300 IOPAMIDOL (ISOVUE-300) INJECTION 61% COMPARISON:  Chest CT 05/11/2016. No prior dedicated abdominal imaging. FINDINGS: Lower chest: Right base atelectasis. Right chest tube in place with trace right pleural fluid  and air. Normal heart size. Hepatobiliary: Mild hepatic steatosis, without focal liver lesion. Hepatomegaly at 21.0 cm craniocaudal. Normal gallbladder, without biliary ductal  dilatation. Pancreas: Normal, without mass or ductal dilatation. Spleen: Normal in size, without focal abnormality. Adrenals/Urinary Tract: Normal adrenal glands. No renal calculi or hydronephrosis. No evidence of renal mass, including in the area of concern in the medial upper pole right kidney. Normal urinary bladder. Stomach/Bowel: Normal stomach, without wall thickening. Scattered colonic diverticula. Normal terminal ileum and appendix. Normal small bowel. Vascular/Lymphatic: Atherosclerotic irregularity at the origin of the SMA on image 54/series 6. There is also aortic atherosclerosis with non aneurysmal dilatation of the infrarenal segment including at 2.7 cm. No abdominopelvic adenopathy. Reproductive: Normal prostate. Other: No significant free fluid. Bilateral small fat containing inguinal hernias. A periumbilical fat containing hernia is tiny. Musculoskeletal: Bilateral L5 pars defects. Trace L5-S1 anterolisthesis with moderate lumbosacral spondylosis. IMPRESSION: 1. No evidence of renal mass. 2. Hepatic steatosis and hepatomegaly. 3. Aortic atherosclerosis with mild irregularity involving the superior mesenteric artery at its origin. 4. Right chest tube in place with trace pleural fluid and air. Electronically Signed   By: Jeronimo Greaves M.D.   On: 05/17/2016 08:00   Dg Chest 2 View  Result Date: 05/18/2016 CLINICAL DATA:  Empyema.  History of right lung surgery EXAM: CHEST  2 VIEW COMPARISON:  Yesterday FINDINGS: Right chest tube has been removed. No pneumothorax or re- accumulated pleural fluid. Right IJ central line is likely clamped at the neck; position is stable. Unchanged right lung opacity from atelectasis. Left chest is clear. Normal heart size. IMPRESSION: Right chest tube removal. No pneumothorax or increasing pleural  fluid. Electronically Signed   By: Marnee Spring M.D.   On: 05/18/2016 07:42   Dg Chest 2 View  Result Date: 05/17/2016 CLINICAL DATA:  Status post empyema drainage via thoracoscopy 4 days ago. EXAM: CHEST  2 VIEW COMPARISON:  Portable chest x-ray of May 16, 2016 FINDINGS: The lungs are slightly better inflated today. There is decreased interstitial density in the right lung. The lower right chest tube is been removed. The upper tube persists with its tip projecting over the posterior aspect of the right 6 rib. There is no pneumothorax nor large pleural effusion. The left lung is clear. The heart and pulmonary vascularity are normal. The right internal jugular venous catheter tip projects over the proximal SVC. IMPRESSION: Decreased interstitial density on the right. Small remaining right pleural effusion. No pneumothorax. The remaining chest tube is in reasonable position. Electronically Signed   By: David  [place] M.D.   On: 05/17/2016 07:40   Ct Chest W Contrast  Result Date: 05/11/2016 CLINICAL DATA:  Extensive right pleural effusion EXAM: CT CHEST WITH CONTRAST TECHNIQUE: Multidetector CT imaging of the chest was performed during intravenous contrast administration. CONTRAST:  75mL ISOVUE-300 IOPAMIDOL (ISOVUE-300) INJECTION 61% COMPARISON:  Chest x-ray from earlier in the same day, 04/26/2016 FINDINGS: Cardiovascular: Thoracic aorta demonstrates some calcific changes without aneurysmal dilatation or dissection. Pulmonary artery shows no large central filling defects although timing for emboli it was not performed. Cardiac structures are within normal limits. Mediastinum/Nodes: The thoracic inlet shows no acute abnormality. Scattered small lymph nodes are noted throughout the mediastinum likely of reactive nature. No sizable lymph nodes are seen. Lungs/Pleura: The left lung is within normal limits. The right hemithorax demonstrates evidence of a large loculated pleural effusion within air-fluid  level most consistent with empyema. Some associated right lower lobe and right middle lobe atelectatic changes are seen. No parenchymal nodules are noted. Upper Abdomen: Visualized upper abdomen shows a masslike density measuring 4.8 cm in the upper pole of the  left kidney this is incompletely evaluated on this exam. No other focal abnormality in the upper abdomen is seen. Musculoskeletal: Bony structures show degenerative change of the thoracic spine. No acute bony abnormality is noted. IMPRESSION: Loculated pleural fluid collection with air-fluid level consistent with an empyema. Tube drainage is recommended. Suggestion of a right upper pole renal mass as described. This is incompletely evaluated on this exam. CT of the abdomen and pelvis with contrast material is recommended for further evaluation. These results will be called to the ordering clinician or representative by the Radiologist Assistant, and communication documented in the PACS or zVision Dashboard. Electronically Signed   By: Alcide Clever M.D.   On: 05/11/2016 17:33   Dg Chest Port 1 View  Result Date: 05/16/2016 CLINICAL DATA:  61 year old male status post right VATS for empyema postoperative day 3. Initial encounter. EXAM: PORTABLE CHEST 1 VIEW COMPARISON:  05/15/2016 and earlier. FINDINGS: Portable AP semi upright view at 0621 hours. 2 right chest tubes are stable. Small volume of subcutaneous gas along the right chest wall is mildly increased. Stable right IJ central line. Mildly improved lung volumes. Normal cardiac size and mediastinal contours. Decreasing streaky right mid and lower lung opacity. Minimal left lung base atelectasis is stable. IMPRESSION: 1.  Stable lines and tubes.  No pneumothorax. 2. Decreasing streaky right lung opacity. Stable mild left lung base atelectasis. Electronically Signed   By: Odessa Fleming M.D.   On: 05/16/2016 07:41   Dg Chest Port 1 View  Result Date: 05/15/2016 CLINICAL DATA:  61 year old male status post VATS  for right empyema postoperative day 2. Initial encounter. EXAM: PORTABLE CHEST 1 VIEW COMPARISON:  05/14/2016 and earlier. FINDINGS: Seated AP portable view at 0521 hours. Stable right IJ central line. Two right chest tubes remain in place. No pneumothorax identified. Stable lung volumes. Decreasing Patchy and confluent opacity in the right lung. Stable left lung base streaky opacity which most resembles atelectasis. No areas of worsening ventilation. No superimposed pulmonary edema. Stable cardiac size and mediastinal contours. IMPRESSION: 1. Stable lines and tubes. Two right chest tubes in place, no pneumothorax identified. 2. Improving right lung ventilation. Stable mild left lung base atelectasis. Electronically Signed   By: Odessa Fleming M.D.   On: 05/15/2016 07:12   Dg Chest Port 1 View  Result Date: 05/14/2016 CLINICAL DATA:  Empyema, chest 2 EXAM: PORTABLE CHEST 1 VIEW COMPARISON:  Chest radiograph from one day prior. FINDINGS: Superior most right chest tube terminates over the mid to upper right pleural space. Basilar right chest tube is in place. Right internal jugular central venous catheter terminates in the upper third of the superior vena cava. Stable cardiomediastinal silhouette with normal heart size. No pneumothorax. Stable mild pleural thickening in the peripheral right pleural space. No left pleural effusion. No overt pulmonary edema. Stable low lung volumes. Stable patchy opacity in the mid to lower right lung. Mild left basilar atelectasis. Subcutaneous emphysema in the lateral lower right chest wall. IMPRESSION: 1. No pneumothorax. Right chest tubes in place. Stable peripheral right pleural thickening. 2. Stable low lung volumes. Stable patchy mid to lower right lung opacity and mild left basilar atelectasis. Electronically Signed   By: Delbert Phenix M.D.   On: 05/14/2016 07:36   Dg Chest Port 1 View  Result Date: 05/13/2016 CLINICAL DATA:  LEFT lung surgery.  Empyema EXAM: PORTABLE CHEST 1 VIEW  COMPARISON:  Radiograph 05/11/2016 FINDINGS: Normal cardiac silhouette. Interval placement of 2 RIGHT chest tubes, 1 directed superiorly  and 1 directed inferiorly. There is marked reduction in volume of the fluid collection at the RIGHT lung base with only small residual fluid and atelectasis evident by a radiograph. No visible pneumothorax. LEFT lung clear. RIGHT central venous line. IMPRESSION: Marked reduction in volume of fluid within the cavitary lesion in the RIGHT lower lobe following two chest tube placement. Electronically Signed   By: Genevive Bi M.D.   On: 05/13/2016 10:46    Time Spent in minutes  30   Pearson Grippe M.D on 05/19/2016 at 7:01 AM  Between 7am to 7am - Pager - [phone]

## 2016-05-19 NOTE — Progress Notes (Addendum)
[address]       [place] [address]             [phone]      6 Days Post-Op Procedure(s) (LRB): VIDEO ASSISTED THORACOSCOPY (VATS)/DRAIN EMPYEMA (Right) Subjective: conts to feel better Peptostreptococcus is organism , sensitivities pending  Objective: Vital signs in last 24 hours: Temp:  [98.3 F (36.8 C)-98.7 F (37.1 C)] 98.7 F (37.1 C) (02/07 0454) Pulse Rate:  [76-85] 85 (02/07 0454) Cardiac Rhythm: Normal sinus rhythm (02/06 2004) Resp:  [18] 18 (02/07 0454) BP: (124-133)/(56-86) 133/86 (02/07 0454) SpO2:  [94 %-95 %] 94 % (02/07 0454)  Hemodynamic parameters for last 24 hours:    Intake/Output from previous day: 02/06 0701 - 02/07 0700 In: 360 [P.O.:360] Out: 1926 [Urine:1925; Stool:1] Intake/Output this shift: No intake/output data recorded.  General appearance: alert, cooperative and no distress Heart: regular rate and rhythm Lungs: mildly dim in right base Abdomen: benign Extremities: warm Wound: incis healing well  Lab Results:  Recent Labs  05/17/16 0500  WBC 13.9*  HGB 9.7*  HCT 30.7*  PLT 575*   BMET:  Recent Labs  05/17/16 0500  NA 137  K 4.2  CL 105  CO2 26  GLUCOSE 130*  BUN 17  CREATININE 1.08  CALCIUM 8.2*    PT/INR: No results for input(s): LABPROT, INR in the last 72 hours. ABG    Component Value Date/Time   PHART 7.399 05/14/2016 0413   HCO3 25.2 05/14/2016 0413   TCO2 26 05/14/2016 0413   O2SAT 97.0 05/14/2016 0413   CBG (last 3)  No results for input(s): GLUCAP in the last 72 hours.  Meds Scheduled Meds: . bisacodyl  10 mg Oral Daily  . buPROPion  300 mg Oral Daily  . carvedilol  3.125 mg Oral BID WC  . clindamycin (CLEOCIN) IV  600 mg Intravenous Q8H  . enoxaparin (LOVENOX) injection  40 mg Subcutaneous Daily  . FLUoxetine  60 mg Oral Daily  . saccharomyces boulardii  250 mg Oral BID  . senna-docusate  1 tablet Oral QHS  . vancomycin  1,000 mg Intravenous Q12H   Continuous  Infusions: PRN Meds:.levalbuterol, ondansetron (ZOFRAN) IV, oxyCODONE, potassium chloride (KCL MULTIRUN) 30 mEq in 265 mL IVPB, sodium chloride flush  Xrays Dg Chest 2 View  Result Date: 05/18/2016 CLINICAL DATA:  Empyema.  History of right lung surgery EXAM: CHEST  2 VIEW COMPARISON:  Yesterday FINDINGS: Right chest tube has been removed. No pneumothorax or re- accumulated pleural fluid. Right IJ central line is likely clamped at the neck; position is stable. Unchanged right lung opacity from atelectasis. Left chest is clear. Normal heart size. IMPRESSION: Right chest tube removal. No pneumothorax or increasing pleural fluid. Electronically Signed   By: Marnee Spring M.D.   On: 05/18/2016 07:42    Results for orders placed or performed during the hospital encounter of 05/11/16  Culture, blood (routine x 2)     Status: None   Collection Time: 05/11/16  3:47 PM  Result Value Ref Range Status   Specimen Description BLOOD RIGHT ARM  Final   Special Requests BOTTLES DRAWN AEROBIC AND ANAEROBIC 10CC  Final   Culture   Final    NO GROWTH 5 DAYS Performed at [facility], [address], [place], [place] [address]    Report Status 05/16/2016 FINAL  Final  Culture, blood (routine x 2)     Status: None   Collection  Time: 05/11/16  3:47 PM  Result Value Ref Range Status   Specimen Description BLOOD LEFT ARM  Final   Special Requests BOTTLES DRAWN AEROBIC ONLY 5CC  Final   Culture   Final    NO GROWTH 5 DAYS Performed at [facility], [address] [place], [place] [address]    Report Status 05/16/2016 FINAL  Final  Fungus Culture With Stain     Status: None (Preliminary result)   Collection Time: 05/13/16  8:42 AM  Result Value Ref Range Status   Fungus Stain Final report  Final    Comment: (NOTE) Performed At: [facility] [place] [address] [place], [place] [id number] Mila HomerHancock William F MD [id number]    Fungus (Mycology) Culture PENDING  Incomplete   Fungal Source  PLEURAL  Final    Comment: RIGHT  Acid Fast Smear (AFB)     Status: None   Collection Time: 05/13/16  8:42 AM  Result Value Ref Range Status   AFB Specimen Processing Concentration  Final   Acid Fast Smear Negative  Final    Comment: (NOTE) Performed At: [facility] [place] [address] [place], [place] [id number] Mila HomerHancock William F MD [id number]    Source (AFB) PLEURAL  Final    Comment: RIGHT  Culture, body fluid-bottle     Status: Abnormal   Collection Time: 05/13/16  8:42 AM  Result Value Ref Range Status   Specimen Description PLEURAL RIGHT  Final   Special Requests NONE  Final   Gram Stain   Final    GRAM POSITIVE COCCI IN PAIRS GRAM NEGATIVE RODS IN BOTH AEROBIC AND ANAEROBIC BOTTLES    Culture PEPTOSTREPTOCOCCUS MICROS (A)  Final   Report Status 05/18/2016 FINAL  Final  Gram stain     Status: None   Collection Time: 05/13/16  8:42 AM  Result Value Ref Range Status   Specimen Description PLEURAL RIGHT  Final   Special Requests NONE  Final   Gram Stain   Final    FEW WBC PRESENT, PREDOMINANTLY PMN ABUNDANT GRAM NEGATIVE RODS ABUNDANT GRAM POSITIVE COCCI IN PAIRS ABUNDANT GRAM VARIABLE ROD    Report Status 05/13/2016 FINAL  Final  Fungus Culture Result     Status: None   Collection Time: 05/13/16  8:42 AM  Result Value Ref Range Status   Result 1 Comment  Final    Comment: (NOTE) KOH/Calcofluor preparation:  no fungus observed. Performed At: [facility] [place] [address] [place], [place] [id number] Mila HomerHancock William F MD [id number]   MRSA PCR Screening     Status: None   Collection Time: 05/13/16  2:54 PM  Result Value Ref Range Status   MRSA by PCR NEGATIVE NEGATIVE Final    Comment:        The GeneXpert MRSA Assay (FDA approved for NASAL specimens only), is one component of a comprehensive MRSA colonization surveillance program. It is not intended to diagnose MRSA infection nor to guide or monitor treatment for MRSA infections.      Assessment/Plan: S/P Procedure(s) (LRB): VIDEO ASSISTED THORACOSCOPY (VATS)/DRAIN EMPYEMA (Right)  1 steady clinical improvement. Afebrile 2 peptostreptococcus- sens pending 3 poss home soon   LOS: 8 days    GOLD,WAYNE E 05/19/2016   Chart reviewed, patient examined, agree with above. He is doing well and WBC ct back to normal Culture of empyema grew peptostreptococcus. Sensitivities will not be reported for this. Normally I would put him on Augmentin for 3 weeks but he has a PCN  allergy recorded. I talked with him about that and he is not really sure about the allergy, what happened, how long ago it was. He did develop a rash here when given Meropenem. The only other oral antibiotic that I have used for this is Moxifloxicin which has fairly good coverage for peptostreptococcus. That may be the safest antibiotic for him given his PCN allergy. I am not sure if Levofloxacin has the same coverage but pharmacy or ID may know. I think he can go home on oral antibiotic and follow up with me in a week with a CXR. I will remove chest tube sutures in the office.  I investigated this a little further and oral Clindamycin may be the best alternative since he is PCN allergic. The risk of C. Diff is higher but probably no higher than with quinolones.

## 2016-05-19 NOTE — Progress Notes (Signed)
Discharge instructions, RX's and follow up appts explained and provided to patient and wife verbalized understanding. Patient left floor via wheelchair accompanied by staff no c/o pain or shortness of breath at d/c.  Makya Phillis Lynn, RN  

## 2016-05-23 ENCOUNTER — Telehealth: Payer: Self-pay | Admitting: Physician Assistant

## 2016-05-23 MED ORDER — DOXYCYCLINE HYCLATE 100 MG PO TABS: 100.0000 mg | ORAL_TABLET | Freq: Two times a day (BID) | ORAL | 0 refills | Status: DC

## 2016-05-23 NOTE — Telephone Encounter (Signed)
Patients wife contact office for her husband.  He was recently treated with VATS procedure with drainage of Empyema.  Culture results reported Peptostreptococcus Micro.  He was discharged home on Clindamycin.  However, he has developed a puritic raised red rash.  This rash is similar to rash he developed using PCN in the past.    Being this rash is concerning for allergy to Clindamycin and the unfamiliarity I have to the organism grown.  [facility] who recommended we switch the patients antibiotic to Doxycycline to 100 mg BID.  This prescription has been called into the pharmacy.

## 2016-05-25 ENCOUNTER — Other Ambulatory Visit: Payer: Self-pay | Admitting: Surgery

## 2016-05-25 DIAGNOSIS — J869 Pyothorax without fistula: Secondary | ICD-10-CM

## 2016-05-26 ENCOUNTER — Ambulatory Visit
Admission: RE | Admit: 2016-05-26 | Discharge: 2016-05-26 | Disposition: A | Discharge status: Home / Self Care | Payer: Managed Care, Other (non HMO) | Source: Ambulatory Visit | Attending: Surgery | Admitting: Surgery

## 2016-05-26 ENCOUNTER — Ambulatory Visit (INDEPENDENT_AMBULATORY_CARE_PROVIDER_SITE_OTHER): Payer: Self-pay | Admitting: Surgery

## 2016-05-26 ENCOUNTER — Encounter: Payer: Self-pay | Admitting: Surgery

## 2016-05-26 VITALS — BP 120/79 | HR 88 | Resp 20 | Ht 71.0 in | Wt 243.0 lb

## 2016-05-26 DIAGNOSIS — R21 Rash and other nonspecific skin eruption: Secondary | ICD-10-CM

## 2016-05-26 DIAGNOSIS — J869 Pyothorax without fistula: Secondary | ICD-10-CM

## 2016-05-26 DIAGNOSIS — Z09 Encounter for follow-up examination after completed treatment for conditions other than malignant neoplasm: Secondary | ICD-10-CM

## 2016-05-30 ENCOUNTER — Encounter: Payer: Self-pay | Admitting: Surgery

## 2016-05-30 NOTE — Progress Notes (Signed)
HPI: Patient returns for routine postoperative follow-up having undergone right VATS with drainage of an empyema and decortication of the right lung on 05/13/2016. The patient's early postoperative recovery while in the hospital was notable for an uncomplicated postop course. The cultures from the empyema grew peptostreptococcus and he was sent home on Clindamycin since he was PCN allergic. He developed a rash in the hospital on Meropenem. After a few doses of Clindamycin at home he developed a red rash over his back and called the office. He was taken off Clindamycin and switched to Doxycycline 100 mg bid. He says that the rash may be getting a little better since switching the antibiotic but still itches a lot. He has no cough or sputum, no fever or chills and no SOB.    Current Outpatient Prescriptions  Medication Sig Dispense Refill  . albuterol (PROVENTIL HFA;VENTOLIN HFA) 108 (90 Base) MCG/ACT inhaler Inhale 2 puffs into the lungs every 4 (four) hours as needed for wheezing or shortness of breath. 1 Inhaler 0  . atomoxetine (STRATTERA) 100 MG capsule Take 100 mg by mouth daily.    . benzonatate (TESSALON) 100 MG capsule Take 100 mg by mouth 3 (three) times daily as needed for cough.    [name] Kitchen buPROPion (WELLBUTRIN XL) 300 MG 24 hr tablet Take 300 mg by mouth daily.    . carvedilol (COREG) 3.125 MG tablet Take 3.125 mg by mouth 2 (two) times daily with a meal.    . doxycycline (VIBRA-TABS) 100 MG tablet Take 1 tablet (100 mg total) by mouth 2 (two) times daily. 56 tablet 0  . fish oil-omega-3 fatty acids 1000 MG capsule Take 2 g by mouth daily.    [name] Kitchen FLUoxetine (PROZAC) 20 MG tablet Take 60 mg by mouth daily.    [name] Kitchen glucosamine-chondroitin 500-400 MG tablet Take 1 tablet by mouth 3 (three) times daily.    [name] Kitchen l-methylfolate-B6-B12 (METANX) 3-35-2 MG TABS Take 1 tablet by mouth daily.    [name] Kitchen losartan (COZAAR) 25 MG tablet Take 25 mg by mouth daily.    . Multiple Vitamins-Minerals (MULTIVITAMIN WITH  MINERALS) tablet Take 1 tablet by mouth daily.    [name] Kitchen oxyCODONE (OXY IR/ROXICODONE) 5 MG immediate release tablet Take 1 tablet (5 mg total) by mouth every 6 (six) hours as needed for severe pain. 30 tablet 0   No current facility-administered medications for this visit.     Physical Exam: BP 120/79   Pulse 88   Resp 20   Ht  (1.803 m)   Wt 243 lb (110.2 kg)   BMI 33.89 kg/m  He looks well Lung exam shows decreased breath sounds in the right base. The incisions look ok. There is a slightly raised red rash over the entire back.   Diagnostic Tests:  CLINICAL DATA:  Empyema postop 1 week  EXAM: CHEST  2 VIEW  COMPARISON:  05/18/2016  FINDINGS: Cardiomediastinal silhouette is stable. Persistent streaky scarring right perihilar and right lower lobe without significant change from prior exam. No definite superimposed infiltrate or pulmonary edema. Tiny amount of fluid along the right minor fissure. Left lung is clear.  IMPRESSION: Persistent asymmetric streaky scarring right perihilar and right lower lobe without superimposed infiltrate or pulmonary edema. Tiny amount of fluid along the right minor fissure. Left lung is clear.   Electronically Signed   By: Natasha Mead M.D.   On: 05/26/2016 10:57   Impression:  He is doing well clinically and the CXR looks good.  I am concerned about the rash on his back and he could be allergic to Doxycycline too. I told him that if it does not improve over the next couple days to let us know so that the Doxycycline can be stopped. I told him to use some 1% hydrocortisone cream for symptomatic relief of the itching. He can return to normal activity as tolerated.  Plan:  I will see him back in the office in one month with a CXR.   Alleen Borne, MD [facility] [phone]

## 2016-05-31 ENCOUNTER — Telehealth: Payer: Self-pay

## 2016-05-31 NOTE — Telephone Encounter (Signed)
Mr John [name] called C/O rash on back and chest still hanging around. Mr John [name] was instructed to stop ABX on Friday afternoon by this office. His rash has improved some, But is still not completely gone. He states that he has not tried any benadryl or hydrocortisone cream. Mr John [name] will start on both today and if still not improving in 24hours, he was instructed to see his PCP for eval.

## 2016-06-11 ENCOUNTER — Other Ambulatory Visit: Payer: Self-pay | Admitting: Surgery

## 2016-06-11 DIAGNOSIS — J869 Pyothorax without fistula: Secondary | ICD-10-CM

## 2016-06-11 LAB — FUNGUS CULTURE WITH STAIN

## 2016-06-11 LAB — FUNGAL ORGANISM REFLEX

## 2016-06-11 LAB — FUNGUS CULTURE RESULT

## 2016-06-16 ENCOUNTER — Encounter: Payer: Self-pay | Admitting: Surgery

## 2016-06-16 ENCOUNTER — Ambulatory Visit
Admission: RE | Admit: 2016-06-16 | Discharge: 2016-06-16 | Disposition: A | Discharge status: Home / Self Care | Payer: Managed Care, Other (non HMO) | Source: Ambulatory Visit | Attending: Surgery | Admitting: Surgery

## 2016-06-16 ENCOUNTER — Ambulatory Visit (INDEPENDENT_AMBULATORY_CARE_PROVIDER_SITE_OTHER): Payer: Self-pay | Admitting: Surgery

## 2016-06-16 VITALS — BP 155/79 | HR 88 | Resp 20 | Ht 71.0 in | Wt 243.0 lb

## 2016-06-16 DIAGNOSIS — J869 Pyothorax without fistula: Secondary | ICD-10-CM

## 2016-06-16 DIAGNOSIS — R21 Rash and other nonspecific skin eruption: Secondary | ICD-10-CM

## 2016-06-16 NOTE — Progress Notes (Signed)
HPI:  Patient returns for routine postoperative follow-up having undergone right VATS with drainage of an empyema and decortication of the right lung on 05/13/2016. The patient's early postoperative recovery while in the hospital was notable for an uncomplicated postop course. The cultures from the empyema grew peptostreptococcus and he was sent home on Clindamycin since he was PCN allergic. He developed a rash in the hospital on Meropenem. After a few doses of Clindamycin at home he developed a red rash over his back and called the office. He was taken off Clindamycin and switched to Doxycycline 100 mg bid. The rash seems like it was a little better but did not resolve so we discontinued the antibiotics and he says that the rash quickly resolved. He is feeling well and back at work. He denies fever, chills, cough, sputum production and back pain. He has mild discomfort anterior to the incisions. He does note a little shortness of breath with significant exertion.  Current Outpatient Prescriptions  Medication Sig Dispense Refill  . albuterol (PROVENTIL HFA;VENTOLIN HFA) 108 (90 Base) MCG/ACT inhaler Inhale 2 puffs into the lungs every 4 (four) hours as needed for wheezing or shortness of breath. 1 Inhaler 0  . atomoxetine (STRATTERA) 100 MG capsule Take 100 mg by mouth daily.    . benzonatate (TESSALON) 100 MG capsule Take 100 mg by mouth 3 (three) times daily as needed for cough.    [name] Kitchen. buPROPion (WELLBUTRIN XL) 300 MG 24 hr tablet Take 300 mg by mouth daily.    . carvedilol (COREG) 3.125 MG tablet Take 3.125 mg by mouth 2 (two) times daily with a meal.    . fish oil-omega-3 fatty acids 1000 MG capsule Take 2 g by mouth daily.    [name] Kitchen. FLUoxetine (PROZAC) 20 MG tablet Take 60 mg by mouth daily.    [name] Kitchen. glucosamine-chondroitin 500-400 MG tablet Take 1 tablet by mouth 3 (three) times daily.    [name] Kitchen. l-methylfolate-B6-B12 (METANX) 3-35-2 MG TABS Take 1 tablet by mouth daily.    [name] Kitchen. losartan (COZAAR) 25 MG  tablet Take 25 mg by mouth daily.    . Multiple Vitamins-Minerals (MULTIVITAMIN WITH MINERALS) tablet Take 1 tablet by mouth daily.     No current facility-administered medications for this visit.      Physical Exam: BP (!) 155/79   Pulse 88   Resp 20   Ht 5\' 11"  (1.803 m)   Wt 243 lb (110.2 kg)   SpO2 97%   BMI 33.89 kg/m  He looks well Lung exam shows decreased breath sounds in the right base. The incisions look ok. The rash has resolved.  Diagnostic Tests:  CLINICAL DATA:  Empyema  EXAM: CHEST  2 VIEW  COMPARISON:  Chest two-view 05/26/2016, chest CT 05/11/2016  FINDINGS: Moderately large loculated pleural effusion posteriorly on the right has progressed since the prior study. No air-fluid level. Mild right lower lobe airspace disease similar to the prior study  Left lung remains clear.  Negative for heart failure.  IMPRESSION: Moderately large loculated pleural effusion posteriorly on the right has progressed since the prior chest x-ray. This could represent recurrent empyema.   Electronically Signed   By: Marlan Palauharles  Clark M.D.   On: 06/16/2016 10:02   Impression:  He is doing well clinically and feels like he is close to normal. His CXR shows a loculated right posterior pleural fluid collection which looks larger than on his last CXR but is still not that impressive. I think it would  be best to continue following this since he is doing so well. This may just be fluid that has filled the residual pleural space.   Plan:  I will see him back in one month with a CXR. He will contact me immediately if he develops any symptoms similar to when he presented before.   Alleen Borne, MD [facility] [phone]

## 2016-06-25 LAB — ACID FAST CULTURE WITH REFLEXED SENSITIVITIES

## 2016-06-25 LAB — ACID FAST CULTURE WITH REFLEXED SENSITIVITIES (MYCOBACTERIA): Acid Fast Culture: NEGATIVE

## 2016-07-27 ENCOUNTER — Other Ambulatory Visit: Payer: Self-pay | Admitting: Surgery

## 2016-07-27 DIAGNOSIS — J869 Pyothorax without fistula: Secondary | ICD-10-CM

## 2016-07-28 ENCOUNTER — Encounter: Payer: 59 | Admitting: Surgery

## 2016-07-28 ENCOUNTER — Ambulatory Visit (INDEPENDENT_AMBULATORY_CARE_PROVIDER_SITE_OTHER): Payer: Self-pay | Admitting: Surgery

## 2016-07-28 ENCOUNTER — Encounter: Payer: Managed Care, Other (non HMO) | Admitting: Surgery

## 2016-07-28 ENCOUNTER — Ambulatory Visit
Admission: RE | Admit: 2016-07-28 | Discharge: 2016-07-28 | Disposition: A | Discharge status: Home / Self Care | Payer: Managed Care, Other (non HMO) | Source: Ambulatory Visit | Attending: Surgery | Admitting: Surgery

## 2016-07-28 ENCOUNTER — Encounter: Payer: Self-pay | Admitting: Surgery

## 2016-07-28 VITALS — BP 126/72 | HR 79 | Resp 20 | Ht 71.0 in | Wt 242.0 lb

## 2016-07-28 DIAGNOSIS — J869 Pyothorax without fistula: Secondary | ICD-10-CM

## 2016-07-28 DIAGNOSIS — Z09 Encounter for follow-up examination after completed treatment for conditions other than malignant neoplasm: Secondary | ICD-10-CM

## 2016-07-28 NOTE — Progress Notes (Signed)
     HPI:  Patient returns for postoperative follow-up having undergone right VATS with drainage of an empyema and decortication of the right lungon 05/13/2016. When I saw him postop in the office on 06/17/2015 he was doing well and back to work but his CXR still showed a posterior loculated right pleural fluid collection. He has continued to do well with no cough or shortness of breath.  Current Outpatient Prescriptions  Medication Sig Dispense Refill  . albuterol (PROVENTIL HFA;VENTOLIN HFA) 108 (90 Base) MCG/ACT inhaler Inhale 2 puffs into the lungs every 4 (four) hours as needed for wheezing or shortness of breath. 1 Inhaler 0  . atomoxetine (STRATTERA) 100 MG capsule Take 100 mg by mouth daily.    [name] Kitchen buPROPion (WELLBUTRIN XL) 300 MG 24 hr tablet Take 300 mg by mouth daily.    . carvedilol (COREG) 3.125 MG tablet Take 3.125 mg by mouth 2 (two) times daily with a meal.    . fish oil-omega-3 fatty acids 1000 MG capsule Take 2 g by mouth daily.    [name] Kitchen FLUoxetine (PROZAC) 20 MG tablet Take 60 mg by mouth daily.    [name] Kitchen glucosamine-chondroitin 500-400 MG tablet Take 1 tablet by mouth 3 (three) times daily.    [name] Kitchen l-methylfolate-B6-B12 (METANX) 3-35-2 MG TABS Take 1 tablet by mouth daily.    [name] Kitchen losartan (COZAAR) 25 MG tablet Take 25 mg by mouth daily.    . Multiple Vitamins-Minerals (MULTIVITAMIN WITH MINERALS) tablet Take 1 tablet by mouth daily.     No current facility-administered medications for this visit.      Physical Exam: BP 126/72   Pulse 79   Resp 20   Ht  (1.803 m)   Wt 242 lb (109.8 kg)   SpO2 95%   BMI 33.75 kg/m  He looks well Lung exam is clear The incisions look ok.  Diagnostic Tests:  CLINICAL DATA:  Status post VATS 05/13/2016. Occasional shortness of breath.  EXAM: CHEST  2 VIEW  COMPARISON:  06/16/2016  FINDINGS: There is no focal parenchymal opacity. There is no pneumothorax. There is a trace right pleural effusion. The heart and mediastinal contours  are unremarkable.  The osseous structures are unremarkable.  IMPRESSION: Trace right pleural effusion. Otherwise no acute cardiopulmonary disease.   Electronically Signed   By: Elige Ko   On: 07/28/2016 14:54   Impression:  He continues to do well. The loculated right pleural fluid collection is almost completely resolved. No further treatment is required. I think he can restart his methotrexate at the end of this month as directed by his rheumatologist.  Plan:  He will continue to follow up with Dr. Selena Batten and Dr. Dierdre Forth.    Alleen Borne, MD [facility] [phone]

## 2016-09-10 NOTE — Addendum Note (Signed)
Addendum  created 09/10/16 1025 by Tyeasha Ebbs, MD   Sign clinical note    

## 2018-04-18 ENCOUNTER — Encounter: Payer: Self-pay | Admitting: Physical Therapy

## 2018-04-18 ENCOUNTER — Ambulatory Visit: Payer: Managed Care, Other (non HMO) | Attending: Family Medicine | Admitting: Physical Therapy

## 2018-04-18 DIAGNOSIS — H8111 Benign paroxysmal vertigo, right ear: Secondary | ICD-10-CM | POA: Diagnosis not present

## 2018-04-18 NOTE — Patient Instructions (Signed)
Self Treatment for Right Posterior / Anterior Canalithiasis    Sitting on bed: 1. Turn head 45 right. (a) Lie back slowly, shoulders on pillow, head on bed. (b) Hold _30___ seconds. 2. Keeping head on bed, turn head 90 left. Hold _30___ seconds. 3. Roll to left, head on 45 angle down toward bed. Hold __30__ seconds. 4. Sit up on left side of bed. Repeat _3___ times per session. Do _2-3___ sessions per day.  Copyright  VHI. All rights reserved.  How to Perform the Epley Maneuver The Epley maneuver is an exercise that relieves symptoms of vertigo. Vertigo is the feeling that you or your surroundings are moving when they are not. When you feel vertigo, you may feel like the room is spinning and have trouble walking. Dizziness is a little different than vertigo. When you are dizzy, you may feel unsteady or light-headed. You can do this maneuver at home whenever you have symptoms of vertigo. You can do it up to 3 times a day until your symptoms go away. Even though the Epley maneuver may relieve your vertigo for a few weeks, it is possible that your symptoms will return. This maneuver relieves vertigo, but it does not relieve dizziness. What are the risks? If it is done correctly, the Epley maneuver is considered safe. Sometimes it can lead to dizziness or nausea that goes away after a short time. If you develop other symptoms, such as changes in vision, weakness, or numbness, stop doing the maneuver and call your health care provider. How to perform the Epley maneuver 1. Sit on the edge of a bed or table with your back straight and your legs extended or hanging over the edge of the bed or table. 2. Turn your head halfway toward the affected ear or side. 3. Lie backward quickly with your head turned until you are lying flat on your back. You may want to position a pillow under your shoulders. 4. Hold this position for 30 seconds. You may experience an attack of vertigo. This is normal. 5. Turn your  head to the opposite direction until your unaffected ear is facing the floor. 6. Hold this position for 30 seconds. You may experience an attack of vertigo. This is normal. Hold this position until the vertigo stops. 7. Turn your whole body to the same side as your head. Hold for another 30 seconds. 8. Sit back up. You can repeat this exercise up to 3 times a day. Follow these instructions at home:  After doing the Epley maneuver, you can return to your normal activities.  Ask your health care provider if there is anything you should do at home to prevent vertigo. He or she may recommend that you: ? Keep your head raised (elevated) with two or more pillows while you sleep. ? Do not sleep on the side of your affected ear. ? Get up slowly from bed. ? Avoid sudden movements during the day. ? Avoid extreme head movement, like looking up or bending over. Contact a health care provider if:  Your vertigo gets worse.  You have other symptoms, including: ? Nausea. ? Vomiting. ? Headache. Get help right away if:  You have vision changes.  You have a severe or worsening headache or neck pain.  You cannot stop vomiting.  You have new numbness or weakness in any part of your body. Summary  Vertigo is the feeling that you or your surroundings are moving when they are not.  The Epley maneuver is an  exercise that relieves symptoms of vertigo.  If the Epley maneuver is done correctly, it is considered safe. You can do it up to 3 times a day. This information is not intended to replace advice given to you by your health care provider. Make sure you discuss any questions you have with your health care provider. Document Released: 04/03/2013 Document Revised: 02/17/2016 Document Reviewed: 02/17/2016 Elsevier Interactive Patient Education  2019 [employer].

## 2018-04-19 ENCOUNTER — Other Ambulatory Visit: Payer: Self-pay

## 2018-04-19 NOTE — Therapy (Signed)
[facility] [address] [place], [place], [address] Phone: [phone]   Fax:  [phone]  Physical Therapy Evaluation  Patient Details  Name: John [name] MRN: [id number] Date of Birth: 13-Jun-1955 Referring Provider (PT): Irena Reichmann, DO   Encounter Date: 04/18/2018  PT End of Session - 04/19/18 2214    Visit Number  1    Number of Visits  1    PT Start Time  1150    PT Stop Time  1236    PT Time Calculation (min)  46 min       Past Medical History:  Diagnosis Date  . Depression   . Hyperlipemia   . Hypertension   . Psoriatic arthritis [facility])     Past Surgical History:  Procedure Laterality Date  . TONSILLECTOMY    . VIDEO ASSISTED THORACOSCOPY (VATS)/EMPYEMA Right 05/13/2016   Procedure: VIDEO ASSISTED THORACOSCOPY (VATS)/DRAIN EMPYEMA;  Surgeon: Alleen Borne, MD;  Location: [facility] OR;  Service: Thoracic;  Laterality: Right;    There were no vitals filed for this visit.   Subjective Assessment - 04/19/18 2206    Subjective  Pt states vertigo started about 6 weeks ago; pt states he felt it last night when he laid down; reports not worse at certain time of day    Pertinent History  h/o BPPV in 2013    Patient Stated Goals  resolve the vertigo    Currently in Pain?  No/denies         [facility] PT Assessment - 04/19/18 0001      Assessment   Medical Diagnosis  Vertigo    Referring Provider (PT)  Irena Reichmann, DO    Onset Date/Surgical Date  --   mid to late Nov. 2019     Precautions   Precautions  None      Balance Screen   Has the patient fallen in the past 6 months  No    Has the patient had a decrease in activity level because of a fear of falling?   No    Is the patient reluctant to leave their home because of a fear of falling?   No           Vestibular Assessment - 04/19/18 0001      Vestibular Assessment   General Observation  pt is a 63 yr old gentleman with c/o vertigo that  started about 6 weeks ago but he says it has not cleared up as it has in the past       Symptom Behavior   Type of Dizziness  Spinning    Frequency of Dizziness  daily - depends on the movement    Duration of Dizziness  seconds     Aggravating Factors  Rolling to right;Activity in general;Looking up to the ceiling;Turning head quickly    Relieving Factors  Head stationary      Occulomotor Exam   Occulomotor Alignment  Normal    Spontaneous  Absent      Positional Testing   Dix-Hallpike  Dix-Hallpike Right;Dix-Hallpike Left      Dix-Hallpike Right   Dix-Hallpike Right Duration  approx. 15 secs    Dix-Hallpike Right Symptoms  Upbeat, right rotatory nystagmus      Dix-Hallpike Left   Dix-Hallpike Left Duration  none    Dix-Hallpike Left Symptoms  No nystagmus          Objective measurements completed on examination: See above findings.  PT Education - 04/19/18 2210    Education Details  info on etiology of BPPV and info on Epley maneuver for self treatment prn of BPPV    Person(s) Educated  Patient    Methods  Explanation;Demonstration;Handout    Comprehension  Verbalized understanding;Returned demonstration          PT Long Term Goals - 04/19/18 2219      PT LONG TERM GOAL #1   Title  N/A - eval only             Plan - 04/19/18 2214    Clinical Impression Statement  Pt presents with signs and symptoms of Rt posterior canalithiasis (Rt BPPV).  Pt was treated with 2 reps of Epley manuever with no c/o vertigo and no nystagmus noted on 2nd rep, indicative of resolution of Rt BPPV.  Pt reported no vertigo at end of session - did not wish to schedule a follow up at this time due to no vertigo experienced after treatment session.    History and Personal Factors relevant to plan of care:  h/o BPPV in 2013    Clinical Presentation  Stable    Clinical Presentation due to:  Rt BPPV    Clinical Decision Making  Low    Rehab Potential  Good    PT  Frequency  One time visit    PT Treatment/Interventions  Vestibular;Canalith Repostioning;Neuromuscular re-education;Patient/family education;ADLs/Self Care Home Management;Therapeutic exercise    PT Next Visit Plan  reassess vertigo - pt did not want to schedule a follow up appt after eval on 04-18-18 due to no vertigo    PT Home Exercise Plan  Epley maneuver    Consulted and Agree with Plan of Care  Patient       Patient will benefit from skilled therapeutic intervention in order to improve the following deficits and impairments:  Dizziness  Visit Diagnosis: BPPV (benign paroxysmal positional vertigo), right - Plan: PT plan of care cert/re-cert     Problem List Patient Active Problem List   Diagnosis Date Noted  . Empyema (HCC) 05/13/2016  . Psoriatic arthritis (HCC)   . Dyspnea 05/11/2016  . Pleural effusion 05/11/2016    Kary Kos, PT 04/19/2018, 10:24 PM  [facility] [address] [place], [place], [address] Phone: [phone]   Fax:  [phone]  Name: John [name] MRN: [id number] Date of Birth: [birth year]

## 2018-07-25 IMAGING — CR DG CHEST 2V
2 series · 2 of 2 positions shown · non-contrast
Comparison: Yesterday

CLINICAL DATA: Empyema.  History of right lung surgery

EXAM:
CHEST  2 VIEW

[chest pa]
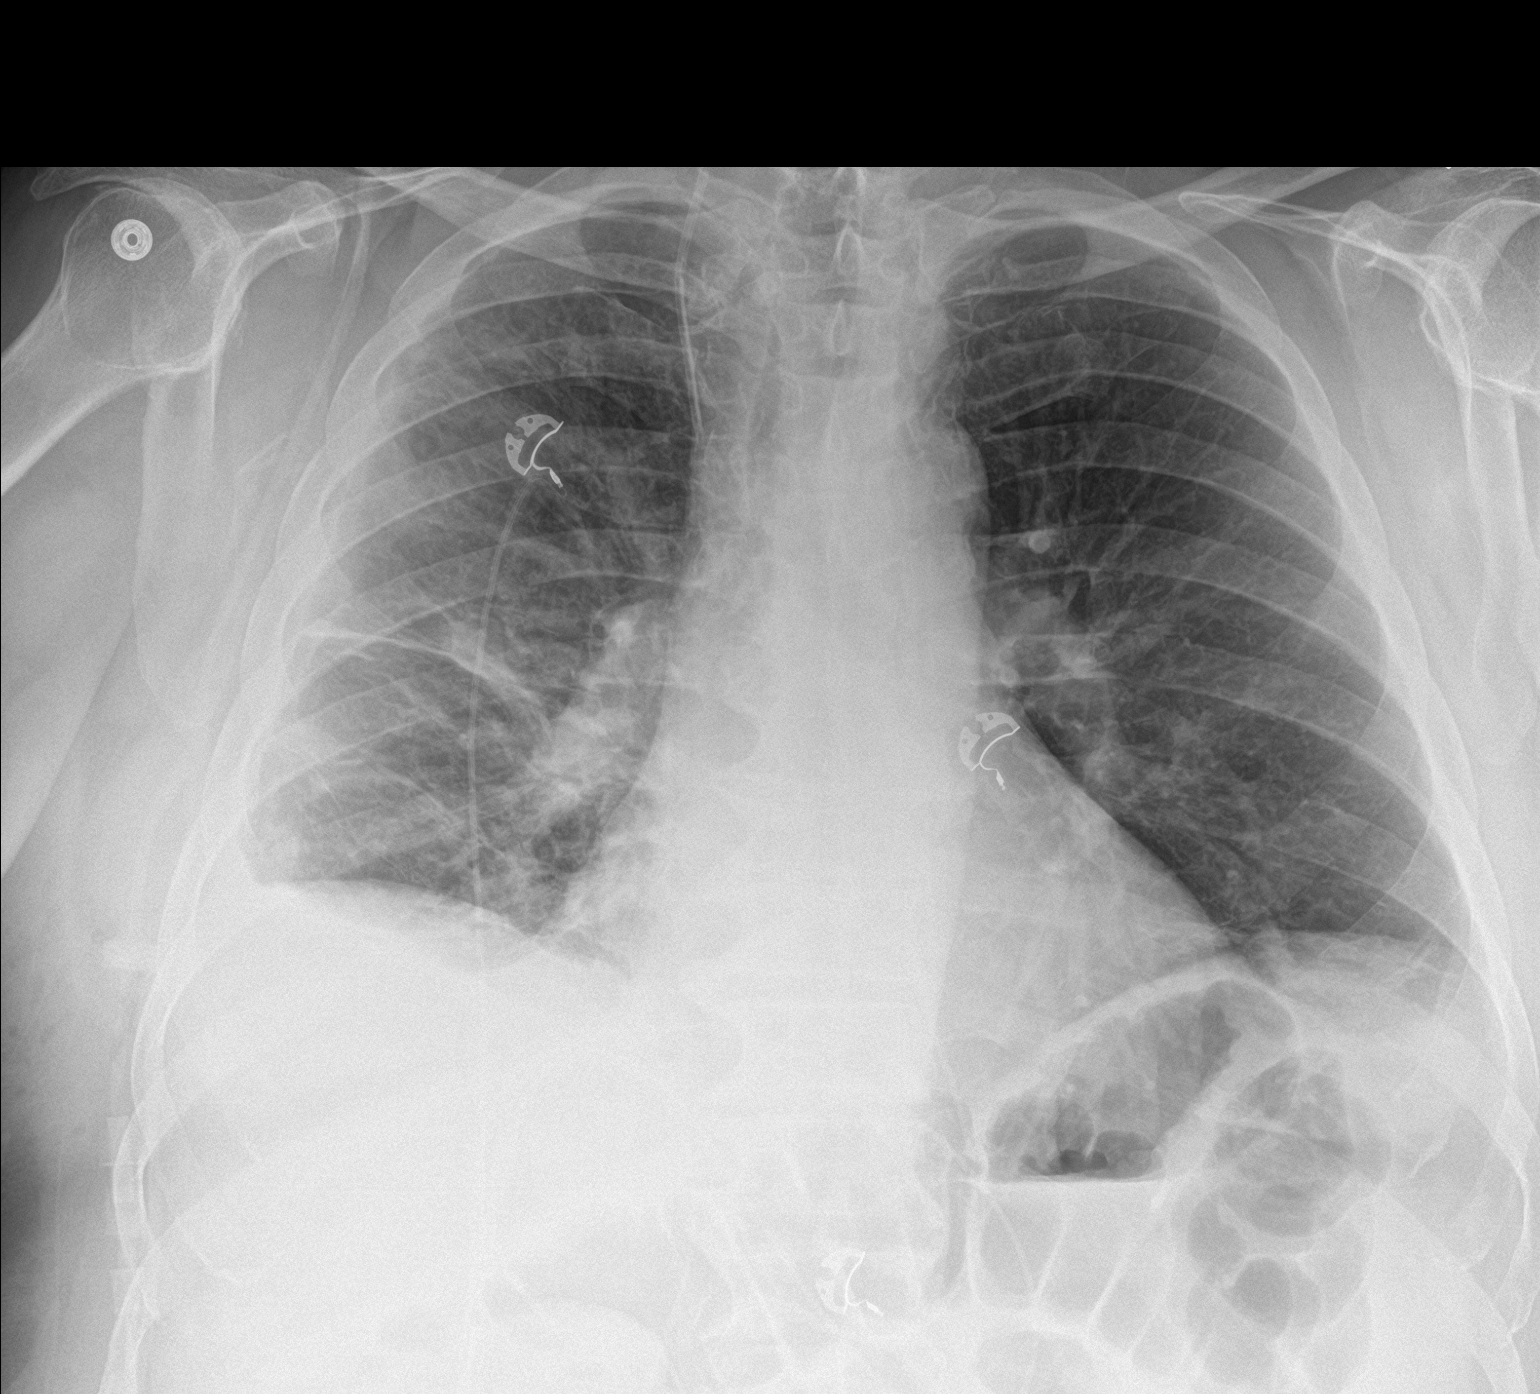

[chest lat]
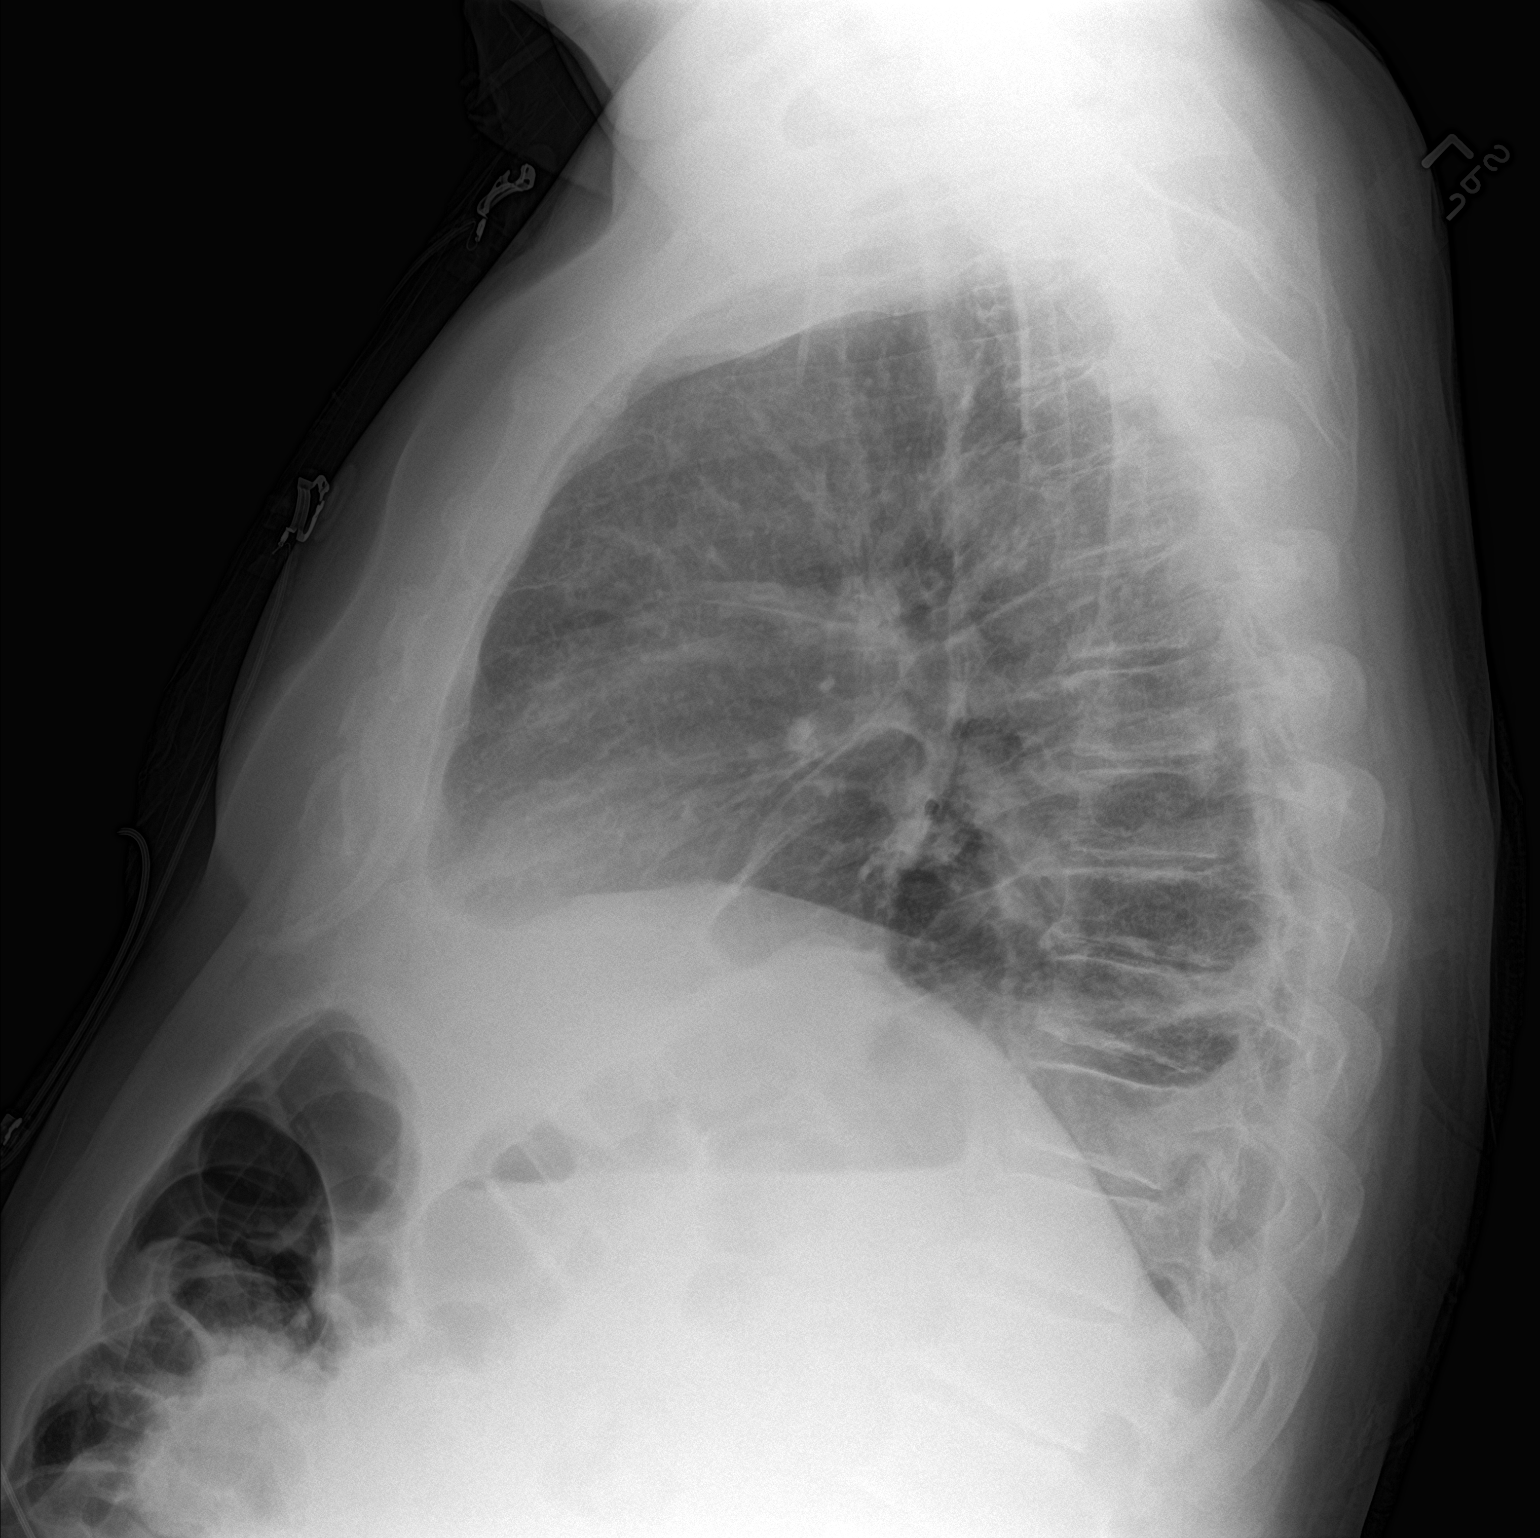

[2 of 2 positions shown; findings below may reference images not displayed]

FINDINGS: Right chest tube has been removed. No pneumothorax or re-
accumulated pleural fluid. Right IJ central line is likely clamped
at the neck; position is stable. Unchanged right lung opacity from
atelectasis. Left chest is clear. Normal heart size.
IMPRESSION: Right chest tube removal. No pneumothorax or increasing pleural
fluid.

## 2018-08-02 IMAGING — CR DG CHEST 2V
2 series · 2 of 2 positions shown · non-contrast
Comparison: 05/18/2016

CLINICAL DATA: Empyema postop 1 week

EXAM:
CHEST  2 VIEW

[w chest pa]
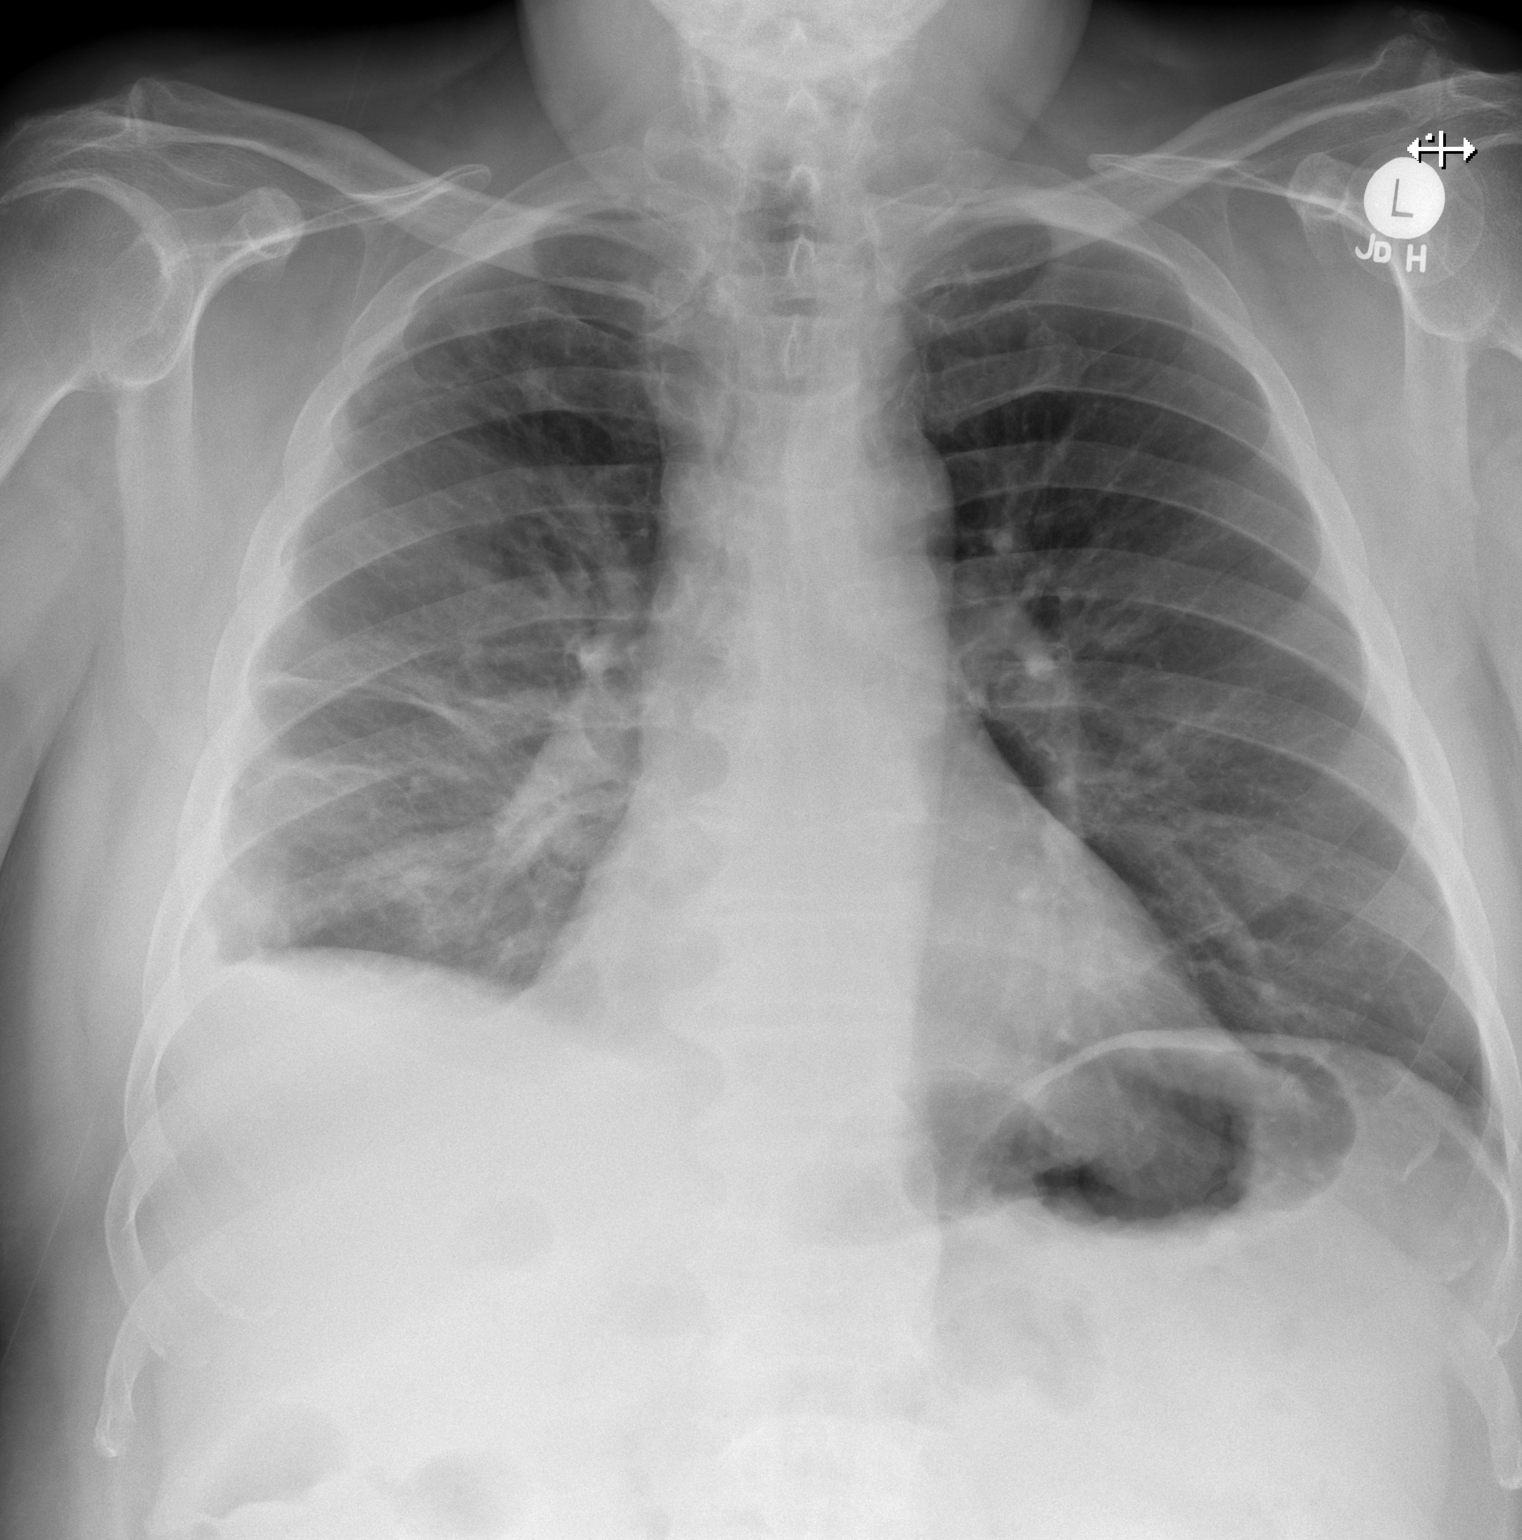

[w chest lat]
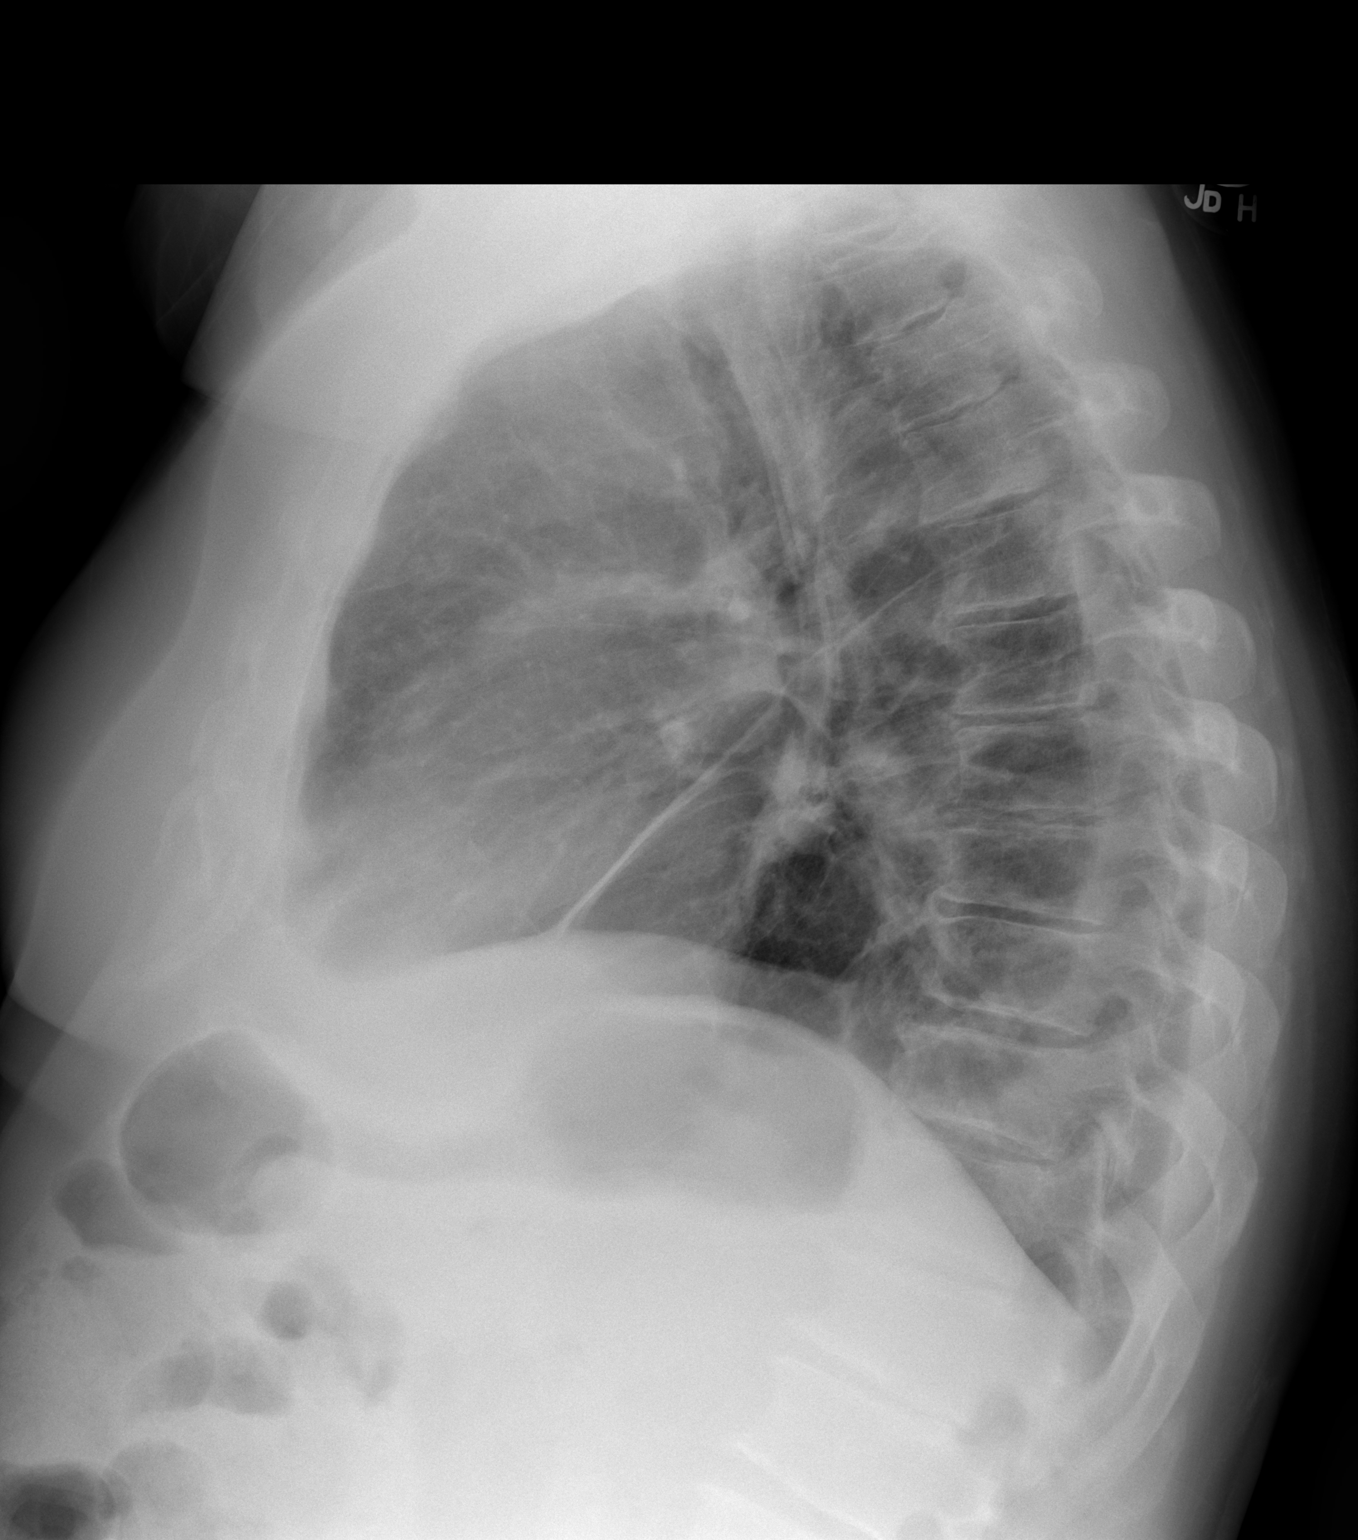

[2 of 2 positions shown; findings below may reference images not displayed]

FINDINGS: Cardiomediastinal silhouette is stable. Persistent streaky scarring
right perihilar and right lower lobe without significant change from
prior exam. No definite superimposed infiltrate or pulmonary edema.
Tiny amount of fluid along the right minor fissure. Left lung is
clear.
IMPRESSION: Persistent asymmetric streaky scarring right perihilar and right
lower lobe without superimposed infiltrate or pulmonary edema. Tiny
amount of fluid along the right minor fissure. Left lung is clear.

## 2018-08-22 DIAGNOSIS — I10 Essential (primary) hypertension: Secondary | ICD-10-CM | POA: Diagnosis not present

## 2018-08-22 DIAGNOSIS — S70361A Insect bite (nonvenomous), right thigh, initial encounter: Secondary | ICD-10-CM | POA: Diagnosis not present

## 2018-08-22 DIAGNOSIS — W57XXXA Bitten or stung by nonvenomous insect and other nonvenomous arthropods, initial encounter: Secondary | ICD-10-CM | POA: Diagnosis not present

## 2018-09-22 DIAGNOSIS — R739 Hyperglycemia, unspecified: Secondary | ICD-10-CM | POA: Diagnosis not present

## 2018-09-22 DIAGNOSIS — E785 Hyperlipidemia, unspecified: Secondary | ICD-10-CM | POA: Diagnosis not present

## 2018-09-22 DIAGNOSIS — I10 Essential (primary) hypertension: Secondary | ICD-10-CM | POA: Diagnosis not present

## 2018-09-22 DIAGNOSIS — E291 Testicular hypofunction: Secondary | ICD-10-CM | POA: Diagnosis not present

## 2018-09-29 DIAGNOSIS — G4733 Obstructive sleep apnea (adult) (pediatric): Secondary | ICD-10-CM | POA: Diagnosis not present

## 2018-09-29 DIAGNOSIS — E291 Testicular hypofunction: Secondary | ICD-10-CM | POA: Diagnosis not present

## 2018-09-29 DIAGNOSIS — I10 Essential (primary) hypertension: Secondary | ICD-10-CM | POA: Diagnosis not present

## 2018-09-29 DIAGNOSIS — E785 Hyperlipidemia, unspecified: Secondary | ICD-10-CM | POA: Diagnosis not present

## 2018-09-29 DIAGNOSIS — L4050 Arthropathic psoriasis, unspecified: Secondary | ICD-10-CM | POA: Diagnosis not present

## 2018-09-29 DIAGNOSIS — J90 Pleural effusion, not elsewhere classified: Secondary | ICD-10-CM | POA: Diagnosis not present

## 2018-09-29 DIAGNOSIS — R739 Hyperglycemia, unspecified: Secondary | ICD-10-CM | POA: Diagnosis not present

## 2018-11-13 DIAGNOSIS — F411 Generalized anxiety disorder: Secondary | ICD-10-CM | POA: Diagnosis not present

## 2018-11-13 DIAGNOSIS — G47419 Narcolepsy without cataplexy: Secondary | ICD-10-CM | POA: Diagnosis not present

## 2018-11-13 DIAGNOSIS — F3342 Major depressive disorder, recurrent, in full remission: Secondary | ICD-10-CM | POA: Diagnosis not present

## 2018-11-13 DIAGNOSIS — F900 Attention-deficit hyperactivity disorder, predominantly inattentive type: Secondary | ICD-10-CM | POA: Diagnosis not present

## 2019-04-02 DIAGNOSIS — L4050 Arthropathic psoriasis, unspecified: Secondary | ICD-10-CM | POA: Diagnosis not present

## 2019-04-02 DIAGNOSIS — I10 Essential (primary) hypertension: Secondary | ICD-10-CM | POA: Diagnosis not present

## 2019-04-02 DIAGNOSIS — E7800 Pure hypercholesterolemia, unspecified: Secondary | ICD-10-CM | POA: Diagnosis not present

## 2019-04-02 DIAGNOSIS — G4733 Obstructive sleep apnea (adult) (pediatric): Secondary | ICD-10-CM | POA: Diagnosis not present

## 2019-04-02 DIAGNOSIS — Z0000 Encounter for general adult medical examination without abnormal findings: Secondary | ICD-10-CM | POA: Diagnosis not present

## 2019-04-02 DIAGNOSIS — D649 Anemia, unspecified: Secondary | ICD-10-CM | POA: Diagnosis not present

## 2019-04-02 DIAGNOSIS — Z23 Encounter for immunization: Secondary | ICD-10-CM | POA: Diagnosis not present

## 2019-04-02 DIAGNOSIS — E291 Testicular hypofunction: Secondary | ICD-10-CM | POA: Diagnosis not present

## 2019-05-03 DIAGNOSIS — F900 Attention-deficit hyperactivity disorder, predominantly inattentive type: Secondary | ICD-10-CM | POA: Diagnosis not present

## 2019-05-03 DIAGNOSIS — G47419 Narcolepsy without cataplexy: Secondary | ICD-10-CM | POA: Diagnosis not present

## 2019-05-03 DIAGNOSIS — F411 Generalized anxiety disorder: Secondary | ICD-10-CM | POA: Diagnosis not present

## 2019-05-03 DIAGNOSIS — F3342 Major depressive disorder, recurrent, in full remission: Secondary | ICD-10-CM | POA: Diagnosis not present

## 2019-05-04 DIAGNOSIS — Z79891 Long term (current) use of opiate analgesic: Secondary | ICD-10-CM | POA: Diagnosis not present

## 2019-06-14 DIAGNOSIS — F900 Attention-deficit hyperactivity disorder, predominantly inattentive type: Secondary | ICD-10-CM | POA: Diagnosis not present

## 2019-06-14 DIAGNOSIS — G47419 Narcolepsy without cataplexy: Secondary | ICD-10-CM | POA: Diagnosis not present

## 2019-06-14 DIAGNOSIS — F411 Generalized anxiety disorder: Secondary | ICD-10-CM | POA: Diagnosis not present

## 2019-06-14 DIAGNOSIS — F3342 Major depressive disorder, recurrent, in full remission: Secondary | ICD-10-CM | POA: Diagnosis not present

## 2019-06-15 ENCOUNTER — Ambulatory Visit: Payer: Medicare Other | Attending: Internal Medicine

## 2019-06-15 DIAGNOSIS — Z23 Encounter for immunization: Secondary | ICD-10-CM | POA: Insufficient documentation

## 2019-06-15 NOTE — Progress Notes (Signed)
   Covid-19 Vaccination Clinic  Name:  John [name]    MRN: [id number] DOB: 08-16-1955  06/15/2019  [name] was observed post Covid-19 immunization for 15 minutes without incident. He was provided with Vaccine Information Sheet and instruction to access the V-Safe system.   [name] was instructed to call 911 with any severe reactions post vaccine: [name] Kitchen Difficulty breathing  . Swelling of face and throat  . A fast heartbeat  . A bad rash all over body  . Dizziness and weakness   Immunizations Administered    Name Date Dose VIS Date Route   Pfizer COVID-19 Vaccine 06/15/2019  8:51 AM 0.3 mL 03/23/2019 Intramuscular   Manufacturer: [employer], [employer]   Lot: [id number]   NDC: [address]-1

## 2019-07-05 ENCOUNTER — Other Ambulatory Visit: Payer: Self-pay

## 2019-07-05 ENCOUNTER — Telehealth: Payer: Self-pay | Admitting: Neurology

## 2019-07-05 ENCOUNTER — Ambulatory Visit (INDEPENDENT_AMBULATORY_CARE_PROVIDER_SITE_OTHER): Payer: Medicare Other | Admitting: Neurology

## 2019-07-05 ENCOUNTER — Encounter: Payer: Self-pay | Admitting: Neurology

## 2019-07-05 VITALS — BP 109/65 | HR 67 | Temp 96.8°F | Ht 66.0 in | Wt 244.0 lb

## 2019-07-05 DIAGNOSIS — R413 Other amnesia: Secondary | ICD-10-CM | POA: Insufficient documentation

## 2019-07-05 MED ORDER — DIVALPROEX SODIUM ER 500 MG PO TB24
500.0000 mg | ORAL_TABLET | Freq: Every day | ORAL | 11 refills | Status: DC
Start: 2019-07-05 — End: 2019-10-09

## 2019-07-05 NOTE — Progress Notes (Addendum)
PATIENT: John [name] DOB: 12/14/1955  Chief Complaint  Patient presents with  . Memory Loss    MMSE 30/30 - 11 animals. He is here with his wife, John [name], for memory loss.  John [name] Kitchen PCP    John Gravel, MD  . Psychiatry    John Headland, NP - referring provider     HISTORICAL  John [name] is a 64 year old male, seen in request by his psychiatrist nurse practitioner John [name], and his primary care physician Dr. Jani [name] for evaluation of memory loss,  I have reviewed and summarized the referring note from the referring physician.  He has past medical history of generalized anxiety disorder, is under the care of [facility], per referring note on May 03, 2019, he is married for 72 years, currently separated, lives alone, retired [employer] man. He is recently separated from his wife, because she believes his hoarding items in their 2 car garage, he had several junk drawers  and find it difficult to get rid of them, he has 20 screwdrivers, 20 hammers, collected over the years, he know he does not need them but has difficulty getting rid of excessive items.  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, he described frequent episode of loss of train of thoughts during conversation, start into middle of conversation, he also described frequent episode of word finding difficulties, he can see it through his mind, but could not say it, he also describes lifelong history of difficulty staying on the project, he often started a yard project, but unable to finish them, he also describes episode of watching something on his cell phone, suddenly woke up from sleep, because 4 has dropped off his hand, he has a diagnosis of obstructive sleep apnea, under the care of [place] [facility], using CPAP machine.  His memory  loss is gradually getting worse over the past 10 years,  Both his parents died of Alzheimer's disease, mother at age 53, father at age 59, he denied a family history of seizure,  REVIEW OF SYSTEMS: Full 14 system review of systems performed and notable only for as above All other review of systems were negative.  ALLERGIES: Allergies  Allergen Reactions  . Clindamycin/Lincomycin Rash  . Meropenem Rash  . Penicillins Rash    ++ pt's wife reported he tolerated keflex in the past++ Has patient had a PCN reaction causing immediate rash, facial/tongue/throat swelling, SOB or lightheadedness with hypotension: No Has patient had a PCN reaction causing severe rash involving mucus membranes or skin necrosis: No Has patient had a PCN reaction that required hospitalization No Has patient had a PCN reaction occurring within the last 10 years: No If all of the above answers are "NO", then may proceed with Cephalosporin use.    HOME MEDICATIONS: Current Outpatient Medications  Medication Sig Dispense Refill  . Armodafinil 250 MG tablet Take 250 mg by mouth daily.    John [name] Kitchen atomoxetine (STRATTERA) 100 MG capsule Take 100 mg by mouth daily.    . benzonatate (TESSALON) 100 MG capsule Take 100 mg by mouth daily as needed for cough.    John [name] Kitchen buPROPion (WELLBUTRIN XL) 300 MG 24 hr tablet Take 300 mg by mouth daily.    . carvedilol (COREG) 3.125 MG tablet Take 3.125 mg by mouth 2 (two) times daily with a meal.    .  Cinnamon 500 MG capsule Take 500 mg by mouth daily.    . diclofenac (VOLTAREN) 75 MG EC tablet Take 75 mg by mouth daily.    . fish oil-omega-3 fatty acids 1000 MG capsule Take 2 g by mouth in the morning and at bedtime.     John [name] Kitchen FLUoxetine (PROZAC) 20 MG tablet Take 60 mg by mouth daily.    . folic acid (FOLVITE) 1 MG tablet Take 1 mg by mouth daily.    John [name] Kitchen GINSENG EXTRACT PO Take 400 mg by mouth daily.    . Glucosamine HCl (GLUCOSAMINE PO) Take 500 mg by mouth daily.    John [name] Kitchen l-methylfolate-B6-B12 (METANX)  3-35-2 MG TABS Take 1 tablet by mouth daily.    John [name] Kitchen losartan (COZAAR) 25 MG tablet Take 25 mg by mouth daily.    . modafinil (PROVIGIL) 200 MG tablet Take 200 mg by mouth daily.    . Multiple Minerals (CALCIUM-MAGNESIUM-ZINC) TABS Take 1 tablet by mouth daily.    . Multiple Vitamin (MULTIVITAMIN) capsule Take 1 capsule by mouth daily.     No current facility-administered medications for this visit.    PAST MEDICAL HISTORY: Past Medical History:  Diagnosis Date  . Depression   . Hyperlipemia   . Hypertension   . Memory loss   . Psoriatic arthritis (HCC)     PAST SURGICAL HISTORY: Past Surgical History:  Procedure Laterality Date  . TONSILLECTOMY    . VIDEO ASSISTED THORACOSCOPY (VATS)/EMPYEMA Right 05/13/2016   Procedure: VIDEO ASSISTED THORACOSCOPY (VATS)/DRAIN EMPYEMA;  Surgeon: Alleen Borne, MD;  Location: [facility] OR;  Service: Thoracic;  Laterality: Right;    FAMILY HISTORY: Family History  Problem Relation Age of Onset  . COPD Mother        died at 80  . Alzheimer's disease Mother   . Diabetes Father        died at 70  . Alzheimer's disease Father   . Early death Paternal Aunt     SOCIAL HISTORY: Social History   Socioeconomic History  . Marital status: Married    Spouse name: Not on file  . Number of children: 1  . Years of education: 68  . Highest education level: High school graduate  Occupational History  . Occupation: Retired  Tobacco Use  . Smoking status: Former Smoker    Types: Cigarettes    Quit date: 07/2000    Years since quitting: 18.9  . Smokeless tobacco: Never Used  Substance and Sexual Activity  . Alcohol use: Yes    Comment: one drink every two months  . Drug use: Never  . Sexual activity: Not Currently  Other Topics Concern  . Not on file  Social History Narrative   Lives with wife.   Right-handed.   No daily caffeine use.   Social Determinants of Health   Financial Resource Strain:   . Difficulty of Paying Living Expenses:   Food  Insecurity:   . Worried About Programme researcher, broadcasting/film/video in the Last Year:   . Barista in the Last Year:   Transportation Needs:   . Freight forwarder (Medical):   John [name] Kitchen Lack of Transportation (Non-Medical):   Physical Activity:   . Days of Exercise per Week:   . Minutes of Exercise per Session:   Stress:   . Feeling of Stress :   Social Connections:   . Frequency of Communication with Friends and Family:   . Frequency of Social Gatherings with Friends and Family:   .  Attends Religious Services:   . Active Member of Clubs or Organizations:   . Attends Banker Meetings:   John [name] Kitchen Marital Status:   Intimate Partner Violence:   . Fear of Current or Ex-Partner:   . Emotionally Abused:   John [name] Kitchen Physically Abused:   . Sexually Abused:      PHYSICAL EXAM   Vitals:   07/05/19 0816  BP: 109/65  Pulse: 67  Temp: (!) 96.8 F (36 C)  Weight: 244 lb (110.7 kg)  Height: 5\' 6"  (1.676 m)    Not recorded      Body mass index is 39.38 kg/m.  PHYSICAL EXAMNIATION:  Gen: NAD, conversant, well nourised, well groomed                     Cardiovascular: Regular rate rhythm, no peripheral edema, warm, nontender. Eyes: Conjunctivae clear without exudates or hemorrhage Neck: Supple, no carotid bruits. Pulmonary: Clear to auscultation bilaterally   NEUROLOGICAL EXAM:  MENTAL STATUS: MMSE - Mini Mental State Exam 07/05/2019  Orientation to time 5  Orientation to Place 5  Registration 3  Attention/ Calculation 5  Recall 3  Language- name 2 objects 2  Language- repeat 1  Language- follow 3 step command 3  Language- read & follow direction 1  Write a sentence 1  Copy design 1  Total score 30  animal naming 10   CRANIAL NERVES: CN II: Visual fields are full to confrontation. Pupils are round equal and briskly reactive to light. CN III, IV, VI: extraocular movement are normal. No ptosis. CN V: Facial sensation is intact to light touch CN VII: Face is symmetric with normal  eye closure  CN VIII: Hearing is normal to causal conversation. CN IX, X: Phonation is normal. CN XI: Head turning and shoulder shrug are intact  MOTOR: There is no pronator drift of out-stretched arms. Muscle bulk and tone are normal. Muscle strength is normal.  REFLEXES: Reflexes are 2+ and symmetric at the biceps, triceps, knees, and ankles. Plantar responses are flexor.  SENSORY: Intact to light touch, pinprick and vibratory sensation are intact in fingers and toes.  COORDINATION: There is no trunk or limb dysmetria noted.  GAIT/STANCE: Posture is normal. Gait is steady with normal steps, base, arm swing, and turning. Heel and toe walking are normal. Tandem gait is normal.  Romberg is absent.   DIAGNOSTIC DATA (LABS, IMAGING, TESTING) - I reviewed patient records, labs, notes, testing and imaging myself where available.   ASSESSMENT AND PLAN  NICHOLES HIBLER is a 64 y.o. male   Memory loss, word finding difficulties,  On further questioning, this is really a lifelong history of his frustration, he described difficulty focusing at school, frequently lapse of memory, word finding difficulties, and of depression anxiety  Differentiation diagnosis including mood disorder related, also need to rule out possibility of absence seizure  MRI of the brain with without contrast  EEG  Empirically treat with Depakote ER 500 mg once every night  He recently had laboratory evaluation from primary care physician, we will get record   76, M.D. Ph.D.  [facility] [address] [place], [place] Ph: [phone] Fax: [phone]  CC: [phone], NP, Jonathon Bellows, MD  Addendum: I reviewed laboratory evaluation July 31, 2019, normal CMP, creatinine of 1.0, hemoglobin was mildly decreased 12.3, lipid panel showed LDL 91, HDL 56, previously CBC in June 2020 was 14,

## 2019-07-05 NOTE — Telephone Encounter (Signed)
Medicare/aarp order sent to [facility]. No auth they will reach out to the patient to schedule.  

## 2019-07-11 ENCOUNTER — Ambulatory Visit: Payer: Medicare Other | Attending: Internal Medicine

## 2019-07-11 DIAGNOSIS — Z23 Encounter for immunization: Secondary | ICD-10-CM

## 2019-07-11 NOTE — Progress Notes (Signed)
   Covid-19 Vaccination Clinic  Name:  John [name]    MRN: [id number] DOB: Jul 03, 1955  07/11/2019  [name] was observed post Covid-19 immunization for 15 minutes without incident. He was provided with Vaccine Information Sheet and instruction to access the V-Safe system.   [name] was instructed to call 911 with any severe reactions post vaccine: [name] Kitchen Difficulty breathing  . Swelling of face and throat  . A fast heartbeat  . A bad rash all over body  . Dizziness and weakness   Immunizations Administered    Name Date Dose VIS Date Route   Pfizer COVID-19 Vaccine 07/11/2019  9:11 AM 0.3 mL 03/23/2019 Intramuscular   Manufacturer: [employer], [employer]   Lot: [id number]   NDC: [address]-7

## 2019-07-18 ENCOUNTER — Other Ambulatory Visit: Payer: Medicare Other

## 2019-08-01 ENCOUNTER — Other Ambulatory Visit: Payer: Self-pay

## 2019-08-01 ENCOUNTER — Ambulatory Visit (INDEPENDENT_AMBULATORY_CARE_PROVIDER_SITE_OTHER): Payer: Medicare Other | Admitting: Neurology

## 2019-08-01 DIAGNOSIS — R410 Disorientation, unspecified: Secondary | ICD-10-CM | POA: Diagnosis not present

## 2019-08-01 DIAGNOSIS — R413 Other amnesia: Secondary | ICD-10-CM

## 2019-08-10 NOTE — Procedures (Signed)
   HISTORY: 64 year old male with history of memory loss, TECHNIQUE:  This is a routine 16 channel EEG recording with one channel devoted to a limited EKG recording.  It was performed during wakefulness, drowsiness and asleep.  Hyperventilation and photic stimulation were performed as activating procedures.  There are frequent motion and eye movement artifact  Upon maximum arousal, posterior dominant waking rhythm consistent of rhythmic alpha range activity, with frequency of 8 hz. Activities are symmetric over the bilateral posterior derivations and attenuated with eye opening.  Hyperventilation produced mild/moderate buildup with higher amplitude and the slower activities noted.  Photic stimulation did not alter the tracing.  During EEG recording, patient developed drowsiness and no deeper stage of sleep was achieved  During EEG recording, there was no epileptiform discharge noted.  EKG demonstrate sinus rhythm, with heart rate of 72 bpm  CONCLUSION: This is a  normal awake EEG.  There is no electrodiagnostic evidence of epileptiform discharge.  Levert Feinstein, M.D. Ph.D.  [facility] [address] [place], [place] [address] Phone: [phone] Fax:      [phone]

## 2019-08-11 ENCOUNTER — Other Ambulatory Visit: Payer: Self-pay

## 2019-08-11 ENCOUNTER — Ambulatory Visit
Admission: RE | Admit: 2019-08-11 | Discharge: 2019-08-11 | Disposition: A | Discharge status: Home / Self Care | Payer: Medicare Other | Source: Ambulatory Visit | Attending: Neurology | Admitting: Neurology

## 2019-08-11 DIAGNOSIS — R413 Other amnesia: Secondary | ICD-10-CM

## 2019-08-11 MED ORDER — GADOBENATE DIMEGLUMINE 529 MG/ML IV SOLN
20.0000 mL | Freq: Once | INTRAVENOUS | Status: AC | PRN
  Administered 2019-08-11: 20 mL via INTRAVENOUS

## 2019-08-13 ENCOUNTER — Telehealth: Payer: Self-pay | Admitting: Neurology

## 2019-08-13 NOTE — Telephone Encounter (Signed)
I was able to speak with the patient to provide him with the MRI results below. He will keep his pending appt for further review with Dr. Terrace Arabia.

## 2019-08-13 NOTE — Telephone Encounter (Signed)
IMPRESSION: This MRI of the brain with and without contrast shows the following: 1.  Multiple T2/FLAIR hyperintense foci predominantly in the deep white matter and also in the periventricular and subcortical white matter.  There are confluent foci adjacent to the frontal horns of the lateral ventricles and in the periatrial deep white matter.  None of the foci appear to be acute. 2.   Normal enhancement pattern and no acute findings.    Please call patient, MRI of the brain showed generalized atrophy, periventricular small vessel disease,  Will review film with him at next follow up visit

## 2019-09-25 DIAGNOSIS — E291 Testicular hypofunction: Secondary | ICD-10-CM | POA: Diagnosis not present

## 2019-09-25 DIAGNOSIS — Z79899 Other long term (current) drug therapy: Secondary | ICD-10-CM | POA: Diagnosis not present

## 2019-09-25 DIAGNOSIS — D649 Anemia, unspecified: Secondary | ICD-10-CM | POA: Diagnosis not present

## 2019-10-09 ENCOUNTER — Other Ambulatory Visit: Payer: Self-pay

## 2019-10-09 ENCOUNTER — Ambulatory Visit (INDEPENDENT_AMBULATORY_CARE_PROVIDER_SITE_OTHER): Payer: Medicare Other | Admitting: Neurology

## 2019-10-09 ENCOUNTER — Encounter: Payer: Self-pay | Admitting: Neurology

## 2019-10-09 VITALS — BP 128/65 | HR 73 | Ht 66.0 in | Wt 243.5 lb

## 2019-10-09 DIAGNOSIS — I679 Cerebrovascular disease, unspecified: Secondary | ICD-10-CM | POA: Diagnosis not present

## 2019-10-09 DIAGNOSIS — R413 Other amnesia: Secondary | ICD-10-CM

## 2019-10-09 NOTE — Progress Notes (Signed)
PATIENT: John [name] DOB: September 28, 1955  Chief Complaint  Patient presents with  . Memory Loss    He is here with his wife, Larita Fife, to review his MRI brain and EEG results. He has continued taking Depakote ER 500mg  QHS without any adverse side effects.     HISTORICAL  John [name] is a 64 year old male, seen in request by his psychiatrist nurse practitioner 76, and his primary care physician Dr. Horatio Pel for evaluation of memory loss,  I have reviewed and summarized the referring note from the referring physician.  He has past medical history of generalized anxiety disorder, is under the care of [facility], per referring note on May 03, 2019, he is married for 32 years, currently separated, lives alone, retired May 05, 2019 man. He is recently separated from his wife, he thought it is due to his hoarding habits, he has filled his two car garages, he had several junk drawers  and find it difficult to get rid of them, he has 20 screwdrivers, 20 hammers, collected over the years, he know he does not need them but has difficulty getting rid of excessive items.  Patient reported lifelong history of difficulty focusing, was previously treated with ADHD, take Adderall for a long time, which did help his symptoms some, during his job as a [employer], he described frequent episode of loss of train of thoughts during conversation, start into middle of conversation, he also described frequent episode of word finding difficulties, he can see it through his mind, but could not say it, he also describes lifelong history of difficulty staying on the project, he often started a yard project, but unable to finish them, he also describes episode of watching something on his cell phone, suddenly woke up from sleep, because 4 has dropped off his hand, he has a diagnosis of obstructive sleep apnea, under the care of [facility], using CPAP machine.  His  memory loss is gradually getting worse over the past 10 years,  Both his parents died of Alzheimer's disease, mother at age 55, father at age 81, he denied a family history of seizure,  UPDATE October 09 2019: He is with his wife at today's visit, his wife lives with her mother, to take care of her elderly mother.  We have reviewed MRI of the brain, generalized atrophy, moderate supratentorium small vessel disease, there was no acute abnormalities.EEG was normal. 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, busy all day long, but if he sits still watching TV for a while, it tends to for him to sleep,  Laboratory evaluation on July 31, 2019, normal CMP, creatinine of 1.0, hemoglobin was mildly decreased 12.3, lipid panel showed LDL 91, HDL 56, previously CBC in June 2020 was 14,  Laboratory evaluations on September 25, 2019, hemoglobin of 12.6, normal CMP, testosterone level was mildly decreased at 60, with normal free testosterone level, normal PSA, A1c 5.3, normal B12, ferritin level was 71, iron panel showed iron saturation within normal limits 17,  REVIEW OF SYSTEMS: Full 14 system review of systems performed and notable only for as above All other review of systems were negative.  ALLERGIES: Allergies  Allergen Reactions  . Clindamycin/Lincomycin Rash  . Meropenem Rash  . Penicillins Rash    ++ pt's wife reported he tolerated keflex  in the past++ Has patient had a PCN reaction causing immediate rash, facial/tongue/throat swelling, SOB or lightheadedness with hypotension: No Has patient had a PCN reaction causing severe rash involving mucus membranes or skin necrosis: No Has patient had a PCN reaction that required hospitalization No Has patient had a PCN reaction occurring within the last 10 years: No If all of the above answers are "NO", then may proceed  with Cephalosporin use.    HOME MEDICATIONS: Current Outpatient Medications  Medication Sig Dispense Refill  . Armodafinil 250 MG tablet Take 250 mg by mouth daily.    [name] Kitchen. atomoxetine (STRATTERA) 100 MG capsule Take 100 mg by mouth daily.    . benzonatate (TESSALON) 100 MG capsule Take 100 mg by mouth daily as needed for cough.    [name] Kitchen. buPROPion (WELLBUTRIN XL) 300 MG 24 hr tablet Take 300 mg by mouth daily.    . carvedilol (COREG) 3.125 MG tablet Take 3.125 mg by mouth 2 (two) times daily with a meal.    . Cinnamon 500 MG capsule Take 500 mg by mouth daily.    . diclofenac (VOLTAREN) 75 MG EC tablet Take 75 mg by mouth daily.    . divalproex (DEPAKOTE ER) 500 MG 24 hr tablet Take 1 tablet (500 mg total) by mouth at bedtime. 30 tablet 11  . fish oil-omega-3 fatty acids 1000 MG capsule Take 2 g by mouth in the morning and at bedtime.     [name] Kitchen. FLUoxetine (PROZAC) 20 MG tablet Take 60 mg by mouth daily.    . folic acid (FOLVITE) 1 MG tablet Take 1 mg by mouth daily.    [name] Kitchen. GINSENG EXTRACT PO Take 400 mg by mouth daily.    . Glucosamine HCl (GLUCOSAMINE PO) Take 500 mg by mouth daily.    [name] Kitchen. l-methylfolate-B6-B12 (METANX) 3-35-2 MG TABS Take 1 tablet by mouth daily.    [name] Kitchen. losartan (COZAAR) 25 MG tablet Take 25 mg by mouth daily.    . modafinil (PROVIGIL) 200 MG tablet Take 200 mg by mouth daily.    . Multiple Minerals (CALCIUM-MAGNESIUM-ZINC) TABS Take 1 tablet by mouth daily.    . Multiple Vitamin (MULTIVITAMIN) capsule Take 1 capsule by mouth daily.     No current facility-administered medications for this visit.    PAST MEDICAL HISTORY: Past Medical History:  Diagnosis Date  . Depression   . Hyperlipemia   . Hypertension   . Memory loss   . Psoriatic arthritis (HCC)     PAST SURGICAL HISTORY: Past Surgical History:  Procedure Laterality Date  . TONSILLECTOMY    . VIDEO ASSISTED THORACOSCOPY (VATS)/EMPYEMA Right 05/13/2016   Procedure: VIDEO ASSISTED THORACOSCOPY (VATS)/DRAIN EMPYEMA;   Surgeon: Alleen BorneBryan K Bartle, MD;  Location: [facility] OR;  Service: Thoracic;  Laterality: Right;    FAMILY HISTORY: Family History  Problem Relation Age of Onset  . COPD Mother        died at 6683  . Alzheimer's disease Mother   . Diabetes Father        died at 3996  . Alzheimer's disease Father   . Early death Paternal Aunt     SOCIAL HISTORY: Social History   Socioeconomic History  . Marital status: Married    Spouse name: Not on file  . Number of children: 1  . Years of education: 8612  . Highest education level: High school graduate  Occupational History  . Occupation: Retired  Tobacco Use  . Smoking status: Former Smoker    Types:  Cigarettes    Quit date: 07/2000    Years since quitting: 19.2  . Smokeless tobacco: Never Used  Substance and Sexual Activity  . Alcohol use: Yes    Comment: one drink every two months  . Drug use: Never  . Sexual activity: Not Currently  Other Topics Concern  . Not on file  Social History Narrative   Lives with wife.   Right-handed.   No daily caffeine use.   Social Determinants of Health   Financial Resource Strain:   . Difficulty of Paying Living Expenses:   Food Insecurity:   . Worried About Programme researcher, broadcasting/film/video in the Last Year:   . Barista in the Last Year:   Transportation Needs:   . Freight forwarder (Medical):   [name] Kitchen Lack of Transportation (Non-Medical):   Physical Activity:   . Days of Exercise per Week:   . Minutes of Exercise per Session:   Stress:   . Feeling of Stress :   Social Connections:   . Frequency of Communication with Friends and Family:   . Frequency of Social Gatherings with Friends and Family:   . Attends Religious Services:   . Active Member of Clubs or Organizations:   . Attends Banker Meetings:   [name] Kitchen Marital Status:   Intimate Partner Violence:   . Fear of Current or Ex-Partner:   . Emotionally Abused:   [name] Kitchen Physically Abused:   . Sexually Abused:      PHYSICAL EXAM   Vitals:     10/09/19 0906  BP: 128/65  Pulse: 73  Weight: 243 lb 8 oz (110.5 kg)  Height: 5\' 6"  (1.676 m)   Not recorded     Body mass index is 39.3 kg/m.  PHYSICAL EXAMNIATION:  Gen: NAD, conversant, well nourised, well groomed                     Cardiovascular: Regular rate rhythm, no peripheral edema, warm, nontender. Eyes: Conjunctivae clear without exudates or hemorrhage Neck: Supple, no carotid bruits. Pulmonary: Clear to auscultation bilaterally   NEUROLOGICAL EXAM:  MENTAL STATUS: MMSE - Mini Mental State Exam 07/05/2019  Orientation to time 5  Orientation to Place 5  Registration 3  Attention/ Calculation 5  Recall 3  Language- name 2 objects 2  Language- repeat 1  Language- follow 3 step command 3  Language- read & follow direction 1  Write a sentence 1  Copy design 1  Total score 30  animal naming 10   CRANIAL NERVES: CN II: Visual fields are full to confrontation. Pupils are round equal and briskly reactive to light. CN III, IV, VI: extraocular movement are normal. No ptosis. CN V: Facial sensation is intact to light touch CN VII: Face is symmetric with normal eye closure  CN VIII: Hearing is normal to causal conversation. CN IX, X: Phonation is normal. CN XI: Head turning and shoulder shrug are intact  MOTOR: There is no pronator drift of out-stretched arms. Muscle bulk and tone are normal. Muscle strength is normal.  REFLEXES: Reflexes are 2+ and symmetric at the biceps, triceps, knees, and ankles. Plantar responses are flexor.  SENSORY: Intact to light touch,   COORDINATION: There is no trunk or limb dysmetria noted.  GAIT/STANCE: Posture is normal. Gait is steady with normal steps, base, arm swing, and turning.    DIAGNOSTIC DATA (LABS, IMAGING, TESTING) - I reviewed patient records, labs, notes, testing and imaging myself where available.  ASSESSMENT AND PLAN  John [name] is a 64 y.o. male   Memory loss, word finding  difficulties, Cerebral small vessel disease  History of confirmed by patient and his wife, this is really a lifelong history of his frustration, he described difficulty focusing at school, frequently lapse of memory, word finding difficulties, and of depression anxiety  Differentiation diagnosis including ADHD, mood disorder related  EEG was normal, he denies benefit taking Depakote DR 500 mg every night. I have suggested he stop Depakote Cerebral small vessel disease  He does have vascular risk factor of aging, hypertension, hyperlipidemia, obesity, obstructive sleep apnea  I have suggested him increase water intake, baby aspirin 81 mg daily  Ultrasound of carotid artery  Echocardiogram to complete evaluations  Levert Feinstein, M.D. Ph.D.  [facility] [address], [address] [place], [place] [address] Ph: [phone] Fax: [phone]  CC: Jonathon Bellows, NP, Pearson Grippe, MD

## 2019-10-23 ENCOUNTER — Ambulatory Visit (HOSPITAL_BASED_OUTPATIENT_CLINIC_OR_DEPARTMENT_OTHER)
Admission: RE | Admit: 2019-10-23 | Discharge: 2019-10-23 | Disposition: A | Discharge status: Home / Self Care | Payer: Medicare Other | Source: Ambulatory Visit | Attending: Neurology | Admitting: Neurology

## 2019-10-23 ENCOUNTER — Telehealth: Payer: Self-pay | Admitting: Neurology

## 2019-10-23 ENCOUNTER — Other Ambulatory Visit: Payer: Self-pay

## 2019-10-23 ENCOUNTER — Ambulatory Visit (HOSPITAL_COMMUNITY)
Admission: RE | Admit: 2019-10-23 | Discharge: 2019-10-23 | Disposition: A | Discharge status: Home / Self Care | Payer: Medicare Other | Source: Ambulatory Visit | Attending: Neurology | Admitting: Neurology

## 2019-10-23 DIAGNOSIS — I679 Cerebrovascular disease, unspecified: Secondary | ICD-10-CM | POA: Insufficient documentation

## 2019-10-23 DIAGNOSIS — E785 Hyperlipidemia, unspecified: Secondary | ICD-10-CM | POA: Insufficient documentation

## 2019-10-23 DIAGNOSIS — I351 Nonrheumatic aortic (valve) insufficiency: Secondary | ICD-10-CM | POA: Diagnosis not present

## 2019-10-23 DIAGNOSIS — I10 Essential (primary) hypertension: Secondary | ICD-10-CM | POA: Diagnosis not present

## 2019-10-23 NOTE — Progress Notes (Signed)
  Echocardiogram 2D Echocardiogram has been performed.  John [name] 10/23/2019, 11:40 AM

## 2019-10-23 NOTE — Telephone Encounter (Signed)
Called the patient, there was no answer. Left a detailed message advising the pt that Dr Terrace Arabia reviewed the [place] results and there was < 39% stenosis on both side of the neck, overall looking good and not requiring any treatment plan changes. Instructed the patient to call back with any questions he may have.

## 2019-10-23 NOTE — Telephone Encounter (Signed)
Right Carotid: Velocities in the right ICA are consistent with a 1-39%  stenosis.   Left Carotid: Velocities in the left ICA are consistent with a 1-39%  stenosis.   Please call patient, ultrasound of carotid arteries showed less than 39% stenosis bilaterally, no change in treatment plan.

## 2019-10-24 NOTE — Telephone Encounter (Signed)
Called the patient review the carotid US. Advised of the results. The patient asked about the Echo. I looked at that and Dr Terrace Arabia did also review the Echo and states there was no abnormalities noted. Pt verbalized understanding. Pt had no questions at this time but was encouraged to call back if questions arise.

## 2019-11-21 DIAGNOSIS — L237 Allergic contact dermatitis due to plants, except food: Secondary | ICD-10-CM | POA: Diagnosis not present

## 2019-11-21 DIAGNOSIS — R05 Cough: Secondary | ICD-10-CM | POA: Diagnosis not present

## 2019-12-13 DIAGNOSIS — F3342 Major depressive disorder, recurrent, in full remission: Secondary | ICD-10-CM | POA: Diagnosis not present

## 2019-12-13 DIAGNOSIS — F411 Generalized anxiety disorder: Secondary | ICD-10-CM | POA: Diagnosis not present

## 2019-12-13 DIAGNOSIS — F900 Attention-deficit hyperactivity disorder, predominantly inattentive type: Secondary | ICD-10-CM | POA: Diagnosis not present

## 2019-12-13 DIAGNOSIS — G47419 Narcolepsy without cataplexy: Secondary | ICD-10-CM | POA: Diagnosis not present

## 2020-02-01 DIAGNOSIS — Z23 Encounter for immunization: Secondary | ICD-10-CM | POA: Diagnosis not present

## 2020-03-07 DIAGNOSIS — Z03818 Encounter for observation for suspected exposure to other biological agents ruled out: Secondary | ICD-10-CM | POA: Diagnosis not present

## 2020-03-07 DIAGNOSIS — Z20822 Contact with and (suspected) exposure to covid-19: Secondary | ICD-10-CM | POA: Diagnosis not present

## 2020-03-20 DIAGNOSIS — F3342 Major depressive disorder, recurrent, in full remission: Secondary | ICD-10-CM | POA: Diagnosis not present

## 2020-03-20 DIAGNOSIS — F411 Generalized anxiety disorder: Secondary | ICD-10-CM | POA: Diagnosis not present

## 2020-03-20 DIAGNOSIS — F331 Major depressive disorder, recurrent, moderate: Secondary | ICD-10-CM | POA: Diagnosis not present

## 2020-03-20 DIAGNOSIS — G47419 Narcolepsy without cataplexy: Secondary | ICD-10-CM | POA: Diagnosis not present

## 2020-03-20 DIAGNOSIS — F900 Attention-deficit hyperactivity disorder, predominantly inattentive type: Secondary | ICD-10-CM | POA: Diagnosis not present

## 2020-04-01 DIAGNOSIS — I10 Essential (primary) hypertension: Secondary | ICD-10-CM | POA: Diagnosis not present

## 2020-04-01 DIAGNOSIS — R739 Hyperglycemia, unspecified: Secondary | ICD-10-CM | POA: Diagnosis not present

## 2020-04-01 DIAGNOSIS — E785 Hyperlipidemia, unspecified: Secondary | ICD-10-CM | POA: Diagnosis not present

## 2020-04-01 DIAGNOSIS — Z125 Encounter for screening for malignant neoplasm of prostate: Secondary | ICD-10-CM | POA: Diagnosis not present

## 2020-04-08 DIAGNOSIS — G4733 Obstructive sleep apnea (adult) (pediatric): Secondary | ICD-10-CM | POA: Diagnosis not present

## 2020-04-08 DIAGNOSIS — L4050 Arthropathic psoriasis, unspecified: Secondary | ICD-10-CM | POA: Diagnosis not present

## 2020-04-08 DIAGNOSIS — E291 Testicular hypofunction: Secondary | ICD-10-CM | POA: Diagnosis not present

## 2020-04-08 DIAGNOSIS — I10 Essential (primary) hypertension: Secondary | ICD-10-CM | POA: Diagnosis not present

## 2020-04-08 DIAGNOSIS — E870 Hyperosmolality and hypernatremia: Secondary | ICD-10-CM | POA: Diagnosis not present

## 2020-04-08 DIAGNOSIS — Z0000 Encounter for general adult medical examination without abnormal findings: Secondary | ICD-10-CM | POA: Diagnosis not present

## 2020-04-08 DIAGNOSIS — E785 Hyperlipidemia, unspecified: Secondary | ICD-10-CM | POA: Diagnosis not present

## 2020-04-08 DIAGNOSIS — F329 Major depressive disorder, single episode, unspecified: Secondary | ICD-10-CM | POA: Diagnosis not present

## 2020-04-08 DIAGNOSIS — R739 Hyperglycemia, unspecified: Secondary | ICD-10-CM | POA: Diagnosis not present

## 2020-04-24 DIAGNOSIS — E870 Hyperosmolality and hypernatremia: Secondary | ICD-10-CM | POA: Diagnosis not present

## 2020-05-08 DIAGNOSIS — G47419 Narcolepsy without cataplexy: Secondary | ICD-10-CM | POA: Diagnosis not present

## 2020-05-08 DIAGNOSIS — F3342 Major depressive disorder, recurrent, in full remission: Secondary | ICD-10-CM | POA: Diagnosis not present

## 2020-05-08 DIAGNOSIS — F411 Generalized anxiety disorder: Secondary | ICD-10-CM | POA: Diagnosis not present

## 2020-05-08 DIAGNOSIS — F900 Attention-deficit hyperactivity disorder, predominantly inattentive type: Secondary | ICD-10-CM | POA: Diagnosis not present

## 2020-05-09 ENCOUNTER — Other Ambulatory Visit: Payer: Medicare Other

## 2020-05-09 DIAGNOSIS — Z03818 Encounter for observation for suspected exposure to other biological agents ruled out: Secondary | ICD-10-CM | POA: Diagnosis not present

## 2020-05-09 DIAGNOSIS — Z20822 Contact with and (suspected) exposure to covid-19: Secondary | ICD-10-CM | POA: Diagnosis not present

## 2020-05-29 DIAGNOSIS — G47419 Narcolepsy without cataplexy: Secondary | ICD-10-CM | POA: Diagnosis not present

## 2020-05-29 DIAGNOSIS — F3342 Major depressive disorder, recurrent, in full remission: Secondary | ICD-10-CM | POA: Diagnosis not present

## 2020-05-29 DIAGNOSIS — F900 Attention-deficit hyperactivity disorder, predominantly inattentive type: Secondary | ICD-10-CM | POA: Diagnosis not present

## 2020-05-29 DIAGNOSIS — F411 Generalized anxiety disorder: Secondary | ICD-10-CM | POA: Diagnosis not present

## 2020-06-05 DIAGNOSIS — F900 Attention-deficit hyperactivity disorder, predominantly inattentive type: Secondary | ICD-10-CM | POA: Diagnosis not present

## 2020-06-05 DIAGNOSIS — F411 Generalized anxiety disorder: Secondary | ICD-10-CM | POA: Diagnosis not present

## 2020-06-05 DIAGNOSIS — F3342 Major depressive disorder, recurrent, in full remission: Secondary | ICD-10-CM | POA: Diagnosis not present

## 2020-06-05 DIAGNOSIS — G47419 Narcolepsy without cataplexy: Secondary | ICD-10-CM | POA: Diagnosis not present

## 2020-07-16 DIAGNOSIS — F411 Generalized anxiety disorder: Secondary | ICD-10-CM | POA: Diagnosis not present

## 2020-07-16 DIAGNOSIS — G47419 Narcolepsy without cataplexy: Secondary | ICD-10-CM | POA: Diagnosis not present

## 2020-07-16 DIAGNOSIS — F900 Attention-deficit hyperactivity disorder, predominantly inattentive type: Secondary | ICD-10-CM | POA: Diagnosis not present

## 2020-07-16 DIAGNOSIS — F3342 Major depressive disorder, recurrent, in full remission: Secondary | ICD-10-CM | POA: Diagnosis not present

## 2020-08-11 DIAGNOSIS — F900 Attention-deficit hyperactivity disorder, predominantly inattentive type: Secondary | ICD-10-CM | POA: Diagnosis not present

## 2020-08-11 DIAGNOSIS — F3342 Major depressive disorder, recurrent, in full remission: Secondary | ICD-10-CM | POA: Diagnosis not present

## 2020-08-11 DIAGNOSIS — F411 Generalized anxiety disorder: Secondary | ICD-10-CM | POA: Diagnosis not present

## 2020-08-11 DIAGNOSIS — G47419 Narcolepsy without cataplexy: Secondary | ICD-10-CM | POA: Diagnosis not present

## 2020-08-15 DIAGNOSIS — G47419 Narcolepsy without cataplexy: Secondary | ICD-10-CM | POA: Diagnosis not present

## 2020-08-15 DIAGNOSIS — F3342 Major depressive disorder, recurrent, in full remission: Secondary | ICD-10-CM | POA: Diagnosis not present

## 2020-08-15 DIAGNOSIS — F411 Generalized anxiety disorder: Secondary | ICD-10-CM | POA: Diagnosis not present

## 2020-08-15 DIAGNOSIS — F900 Attention-deficit hyperactivity disorder, predominantly inattentive type: Secondary | ICD-10-CM | POA: Diagnosis not present

## 2020-09-10 DIAGNOSIS — R739 Hyperglycemia, unspecified: Secondary | ICD-10-CM | POA: Diagnosis not present

## 2020-09-10 DIAGNOSIS — Z125 Encounter for screening for malignant neoplasm of prostate: Secondary | ICD-10-CM | POA: Diagnosis not present

## 2020-09-10 DIAGNOSIS — R21 Rash and other nonspecific skin eruption: Secondary | ICD-10-CM | POA: Diagnosis not present

## 2020-09-10 DIAGNOSIS — E785 Hyperlipidemia, unspecified: Secondary | ICD-10-CM | POA: Diagnosis not present

## 2020-09-10 DIAGNOSIS — R7989 Other specified abnormal findings of blood chemistry: Secondary | ICD-10-CM | POA: Diagnosis not present

## 2020-09-10 DIAGNOSIS — Z0000 Encounter for general adult medical examination without abnormal findings: Secondary | ICD-10-CM | POA: Diagnosis not present

## 2020-09-10 DIAGNOSIS — I10 Essential (primary) hypertension: Secondary | ICD-10-CM | POA: Diagnosis not present

## 2020-09-16 DIAGNOSIS — I10 Essential (primary) hypertension: Secondary | ICD-10-CM | POA: Diagnosis not present

## 2020-09-16 DIAGNOSIS — Z125 Encounter for screening for malignant neoplasm of prostate: Secondary | ICD-10-CM | POA: Diagnosis not present

## 2020-09-16 DIAGNOSIS — R739 Hyperglycemia, unspecified: Secondary | ICD-10-CM | POA: Diagnosis not present

## 2020-09-16 DIAGNOSIS — Z0000 Encounter for general adult medical examination without abnormal findings: Secondary | ICD-10-CM | POA: Diagnosis not present

## 2020-09-16 DIAGNOSIS — E785 Hyperlipidemia, unspecified: Secondary | ICD-10-CM | POA: Diagnosis not present

## 2020-09-16 DIAGNOSIS — R7989 Other specified abnormal findings of blood chemistry: Secondary | ICD-10-CM | POA: Diagnosis not present

## 2020-09-22 DIAGNOSIS — Z862 Personal history of diseases of the blood and blood-forming organs and certain disorders involving the immune mechanism: Secondary | ICD-10-CM | POA: Diagnosis not present

## 2020-09-22 DIAGNOSIS — F329 Major depressive disorder, single episode, unspecified: Secondary | ICD-10-CM | POA: Diagnosis not present

## 2020-09-22 DIAGNOSIS — E785 Hyperlipidemia, unspecified: Secondary | ICD-10-CM | POA: Diagnosis not present

## 2020-09-22 DIAGNOSIS — R21 Rash and other nonspecific skin eruption: Secondary | ICD-10-CM | POA: Diagnosis not present

## 2020-09-22 DIAGNOSIS — M25511 Pain in right shoulder: Secondary | ICD-10-CM | POA: Diagnosis not present

## 2020-09-22 DIAGNOSIS — I10 Essential (primary) hypertension: Secondary | ICD-10-CM | POA: Diagnosis not present

## 2020-10-15 DIAGNOSIS — N452 Orchitis: Secondary | ICD-10-CM | POA: Diagnosis not present

## 2020-10-15 DIAGNOSIS — R053 Chronic cough: Secondary | ICD-10-CM | POA: Diagnosis not present

## 2020-10-15 DIAGNOSIS — J069 Acute upper respiratory infection, unspecified: Secondary | ICD-10-CM | POA: Diagnosis not present

## 2020-12-24 DIAGNOSIS — M160 Bilateral primary osteoarthritis of hip: Secondary | ICD-10-CM | POA: Diagnosis not present

## 2020-12-24 DIAGNOSIS — M5416 Radiculopathy, lumbar region: Secondary | ICD-10-CM | POA: Diagnosis not present

## 2020-12-25 DIAGNOSIS — G47419 Narcolepsy without cataplexy: Secondary | ICD-10-CM | POA: Diagnosis not present

## 2020-12-25 DIAGNOSIS — F900 Attention-deficit hyperactivity disorder, predominantly inattentive type: Secondary | ICD-10-CM | POA: Diagnosis not present

## 2020-12-25 DIAGNOSIS — F3342 Major depressive disorder, recurrent, in full remission: Secondary | ICD-10-CM | POA: Diagnosis not present

## 2020-12-25 DIAGNOSIS — F411 Generalized anxiety disorder: Secondary | ICD-10-CM | POA: Diagnosis not present

## 2021-01-27 DIAGNOSIS — Z23 Encounter for immunization: Secondary | ICD-10-CM | POA: Diagnosis not present

## 2021-02-06 DIAGNOSIS — M545 Low back pain, unspecified: Secondary | ICD-10-CM | POA: Diagnosis not present

## 2021-02-12 DIAGNOSIS — M4316 Spondylolisthesis, lumbar region: Secondary | ICD-10-CM | POA: Diagnosis not present

## 2021-02-26 DIAGNOSIS — F3342 Major depressive disorder, recurrent, in full remission: Secondary | ICD-10-CM | POA: Diagnosis not present

## 2021-02-26 DIAGNOSIS — F900 Attention-deficit hyperactivity disorder, predominantly inattentive type: Secondary | ICD-10-CM | POA: Diagnosis not present

## 2021-02-26 DIAGNOSIS — G47419 Narcolepsy without cataplexy: Secondary | ICD-10-CM | POA: Diagnosis not present

## 2021-02-26 DIAGNOSIS — F411 Generalized anxiety disorder: Secondary | ICD-10-CM | POA: Diagnosis not present

## 2021-03-19 DIAGNOSIS — M5416 Radiculopathy, lumbar region: Secondary | ICD-10-CM | POA: Diagnosis not present

## 2021-03-20 DIAGNOSIS — F900 Attention-deficit hyperactivity disorder, predominantly inattentive type: Secondary | ICD-10-CM | POA: Diagnosis not present

## 2021-03-20 DIAGNOSIS — F3342 Major depressive disorder, recurrent, in full remission: Secondary | ICD-10-CM | POA: Diagnosis not present

## 2021-03-20 DIAGNOSIS — F411 Generalized anxiety disorder: Secondary | ICD-10-CM | POA: Diagnosis not present

## 2021-03-20 DIAGNOSIS — G47419 Narcolepsy without cataplexy: Secondary | ICD-10-CM | POA: Diagnosis not present

## 2021-04-02 DIAGNOSIS — Z862 Personal history of diseases of the blood and blood-forming organs and certain disorders involving the immune mechanism: Secondary | ICD-10-CM | POA: Diagnosis not present

## 2021-04-02 DIAGNOSIS — I10 Essential (primary) hypertension: Secondary | ICD-10-CM | POA: Diagnosis not present

## 2021-04-02 DIAGNOSIS — E785 Hyperlipidemia, unspecified: Secondary | ICD-10-CM | POA: Diagnosis not present

## 2021-04-09 DIAGNOSIS — Z862 Personal history of diseases of the blood and blood-forming organs and certain disorders involving the immune mechanism: Secondary | ICD-10-CM | POA: Diagnosis not present

## 2021-04-09 DIAGNOSIS — Z0000 Encounter for general adult medical examination without abnormal findings: Secondary | ICD-10-CM | POA: Diagnosis not present

## 2021-04-09 DIAGNOSIS — M4316 Spondylolisthesis, lumbar region: Secondary | ICD-10-CM | POA: Diagnosis not present

## 2021-04-09 DIAGNOSIS — R350 Frequency of micturition: Secondary | ICD-10-CM | POA: Diagnosis not present

## 2021-04-09 DIAGNOSIS — G4733 Obstructive sleep apnea (adult) (pediatric): Secondary | ICD-10-CM | POA: Diagnosis not present

## 2021-04-09 DIAGNOSIS — I10 Essential (primary) hypertension: Secondary | ICD-10-CM | POA: Diagnosis not present

## 2021-04-09 DIAGNOSIS — F329 Major depressive disorder, single episode, unspecified: Secondary | ICD-10-CM | POA: Diagnosis not present

## 2021-04-09 DIAGNOSIS — E785 Hyperlipidemia, unspecified: Secondary | ICD-10-CM | POA: Diagnosis not present

## 2021-05-11 DIAGNOSIS — Z125 Encounter for screening for malignant neoplasm of prostate: Secondary | ICD-10-CM | POA: Diagnosis not present

## 2021-05-11 DIAGNOSIS — R739 Hyperglycemia, unspecified: Secondary | ICD-10-CM | POA: Diagnosis not present

## 2021-05-11 DIAGNOSIS — R350 Frequency of micturition: Secondary | ICD-10-CM | POA: Diagnosis not present

## 2021-05-11 DIAGNOSIS — E785 Hyperlipidemia, unspecified: Secondary | ICD-10-CM | POA: Diagnosis not present

## 2021-05-11 DIAGNOSIS — I10 Essential (primary) hypertension: Secondary | ICD-10-CM | POA: Diagnosis not present

## 2021-05-11 DIAGNOSIS — R7989 Other specified abnormal findings of blood chemistry: Secondary | ICD-10-CM | POA: Diagnosis not present

## 2021-06-01 DIAGNOSIS — F3342 Major depressive disorder, recurrent, in full remission: Secondary | ICD-10-CM | POA: Diagnosis not present

## 2021-06-01 DIAGNOSIS — F411 Generalized anxiety disorder: Secondary | ICD-10-CM | POA: Diagnosis not present

## 2021-06-01 DIAGNOSIS — G47419 Narcolepsy without cataplexy: Secondary | ICD-10-CM | POA: Diagnosis not present

## 2021-06-01 DIAGNOSIS — F900 Attention-deficit hyperactivity disorder, predominantly inattentive type: Secondary | ICD-10-CM | POA: Diagnosis not present

## 2021-07-01 ENCOUNTER — Encounter: Payer: Self-pay | Admitting: Neurology

## 2021-07-01 ENCOUNTER — Other Ambulatory Visit: Payer: Self-pay

## 2021-07-01 ENCOUNTER — Ambulatory Visit (INDEPENDENT_AMBULATORY_CARE_PROVIDER_SITE_OTHER): Payer: Medicare Other | Admitting: Neurology

## 2021-07-01 VITALS — BP 143/63 | HR 57 | Ht 66.0 in | Wt 247.5 lb

## 2021-07-01 DIAGNOSIS — Z9189 Other specified personal risk factors, not elsewhere classified: Secondary | ICD-10-CM | POA: Diagnosis not present

## 2021-07-01 DIAGNOSIS — Z9989 Dependence on other enabling machines and devices: Secondary | ICD-10-CM | POA: Diagnosis not present

## 2021-07-01 DIAGNOSIS — G4733 Obstructive sleep apnea (adult) (pediatric): Secondary | ICD-10-CM | POA: Insufficient documentation

## 2021-07-01 NOTE — Patient Instructions (Signed)
Sleep Apnea ?Sleep apnea affects breathing during sleep. It causes breathing to stop for 10 seconds or more, or to become shallow. People with sleep apnea usually snore loudly. ?It can also increase the risk of: ?Heart attack. ?Stroke. ?Being very overweight (obese). ?Diabetes. ?Heart failure. ?Irregular heartbeat. ?High blood pressure. ?The goal of treatment is to help you breathe normally again. ?What are the causes? ?The most common cause of this condition is a collapsed or blocked airway. ?There are three kinds of sleep apnea: ?Obstructive sleep apnea. This is caused by a blocked or collapsed airway. ?Central sleep apnea. This happens when the brain does not send the right signals to the muscles that control breathing. ?Mixed sleep apnea. This is a combination of obstructive and central sleep apnea. ?What increases the risk? ?Being overweight. ?Smoking. ?Having a small airway. ?Being older. ?Being male. ?Drinking alcohol. ?Taking medicines to calm yourself (sedatives or tranquilizers). ?Having family members with the condition. ?Having a tongue or tonsils that are larger than normal. ?What are the signs or symptoms? ?Trouble staying asleep. ?Loud snoring. ?Headaches in the morning. ?Waking up gasping. ?Dry mouth or sore throat in the morning. ?Being sleepy or tired during the day. ?If you are sleepy or tired during the day, you may also: ?Not be able to focus your mind (concentrate). ?Forget things. ?Get angry a lot and have mood swings. ?Feel sad (depressed). ?Have changes in your personality. ?Have less interest in sex, if you are male. ?Be unable to have an erection, if you are male. ?How is this treated? ? ?Sleeping on your side. ?Using a medicine to get rid of mucus in your nose (decongestant). ?Avoiding the use of alcohol, medicines to help you relax, or certain pain medicines (narcotics). ?Losing weight, if needed. ?Changing your diet. ?Quitting smoking. ?Using a machine to open your airway while you  sleep, such as: ?An oral appliance. This is a mouthpiece that shifts your lower jaw forward. ?A CPAP device. This device blows air through a mask when you breathe out (exhale). ?An EPAP device. This has valves that you put in each nostril. ?A BIPAP device. This device blows air through a mask when you breathe in (inhale) and breathe out. ?Having surgery if other treatments do not work. ?Follow these instructions at home: ?Lifestyle ?Make changes that your doctor recommends. ?Eat a healthy diet. ?Lose weight if needed. ?Avoid alcohol, medicines to help you relax, and some pain medicines. ?Do not smoke or use any products that contain nicotine or tobacco. If you need help quitting, ask your doctor. ?General instructions ?Take over-the-counter and prescription medicines only as told by your doctor. ?If you were given a machine to use while you sleep, use it only as told by your doctor. ?If you are having surgery, make sure to tell your doctor you have sleep apnea. You may need to bring your device with you. ?Keep all follow-up visits. ?Contact a doctor if: ?The machine that you were given to use during sleep bothers you or does not seem to be working. ?You do not get better. ?You get worse. ?Get help right away if: ?Your chest hurts. ?You have trouble breathing in enough air. ?You have an uncomfortable feeling in your back, arms, or stomach. ?You have trouble talking. ?One side of your body feels weak. ?A part of your face is hanging down. ?These symptoms may be an emergency. Get help right away. Call your local emergency services (911 in the [place] ?Do not wait to see if the symptoms   will go away. ?Do not drive yourself to the hospital. ?Summary ?This condition affects breathing during sleep. ?The most common cause is a collapsed or blocked airway. ?The goal of treatment is to help you breathe normally while you sleep. ?This information is not intended to replace advice given to you by your health care provider. Make  sure you discuss any questions you have with your health care provider. ?Document Revised: 11/05/2020 Document Reviewed: 03/07/2020 ?Elsevier Patient Education ? 2022 [employer] ?Quality Sleep Information, Adult ?Quality sleep is important for your mental and physical health. It also improves your quality of life. Quality sleep means you: ?Are asleep for most of the time you are in bed. ?Fall asleep within 30 minutes. ?Wake up no more than once a night.  ?Are awake for no longer than 20 minutes if you do wake up during the night. ?Most adults need 7-8 hours of quality sleep each night. ?How can poor sleep affect me? ?If you do not get enough quality sleep, you may have: ?Mood swings. ?Daytime sleepiness. ?Confusion. ?Decreased reaction time. ?Sleep disorders, such as insomnia and sleep apnea. ?Difficulty with: ?Solving problems. ?Coping with stress. ?Paying attention. ?These issues may affect your performance and productivity at work, school, and at home. Lack of sleep may also put you at higher risk for accidents, suicide, and risky behaviors. ?If you do not get quality sleep you may also be at higher risk for several health problems, including: ?Infections. ?Type 2 diabetes. ?Heart disease. ?High blood pressure. ?Obesity. ?Worsening of long-term conditions, like arthritis, kidney disease, depression, Parkinson's disease, and epilepsy. ?What actions can I take to get more quality sleep? ?  ?Stick to a sleep schedule. Go to sleep and wake up at about the same time each day. Do not try to sleep less on weekdays and make up for lost sleep on weekends. This does not work. ?Try to get about 30 minutes of exercise on most days. Do not exercise 2-3 hours before going to bed. ?Limit naps during the day to 30 minutes or less. ?Do not use any products that contain nicotine or tobacco, such as cigarettes or e-cigarettes. If you need help quitting, ask your health care provider. ?Do not drink caffeinated beverages for at  least 8 hours before going to bed. Coffee, tea, and some sodas contain caffeine. ?Do not drink alcohol close to bedtime. ?Do not eat large meals close to bedtime. ?Do not take naps in the late afternoon. ?Try to get at least 30 minutes of sunlight every day. Morning sunlight is best. ?Make time to relax before bed. Reading, listening to music, or taking a hot bath promotes quality sleep. ?Make your bedroom a place that promotes quality sleep. Keep your bedroom dark, quiet, and at a comfortable room temperature. Make sure your bed is comfortable. Take out sleep distractions like TV, a computer, smartphone, and bright lights. ?If you are lying awake in bed for longer than 20 minutes, get up and do a relaxing activity until you feel sleepy. ?Work with your health care provider to treat medical conditions that may affect sleeping, such as: ?Nasal obstruction. ?Snoring. ?Sleep apnea and other sleep disorders. ?Talk to your health care provider if you think any of your prescription medicines may cause you to have difficulty falling or staying asleep. ?If you have sleep problems, talk with a sleep consultant. If you think you have a sleep disorder, talk with your health care provider about getting evaluated by a specialist. ?Where to find more   information ?National Sleep Foundation website: https://sleepfoundation.org ?National Heart, Lung, and Blood Institute (NHLBI): www.nhlbi.nih.gov/files/docs/public/sleep/healthy_sleep.pdf ?Centers for Disease Control and Prevention (CDC): www.cdc.gov/sleep/index.html ?Contact a health care provider if you: ?Have trouble getting to sleep or staying asleep. ?Often wake up very early in the morning and cannot get back to sleep. ?Have daytime sleepiness. ?Have daytime sleep attacks of suddenly falling asleep and sudden muscle weakness (narcolepsy). ?Have a tingling sensation in your legs with a strong urge to move your legs (restless legs syndrome). ?Stop breathing briefly during sleep  (sleep apnea). ?Think you have a sleep disorder or are taking a medicine that is affecting your quality of sleep. ?Summary ?Most adults need 7-8 hours of quality sleep each night. ?Getting enough quality s

## 2021-07-01 NOTE — Progress Notes (Signed)
? ? ? ?SLEEP MEDICINE CLINIC ?  ? ?Provider:  Melvyn Novas, MD  ?Primary Care Physician:  Irena Reichmann, DO ?[address] ?[place] [address]  ? ?  ?Referring Provider: Irena Reichmann, Do ?[address] ?[address] ?[place],  [place] [address]  ?  ?  ?    ?Chief Complaint according to patient   ?Patient presents with:  ?  ? New Patient (Initial Visit)  ?     ?  ?  ?HISTORY OF PRESENT ILLNESS:  ?John [name] is a 66 y.o. Caucasian male patient of Dr Terrace Arabia , who has seen him for memory loss but referred today seen upon referral by Irena Reichmann, DO, on 07/01/2021 .he arrived late and has a paper external information, This consult started at 9.15 AM, arrival time was 8.30.  ?Chief concern according to patient :  needing CPAP. ?  ? John [name]  has a past medical history of CVA- Stroke ( Dr Terrace Arabia 10-2019), Depression, Hyperlipemia, Hypertension,OSA on CPAP and Psoriatic arthritis (HCC). ?  ?The patient had the first sleep study in the year 2010, and has been on CPAP since. [facility] in [place]. Last machine st up 07-09-2016. 66 years old.  ?  ?Sleep relevant medical history: Nocturia before CPAP- , Tonsillectomy in childhood, cervical spine DDD, seasonal allergic reactions.   ? Family medical /sleep history: no other family member on CPAP with OSA, sister is a loud snorer. ?  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   ?Pets are present. 2 dogs.  ?Tobacco use- none.  ? ETOH use ; socially , 2 a month/ on cruises,  ?Caffeine intake in form of Coffee( /) Soda( 2/ day) Tea ( /) nor energy drinks. ?Regular exercise in form of -none.   ? ?  ?Sleep habits are as follows:  ?The patient's dinner time is between 3 PM, and he snacks later. The patient goes to bed at 10-11 PM and continues to sleep for 5-7 hours, wakes for one bathroom break,.   ?The preferred sleep position is laterally, has nose pillows. Sometimes supine , with the support of 3  pillows. Dreams are reportedly frequent/vivid.  ?9 AM is the usual rise time. The patient wakes up spontaneously.  ?He reports not feeling refreshed or restored in AM, ?On CPAP without symptoms such as dry mouth, morning headaches, and residual fatigue.  ?Naps are taken infrequently, often while  TV is watching him-lasting from 15 to 60 minutes . ?  ?Review of Systems: ?Out of a complete 14 system review, the patient complains of only the following symptoms, and all other reviewed systems are negative.:  ?Fatigue, sleepiness , snoring without CPAP only.  ?  ?How likely are you to doze in the following situations: ?0 = not likely, 1 = slight chance, 2 = moderate chance, 3 = high chance ?  ?Sitting and Reading? ?Watching Television? ?Sitting inactive in a public place (theater or meeting)? ?As a passenger in a car for an hour without a break? ?Lying down in the afternoon when circumstances permit? ?Sitting and talking to someone? ?Sitting quietly after lunch without alcohol? ?In a car, while stopped for a few minutes in traffic? ?  ?Total = 12/ 24 points  ? FSS endorsed at na/ 63 points.  ? ?GDS : n/a  ? ? ?TEST : Mr. ?His last sleep study was approximately 10 years ago arranged to [facility]  at [facility] [place].  His referral does not contain any of the previous sleep study but he brought his machine to today's visit and he was referred with a extensive laboratory record.  According to his referring physician used to be Rosanne Ashing can he has been suffering from some fatigue even while being on CPAP and at one time carries a diagnosis of attention deficit hyperactivity disorder.  He was treated for hypertension, he was tested for testosterone deficiency vitamin D and vitamin B12.  He has been on CPAP for over a decade but his current machine is just 66 years old and needs to be replaced there is no problem with the current machine it is still functioning at this time but the software will for  no longer be updated and there could be problems in the near future.  For the last 3 or 4 years he did not report he had reported urinary frequency but this has been controlled by medication and was not is CPAP dependent finding. ? ?He is 100% compliant with his current ResMed Airview machine and AirSense 10 AutoSet serial #[id number] for 6.  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  Current residual AHI is 5.2/h which is sufficient if his baseline apnea was severe.  The residual apneas are more obstructive than central in origin.  95th percentile pressure is 12.8 and his ear leakage with the current nasal pillow is mild at 14.9 L/min.  His Epworth sleepiness score is endorsed at 12 points.  He has been on armodafinil the generic form of Nuvigil at 250 mg a day he is also on Wellbutrin, Prozac.  ? ? ?Social History  ? ?Socioeconomic History  ? Marital status: Married  ?  Spouse name: Larita Fife  ? Number of children: 1  ? Years of education: 108  ? Highest education level: High school graduate  ?Occupational History  ? Occupation: Retired  ?Tobacco Use  ? Smoking status: Former  ?  Types: Cigarettes  ?  Quit date: 07/2000  ?  Years since quitting: 20.9  ? Smokeless tobacco: Never  ?Substance and Sexual Activity  ? Alcohol use: Yes  ?  Comment: one drink every two months  ? Drug use: Never  ? Sexual activity: Not Currently  ?Other Topics Concern  ? Not on file  ?Social History Narrative  ? Lives with wife.  ? Right-handed.  ? No daily caffeine use  ? ?Social Determinants of Health  ? ?Financial Resource Strain: Not on file  ?Food Insecurity: Not on file  ?Transportation Needs: Not on file  ?Physical Activity: Not on file  ?Stress: Not on file  ?Social Connections: Not on file  ? ? ?Family History  ?Problem Relation Age of Onset  ? COPD Mother   ?     died at 58  ? Alzheimer's disease Mother   ? Diabetes Father   ?      died at 11  ? Alzheimer's disease Father   ? Early death Paternal Aunt   ? ? ?Past Medical History:  ?Diagnosis Date  ? Depression   ? Hyperlipemia   ? Hypertension   ? Memory loss   ? Psoriatic arthritis (HCC)   ? ? ?Past Surgical History:  ?Procedure Laterality Date  ? TONSILLECTOMY    ? VIDEO ASSISTED THORACOSCOPY (VATS)/EMPYEMA Right 05/13/2016  ? Procedure: VIDEO  ASSISTED THORACOSCOPY (VATS)/DRAIN EMPYEMA;  Surgeon: Alleen BorneBryan K Bartle, MD;  Location: [facility] OR;  Service: Thoracic;  Laterality: Right;  ?  ? ?Current Outpatient Medications on File Prior to Visit  ?Medication Sig Dispense Refill  ? aspirin 81 MG chewable tablet Chew 1 tablet by mouth daily.    ? buPROPion (WELLBUTRIN XL) 300 MG 24 hr tablet Take 300 mg by mouth daily.    ? carvedilol (COREG) 3.125 MG tablet Take 3.125 mg by mouth 2 (two) times daily with a meal.    ? Cinnamon 500 MG capsule Take 500 mg by mouth daily.    ? fish oil-omega-3 fatty acids 1000 MG capsule Take 2 g by mouth in the morning and at bedtime.     ? folic acid (FOLVITE) 1 MG tablet Take 1 mg by mouth daily.    ? gabapentin (NEURONTIN) 300 MG capsule Take 300 mg by mouth at bedtime.    ? Ginkgo 60 MG TABS Take 1 tablet by mouth daily.    ? GINSENG EXTRACT PO Take 400 mg by mouth daily.    ? Glucosamine 500 MG CAPS 1 tablet    ? Glucosamine HCl (GLUCOSAMINE PO) Take 500 mg by mouth daily.    ? losartan (COZAAR) 25 MG tablet Take 25 mg by mouth daily.    ? modafinil (PROVIGIL) 200 MG tablet Take 200 mg by mouth daily.    ? Multiple Minerals (CALCIUM-MAGNESIUM-ZINC) TABS Take 1 tablet by mouth daily.    ? Multiple Vitamin (MULTIVITAMIN) capsule Take 1 capsule by mouth daily.    ? SERTRALINE HCL PO Take by mouth.    ? simvastatin (ZOCOR) 10 MG tablet Take 10 mg by mouth at bedtime.    ? tamsulosin (FLOMAX) 0.4 MG CAPS capsule Take 0.4 mg by mouth daily.    ? ?No current facility-administered medications on file prior to visit.  ? ? ?Allergies  ?Allergen Reactions  ? Clindamycin/Lincomycin  Rash  ? Meropenem Rash  ? Penicillins Rash  ?  ++ pt's wife reported he tolerated keflex in the past++ ?Has patient had a PCN reaction causing immediate rash, facial/tongue/throat swelling, SOB or lightheade

## 2021-07-01 NOTE — Addendum Note (Signed)
Addended by: Melvyn Novas on: 07/01/2021 10:01 AM ? ? Modules accepted: Orders ? ?

## 2021-07-07 ENCOUNTER — Ambulatory Visit (INDEPENDENT_AMBULATORY_CARE_PROVIDER_SITE_OTHER): Payer: Medicare Other | Admitting: Neurology

## 2021-07-07 DIAGNOSIS — Z9189 Other specified personal risk factors, not elsewhere classified: Secondary | ICD-10-CM

## 2021-07-07 DIAGNOSIS — G4733 Obstructive sleep apnea (adult) (pediatric): Secondary | ICD-10-CM | POA: Diagnosis not present

## 2021-07-07 DIAGNOSIS — Z9989 Dependence on other enabling machines and devices: Secondary | ICD-10-CM

## 2021-07-10 DIAGNOSIS — Z20822 Contact with and (suspected) exposure to covid-19: Secondary | ICD-10-CM | POA: Diagnosis not present

## 2021-07-13 ENCOUNTER — Telehealth: Payer: Self-pay | Admitting: Neurology

## 2021-07-13 NOTE — Telephone Encounter (Signed)
I called pt. I advised pt that Dr. Vickey Huger reviewed their sleep study results and found that pt has sleep apnea. Dr. Vickey Huger recommends that pt starts auto CPAP. I reviewed PAP compliance expectations with the pt. Pt is agreeable to starting a CPAP. I advised pt that an order will be sent to a DME, [facility], and [facility] will call the pt within about one week after they file with the pt's insurance. [facility] will show the pt how to use the machine, fit for masks, and troubleshoot the CPAP if needed. A follow up appt was made for insurance purposes with Dr. Vickey Huger on 10/07/2021 at 9:30 am. Pt verbalized understanding to arrive 15 minutes early and bring their CPAP. A letter with all of this information in it will be mailed to the pt as a reminder. I verified with the pt that the address we have on file is correct. Pt verbalized understanding of results. Pt had no questions at this time but was encouraged to call back if questions arise. I have sent the order to [facility] and have received confirmation that they have received the order. ? ?

## 2021-07-13 NOTE — Progress Notes (Signed)
Primary Neurologist is Dr Krista Blue, patient was seen for sleep consult.  ?POLYSOMNOGRAPHY IMPRESSION?:  ?? ?1. Severe Obstructive Sleep Apnea(OSA), Hypoxemia and REM sleep void before CPAP initiation.  ?2. CPAP between 8 and 14 cm water with 3 cm EPR improved the AHI and allowed REM sleep to rebound- with REM sleep associated hypoxia. Apnea was now identified as REM sleep dependent.  ?3. CPAP at highest explored pressure still left a residual AHI of 3.3/h ( 14 cm CPAP with 3 cm EPR water). NO added oxygen was needed.  ?? ?? ?? ?RECOMMENDATIONS:  ?Mr. Hodgkiss will will be provided an autotitration CPAP device by ResMed with a pressure setting from 8 cm water through 16 cm water, with 3 cm EPR, humidification and continues to his preferred medium sized P10 by ResMed.  ?? ?Minimum use of 4 hours of CPAP therapy each night assures compliance and insurance coverage.

## 2021-07-13 NOTE — Telephone Encounter (Signed)
-----   Message from Larey Seat, MD sent at 07/13/2021  9:42 AM EDT ----- ?Primary Neurologist is Dr Krista Blue, patient was seen for sleep consult.  ?POLYSOMNOGRAPHY IMPRESSION?:  ?? ?1. Severe Obstructive Sleep Apnea(OSA), Hypoxemia and REM sleep void before CPAP initiation.  ?2. CPAP between 8 and 14 cm water with 3 cm EPR improved the AHI and allowed REM sleep to rebound- with REM sleep associated hypoxia. Apnea was now identified as REM sleep dependent.  ?3. CPAP at highest explored pressure still left a residual AHI of 3.3/h ( 14 cm CPAP with 3 cm EPR water). NO added oxygen was needed.  ?? ?? ?? ?RECOMMENDATIONS:  ?Mr. Lari will will be provided an autotitration CPAP device by ResMed with a pressure setting from 8 cm water through 16 cm water, with 3 cm EPR, humidification and continues to his preferred medium sized P10 by ResMed.  ?? ?Minimum use of 4 hours of CPAP therapy each night assures compliance and insurance coverage.  ?

## 2021-07-13 NOTE — Procedures (Signed)
PATIENT'S NAME:  John [name], John [name] ?DOB:      24-Mar-1956      ?MR#:    [id number]     ?DATE OF RECORDING: 07/07/2021 ?REFERRING M.D.:  Irena Reichmann, DO ?Study Performed:  Split-Night Titration Study ?HISTORY:  John [name] has a past medical history of CVA- Stroke (Dr Terrace Arabia 10-2019), Depression, Hyperlipemia, Hypertension, OSA on CPAP and Psoriatic arthritis (HCC). ? The patient had the first sleep study in the year 2010 and has been on CPAP therapy ever since. Study and apnea care were provided through [facility] in [place]. Last machine set up 07-09-2016 and is now 66 years old.  ?The patient endorsed the Epworth Sleepiness Scale at 12/24 points, no FSS endorsed.   ?The patient's weight 247 pounds with a height of 66 (inches), resulting in a BMI of 39.7 kg/m2. ?The patient's neck circumference measured 19 inches. ? ?CURRENT MEDICATIONS: Aspirin, Wellbutrin XL, Coreg, Cinnamon, Omega-3, Folvite, Neurontin, Ginkgo, Ginseng, Glucosamine, Cozaar, Provigil, Calcium, Sertraline, Zocor, Flomax, Multivitamin ? ?  ?PROCEDURE:  This is a multichannel digital polysomnogram utilizing the Somnostar 11.2 system.  Electrodes and sensors were applied and monitored per AASM Specifications.   EEG, EOG, Chin and Limb EMG, were sampled at 200 Hz.  ECG, Snore and Nasal Pressure, Thermal Airflow, Respiratory Effort, CPAP Flow and Pressure, Oximetry was sampled at 50 Hz. Digital video and audio were recorded.     ? ?BASELINE STUDY WITHOUT CPAP RESULTS: Lights Out was at 20:39 and Lights On at 04:59.  Total recording time (TRT) was 289, with a total sleep time (TST) of 128 minutes.   The patient's sleep latency was 109 minutes.  REM sleep was not recorded in the diagnostic part of this SPLIT study.  The sleep efficiency was 44.3 %.  ?  ?SLEEP ARCHITECTURE: WASO (Wake after sleep onset) was 108.5 minutes, Stage N1 was 25.5 minutes, Stage N2 was 102.5 minutes, Stage N3 was 0 minutes and Stage R (REM sleep) was 0 minutes.  The  percentages were Stage N1 19.9%, Stage N2 80.1%, Stage N3 0% and Stage R (REM sleep) 0%.  ? ?RESPIRATORY ANALYSIS:  There were a total of 95 respiratory events:  34 obstructive apneas, 0 central apneas and 0 mixed apneas 61 hypopneas.  ?    ?The total APNEA/HYPOPNEA INDEX (AHI) was 44.5 /hour and  all 122 events in NREM. The patient spent 240 minutes sleep time in the supine position 68 minutes in non-supine. The supine AHI was 45.0 /hour versus a non-supine AHI of 40.0 /hour. ? ?OXYGEN SATURATION & C02:  The wake baseline 02 saturation was 94%, with the lowest being 79%. Time spent below 89% saturation equaled 83 minutes. ? ?PERIODIC LIMB MOVEMENTS: The patient had a total of 0 Periodic Limb Movements.   ? ? ?EKG was in keeping with normal sinus rhythm (NSR) with isolated PVCs.  ? ? ?TITRATION STUDY WITH CPAP RESULTS: due to severe OSA the patient qualified for SPLIT night protocol and started by suing his preferred ResMED nasal pillow P10 in medium size. CPAP initiated at 6 cm water and titrated to 14cm water.  ?  ?CPAP was initiated at 6 cmH20 with heated humidity per AASM split night standards and pressure was advanced to 0/0 cmH20 due to patient's discomfort at low pressure- we started at 8 cm water, EPR of 3 cm . and increased from there because of hypopneas, apneas and desaturations -mainly in REM sleep. ?   ?At a final CPAP pressure of 14  cmH20, 3 cm EPR,  there was a reduction of the AHI to 3.3 /hour and a sleep efficiency of 95% was reached, with a recorded sleep time of 54 minutes. Hypoxia was still noted during REM sleep under 13 cm water therapeutic pressure. No REM sleep was seen under the final pressure.   ? ?Total recording time (TRT) was 211.5 minutes, with a total sleep time (TST) of 179.5 minutes. The patient's sleep latency was 8.5 minutes. REM latency was 77 minutes.  The sleep efficiency was 84.9 %.   ? ?SLEEP ARCHITECTURE: Wake after sleep was 23.5 minutes, Stage N1 17.5 minutes, Stage N2  108.5 minutes, Stage N3 0 minutes and Stage R (REM sleep) 53.5 minutes. The percentages were: Stage N1 9.7%, Stage N2 60.4%, Stage N3 0% and Stage R (REM sleep) 29.8%.  ?The sleep architecture was notable for REM sleep rebound under 8,9,11 and 13 cm water. ? ?The arousals were noted as: 44 were spontaneous, 0 were associated with PLMs, 2 were associated with respiratory events. ? ?RESPIRATORY ANALYSIS:  There were a total of 25 respiratory events: 0 obstructive apneas, 1 central apnea and 24 hypopneas. ? ?The total APNEA/HYPOPNEA INDEX (AHI) was 8.4 /h.  18 events occurred in REM sleep and 7 events in NREM.  ?The REM AHI was 20.2 /hour versus a non-REM AHI of 3.3 /hour. The patient spent 70% of total sleep time in the supine position. The supine AHI was 10.0 /hour, versus a non-supine AHI of 4.4/hour. ? ?OXYGEN SATURATION & C02:  The wake baseline 02 saturation was 90%, with the lowest being 79%. Time spent below 89% saturation equaled 36 minutes. ? ?The patient had a total of 0 Periodic Limb Movements.  ? ?POLYSOMNOGRAPHY IMPRESSION :  ? ?Severe Obstructive Sleep Apnea(OSA), Hypoxemia and REM sleep void before CPAP initiation.  ?CPAP between 8 and 14 cm water with 3 cm EPR improved the AHI and allowed REM sleep to rebound- with REM sleep associated hypoxia. Apnea was now identified as REM sleep dependent.  ?CPAP at highest explored pressure still left a residual AHI of 3.3/h ( 14 cm CPAP with 3 cm EPR water). NO added oxygen was needed.  ? ? ? ?RECOMMENDATIONS:  ?[name] is will will be provided an autotitration CPAP device by ResMed with a pressure setting from 8 cm water through 16 cm water, with 3 cm EPR, humidification and continues to his preferred medium sized P10 by ResMed.  ? ?Minimum use of 4 hours of CPAP therapy each night assures compliance and insurance coverage.  ? ?A follow up appointment will be scheduled in the Sleep Clinic at [facility].    ? ? ?I certify that I have reviewed  the entire raw data recording prior to the issuance of this report in accordance with the Standards of Accreditation of the American Academy of Sleep Medicine (AASM) ? ? ? ? ?Melvyn Novas, M.D. ?Diplomat, Biomedical engineer of Psychiatry and Neurology  ?Diplomat, Biomedical engineer of Sleep Medicine ?Medical Director, [facility] ? ? ? ? ? ?

## 2021-07-13 NOTE — Addendum Note (Signed)
Addended by: Melvyn Novas on: 07/13/2021 09:42 AM ? ? Modules accepted: Orders ? ?

## 2021-07-13 NOTE — Progress Notes (Signed)
RECOMMENDATIONS:  ?Mr. Rocks will will be provided an autotitration CPAP device by ResMed with a pressure setting from 8 cm water through 16 cm water, with 3 cm EPR, humidification and continues to his preferred medium sized P10 by ResMed.  ?? ?Minimum use of 4 hours of CPAP therapy each night assures compliance and insurance coverage.  ?

## 2021-07-14 DIAGNOSIS — M5416 Radiculopathy, lumbar region: Secondary | ICD-10-CM | POA: Diagnosis not present

## 2021-07-14 DIAGNOSIS — M62838 Other muscle spasm: Secondary | ICD-10-CM | POA: Diagnosis not present

## 2021-07-14 DIAGNOSIS — M4316 Spondylolisthesis, lumbar region: Secondary | ICD-10-CM | POA: Diagnosis not present

## 2021-08-10 DIAGNOSIS — Z20822 Contact with and (suspected) exposure to covid-19: Secondary | ICD-10-CM | POA: Diagnosis not present

## 2021-08-11 DIAGNOSIS — M5416 Radiculopathy, lumbar region: Secondary | ICD-10-CM | POA: Diagnosis not present

## 2021-08-14 ENCOUNTER — Encounter: Payer: Self-pay | Admitting: Podiatry

## 2021-08-14 ENCOUNTER — Ambulatory Visit (INDEPENDENT_AMBULATORY_CARE_PROVIDER_SITE_OTHER): Payer: Medicare Other | Admitting: Podiatry

## 2021-08-14 DIAGNOSIS — L600 Ingrowing nail: Secondary | ICD-10-CM

## 2021-08-14 NOTE — Progress Notes (Signed)
?Subjective:  ?Patient ID: John [name], male    DOB: [birth year],  MRN: [id number] ? ?Chief Complaint  ?Patient presents with  ? Nail Problem  ?  Possible ingrown   ? ? ?66 y.o. male presents with the above complaint.  Patient presents with bilateral medial border ingrown.  Patient states is painful to touch is progressive gotten worse.  He would like to have it removed.  He has not seen anyone else prior to seeing me.  He denies any other acute complaints.  Hurts with ambulation hurts with pressure ? ? ?Review of Systems: Negative except as noted in the HPI. Denies N/V/F/Ch. ? ?Past Medical History:  ?Diagnosis Date  ? Depression   ? Hyperlipemia   ? Hypertension   ? Memory loss   ? Psoriatic arthritis ([place])   ? ? ?Current Outpatient Medications:  ?  aspirin 81 MG chewable tablet, Chew 1 tablet by mouth daily., Disp: , Rfl:  ?  buPROPion (WELLBUTRIN XL) 300 MG 24 hr tablet, Take 300 mg by mouth daily., Disp: , Rfl:  ?  carvedilol (COREG) 3.125 MG tablet, Take 3.125 mg by mouth 2 (two) times daily with a meal., Disp: , Rfl:  ?  Cinnamon 500 MG capsule, Take 500 mg by mouth daily., Disp: , Rfl:  ?  fish oil-omega-3 fatty acids 1000 MG capsule, Take 2 g by mouth in the morning and at bedtime. , Disp: , Rfl:  ?  folic acid (FOLVITE) 1 MG tablet, Take 1 mg by mouth daily., Disp: , Rfl:  ?  gabapentin (NEURONTIN) 300 MG capsule, Take 300 mg by mouth at bedtime., Disp: , Rfl:  ?  Ginkgo 60 MG TABS, Take 1 tablet by mouth daily., Disp: , Rfl:  ?  GINSENG EXTRACT PO, Take 400 mg by mouth daily., Disp: , Rfl:  ?  Glucosamine 500 MG CAPS, 1 tablet, Disp: , Rfl:  ?  Glucosamine HCl (GLUCOSAMINE PO), Take 500 mg by mouth daily., Disp: , Rfl:  ?  losartan (COZAAR) 25 MG tablet, Take 25 mg by mouth daily., Disp: , Rfl:  ?  modafinil (PROVIGIL) 200 MG tablet, Take 200 mg by mouth daily., Disp: , Rfl:  ?  Multiple Minerals (CALCIUM-MAGNESIUM-ZINC) TABS, Take 1 tablet by mouth daily., Disp: , Rfl:  ?  Multiple Vitamin  (MULTIVITAMIN) capsule, Take 1 capsule by mouth daily., Disp: , Rfl:  ?  SERTRALINE HCL PO, Take by mouth., Disp: , Rfl:  ?  simvastatin (ZOCOR) 10 MG tablet, Take 10 mg by mouth at bedtime., Disp: , Rfl:  ?  tamsulosin (FLOMAX) 0.4 MG CAPS capsule, Take 0.4 mg by mouth daily., Disp: , Rfl:  ? ?Social History  ? ?Tobacco Use  ?Smoking Status Former  ? Types: Cigarettes  ? Quit date: 07/2000  ? Years since quitting: 21.1  ?Smokeless Tobacco Never  ? ? ?Allergies  ?Allergen Reactions  ? Clindamycin/Lincomycin Rash  ? Meropenem Rash  ? Penicillins Rash  ?  ++ pt's wife reported he tolerated keflex in the past++ ?Has patient had a PCN reaction causing immediate rash, facial/tongue/throat swelling, SOB or lightheadedness with hypotension: No ?Has patient had a PCN reaction causing severe rash involving mucus membranes or skin necrosis: No ?Has patient had a PCN reaction that required hospitalization No ?Has patient had a PCN reaction occurring within the last 10 years: No ?If all of the above answers are "NO", then may proceed with Cephalosporin use.  ? ?Objective:  ?There were no vitals filed for  this visit. ?There is no height or weight on file to calculate BMI. ?Constitutional Well developed. ?Well nourished.  ?Vascular Dorsalis pedis pulses palpable bilaterally. ?Posterior tibial pulses palpable bilaterally. ?Capillary refill normal to all digits.  ?No cyanosis or clubbing noted. ?Pedal hair growth normal.  ?Neurologic Normal speech. ?Oriented to person, place, and time. ?Epicritic sensation to light touch grossly present bilaterally.  ?Dermatologic Painful ingrowing nail at medial nail borders of the hallux nail bilaterally. ?No other open wounds. ?No skin lesions.  ?Orthopedic: Normal joint ROM without pain or crepitus bilaterally. ?No visible deformities. ?No bony tenderness.  ? ?Radiographs: None ?Assessment:  ? ?1. Ingrown toenail of right foot   ?2. Ingrown left big toenail   ? ?Plan:  ?Patient was evaluated  and treated and all questions answered. ? ?Ingrown Nail, bilaterally ?-Patient elects to proceed with minor surgery to remove ingrown toenail removal today. Consent reviewed and signed by patient. ?-Ingrown nail excised. See procedure note. ?-Educated on post-procedure care including soaking. Written instructions provided and reviewed. ?-Patient to follow up in 2 weeks for nail check. ? ?Procedure: Excision of Ingrown Toenail ?Location: Bilateral 1st toe medial nail borders. ?Anesthesia: Lidocaine 1% plain; 1.5 mL and Marcaine 0.5% plain; 1.5 mL, digital block. ?Skin Prep: Betadine. ?Dressing: Silvadene; telfa; dry, sterile, compression dressing. ?Technique: Following skin prep, the toe was exsanguinated and a tourniquet was secured at the base of the toe. The affected nail border was freed, split with a nail splitter, and excised. Chemical matrixectomy was then performed with phenol and irrigated out with alcohol. The tourniquet was then removed and sterile dressing applied. ?Disposition: Patient tolerated procedure well. Patient to return in 2 weeks for follow-up.  ? ?No follow-ups on file. ?

## 2021-09-17 DIAGNOSIS — N503 Cyst of epididymis: Secondary | ICD-10-CM | POA: Diagnosis not present

## 2021-09-17 DIAGNOSIS — N492 Inflammatory disorders of scrotum: Secondary | ICD-10-CM | POA: Diagnosis not present

## 2021-09-21 DIAGNOSIS — F3342 Major depressive disorder, recurrent, in full remission: Secondary | ICD-10-CM | POA: Diagnosis not present

## 2021-09-21 DIAGNOSIS — F411 Generalized anxiety disorder: Secondary | ICD-10-CM | POA: Diagnosis not present

## 2021-09-21 DIAGNOSIS — G47419 Narcolepsy without cataplexy: Secondary | ICD-10-CM | POA: Diagnosis not present

## 2021-09-21 DIAGNOSIS — F900 Attention-deficit hyperactivity disorder, predominantly inattentive type: Secondary | ICD-10-CM | POA: Diagnosis not present

## 2021-10-05 ENCOUNTER — Encounter: Payer: Self-pay | Admitting: Neurology

## 2021-10-07 ENCOUNTER — Encounter: Payer: Self-pay | Admitting: Neurology

## 2021-10-07 ENCOUNTER — Ambulatory Visit (INDEPENDENT_AMBULATORY_CARE_PROVIDER_SITE_OTHER): Payer: Medicare Other | Admitting: Neurology

## 2021-10-07 VITALS — BP 150/72 | HR 53 | Ht 66.0 in | Wt 239.0 lb

## 2021-10-07 DIAGNOSIS — G4733 Obstructive sleep apnea (adult) (pediatric): Secondary | ICD-10-CM | POA: Diagnosis not present

## 2021-10-07 DIAGNOSIS — Z9989 Dependence on other enabling machines and devices: Secondary | ICD-10-CM

## 2021-10-07 NOTE — Patient Instructions (Signed)
Sleep Apnea Sleep apnea is a condition in which breathing pauses or becomes shallow during sleep. People with sleep apnea usually snore loudly. They may have times when they gasp and stop breathing for 10 seconds or more during sleep. This may happen many times during the night. Sleep apnea disrupts your sleep and keeps your body from getting the rest that it needs. This condition can increase your risk of certain health problems, including: Heart attack. Stroke. Obesity. Type 2 diabetes. Heart failure. Irregular heartbeat. High blood pressure. The goal of treatment is to help you breathe normally again. What are the causes?  The most common cause of sleep apnea is a collapsed or blocked airway. There are three kinds of sleep apnea: Obstructive sleep apnea. This kind is caused by a blocked or collapsed airway. Central sleep apnea. This kind happens when the part of the brain that controls breathing does not send the correct signals to the muscles that control breathing. Mixed sleep apnea. This is a combination of obstructive and central sleep apnea. What increases the risk? You are more likely to develop this condition if you: Are overweight. Smoke. Have a smaller than normal airway. Are older. Are male. Drink alcohol. Take sedatives or tranquilizers. Have a family history of sleep apnea. Have a tongue or tonsils that are larger than normal. What are the signs or symptoms? Symptoms of this condition include: Trouble staying asleep. Loud snoring. Morning headaches. Waking up gasping. Dry mouth or sore throat in the morning. Daytime sleepiness and tiredness. If you have daytime fatigue because of sleep apnea, you may be more likely to have: Trouble concentrating. Forgetfulness. Irritability or mood swings. Personality changes. Feelings of depression. Sexual dysfunction. This may include loss of interest if you are male, or erectile dysfunction if you are male. How is this  diagnosed? This condition may be diagnosed with: A medical history. A physical exam. A series of tests that are done while you are sleeping (sleep study). These tests are usually done in a sleep lab, but they may also be done at home. How is this treated? Treatment for this condition aims to restore normal breathing and to ease symptoms during sleep. It may involve managing health issues that can affect breathing, such as high blood pressure or obesity. Treatment may include: Sleeping on your side. Using a decongestant if you have nasal congestion. Avoiding the use of depressants, including alcohol, sedatives, and narcotics. Losing weight if you are overweight. Making changes to your diet. Quitting smoking. Using a device to open your airway while you sleep, such as: An oral appliance. This is a custom-made mouthpiece that shifts your lower jaw forward. A continuous positive airway pressure (CPAP) device. This device blows air through a mask when you breathe out (exhale). A nasal expiratory positive airway pressure (EPAP) device. This device has valves that you put into each nostril. A bi-level positive airway pressure (BIPAP) device. This device blows air through a mask when you breathe in (inhale) and breathe out (exhale). Having surgery if other treatments do not work. During surgery, excess tissue is removed to create a wider airway. Follow these instructions at home: Lifestyle Make any lifestyle changes that your health care provider recommends. Eat a healthy, well-balanced diet. Take steps to lose weight if you are overweight. Avoid using depressants, including alcohol, sedatives, and narcotics. Do not use any products that contain nicotine or tobacco. These products include cigarettes, chewing tobacco, and vaping devices, such as e-cigarettes. If you need help quitting, ask   your health care provider. General instructions Take over-the-counter and prescription medicines only as told  by your health care provider. If you were given a device to open your airway while you sleep, use it only as told by your health care provider. If you are having surgery, make sure to tell your health care provider you have sleep apnea. You may need to bring your device with you. Keep all follow-up visits. This is important. Contact a health care provider if: The device that you received to open your airway during sleep is uncomfortable or does not seem to be working. Your symptoms do not improve. Your symptoms get worse. Get help right away if: You develop: Chest pain. Shortness of breath. Discomfort in your back, arms, or stomach. You have: Trouble speaking. Weakness on one side of your body. Drooping in your face. These symptoms may represent a serious problem that is an emergency. Do not wait to see if the symptoms will go away. Get medical help right away. Call your local emergency services (911 in the [place] Do not drive yourself to the hospital. Summary Sleep apnea is a condition in which breathing pauses or becomes shallow during sleep. The most common cause is a collapsed or blocked airway. The goal of treatment is to restore normal breathing and to ease symptoms during sleep. This information is not intended to replace advice given to you by your health care provider. Make sure you discuss any questions you have with your health care provider. Document Revised: 11/05/2020 Document Reviewed: 03/07/2020 Elsevier Patient Education  2023 Elsevier Inc.  

## 2021-10-07 NOTE — Progress Notes (Signed)
SLEEP MEDICINE CLINIC    Provider:  Melvyn Novas, MD  Primary Care Physician:  Irena Reichmann, DO [address] [place] [address]     Referring Provider: Irena Reichmann, Do [address] [place],  [place] [address]          Chief Complaint according to patient   Patient presents with:     New Patient (Initial Visit)       First visit on new CPAP, feeling less sleepy, compliant. Has central apneas arising on download. Has been on Provigil by Psychiatrist      HISTORY OF PRESENT ILLNESS:  John [name] is a 66 y.o. Caucasian male patient of Dr Terrace Arabia , here today for a CPAP follow up after HST in March 2023.  John [name] underwent a split-night titration in lab and this was taken place on 07 July 2021.  He reached an apnea hypopnea index of 44.5/h in supine sleep and nonsupine sleep the results were almost the same.  He did only sleep for about 44% of the baseline time probably because he is CPAP dependent.  So the patient was split at 6 cm water and put step-by-step up to a final of 14 cm water pressure.  Here his apnea hypopnea index was reduced to 3.3/h and his sleep efficiency was 95%. He was finished with a auto titration CPAP between 8 and 16 cm water pressure with 3 cm expiratory pressure relief humidification and he uses his preferred medium sized P10 nasal pillow by ResMed. He is using CPAP without water- but has a heated tube. He is followed by [facility].   I am also able today to look at his download he is a 93% highly compliant patient used the machine 28 out of 30 days.  The average user time is 7 hours 51 minutes to nearly 8 hours each night 95th percentile pressure at home is against 13 cm water so it confirms what was seen during his in lab titration.  He does have a lot of central apneas now arising 7.8/h we did not see these during his sleep study.  His AHI was therefore 12.7 and is a little bit too high for my taste. He does not take  opiates. He has no history of renal failure or hepatic failure, no CHF.   Epworth score decreased to 7/14, BMI is 38.6 and he still has a high neck size, Mallampati.    Who has seen him for memory loss but referred today seen upon referral by Irena Reichmann, DO, on 10/07/2021 .he arrived late and has a paper external information, This consult started at 9.15 AM, arrival time was 8.30.  Chief concern according to patient :  I am needing CPAP.    John [name]  has a past medical history of CVA- Stroke ( Dr Terrace Arabia 10-2019), Depression, Hyperlipemia, Hypertension,OSA on CPAP and Psoriatic arthritis (HCC).He worked as a Freight forwarder.    The patient had the first sleep study in the year 2010, and has been on CPAP since. [facility] in [place]. Last machine st up 07-09-2016. 66 years old.  Sleep relevant medical history: Nocturia before CPAP- , Tonsillectomy in childhood, cervical spine DDD, seasonal allergic reactions.   Family medical /sleep history: no other family member on CPAP with OSA, sister is a loud snorer.   Social history:  Patient is retired from Retail buyer and lives in a household with spouse.   Pets are present.  2 dogs.  Tobacco use- none.   ETOH use ; socially , 2 a month/ on cruises,  Caffeine intake in form of Coffee( /) Soda( 2/ day) Tea ( /) nor energy drinks. Regular exercise in form of -none.      Sleep habits are as follows:  The patient's dinner time is between 3 PM, and he snacks later. The patient goes to bed at 10-11 PM and continues to sleep for 5-7 hours, wakes for one bathroom break,.   The preferred sleep position is laterally, has nose pillows. Sometimes supine , with the support of 3 pillows. Dreams are reportedly frequent/vivid.  9 AM is the usual rise time. The patient wakes up spontaneously.  He reports not feeling refreshed or restored in AM, On CPAP without symptoms such as dry mouth, morning headaches, and residual fatigue.  Naps are taken  infrequently, often while  TV is watching him-lasting from 15 to 60 minutes .   Review of Systems: Out of a complete 14 system review, the patient complains of only the following symptoms, and all other reviewed systems are negative.:  Fatigue, sleepiness , snoring without CPAP only.    How likely are you to doze in the following situations: 0 = not likely, 1 = slight chance, 2 = moderate chance, 3 = high chance   Sitting and Reading? Watching Television? Sitting inactive in a public place (theater or meeting)? As a passenger in a car for an hour without a break? Lying down in the afternoon when circumstances permit? Sitting and talking to someone? Sitting quietly after lunch without alcohol? In a car, while stopped for a few minutes in traffic?   Total = 7 from 12 / 24 points   FSS endorsed at na/ 63 points.   GDS : n/a    TEST : Mr. His last sleep study was approximately 10 years ago arranged to [facility] in [place].  His referral does not contain any of the previous sleep study but he brought his machine to today's visit and he was referred with a extensive laboratory record.  According to his referring physician used to be Rosanne Ashing can he has been suffering from some fatigue even while being on CPAP and at one time carries a diagnosis of attention deficit hyperactivity disorder.  He was treated for hypertension, he was tested for testosterone deficiency vitamin D and vitamin B12.  He has been on CPAP for over a decade but his current machine is just 66 years old and needs to be replaced there is no problem with the current machine it is still functioning at this time but the software will for no longer be updated and there could be problems in the near future.  For the last 3 or 4 years he did not report he had reported urinary frequency but this has been controlled by medication and was not is CPAP dependent finding.  He is 100% compliant with his  current ResMed Airview machine and AirSense 10 AutoSet serial #[id number] for 6.  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  Current residual AHI is 5.2/h which is sufficient if his baseline apnea was severe.  The residual apneas are more obstructive than central in origin.  95th percentile pressure is 12.8 and his ear leakage with the current nasal pillow is mild at 14.9 L/min.  His Epworth sleepiness score is endorsed at 12 points.  He has been on armodafinil the generic form of Nuvigil at 250 mg a day he is also on Wellbutrin, Prozac.    Social History   Socioeconomic History   Marital status: Married    Spouse name: John [name]   Number of children: 1   Years of education: 12   Highest education level: High school graduate  Occupational History   Occupation: Retired  Tobacco Use   Smoking status: Former    Types: Cigarettes    Quit date: 07/2000    Years since quitting: 21.2   Smokeless tobacco: Never  Substance and Sexual Activity   Alcohol use: Yes    Comment: one drink every two months   Drug use: Never   Sexual activity: Not Currently  Other Topics Concern   Not on file  Social History Narrative   Lives with wife.   Right-handed.   No daily caffeine use   Social Determinants of Health   Financial Resource Strain: Not on file  Food Insecurity: Not on file  Transportation Needs: Not on file  Physical Activity: Not on file  Stress: Not on file  Social Connections: Not on file    Family History  Problem Relation Age of Onset   COPD Mother        died at 8583   Alzheimer's disease Mother    Diabetes Father        died at 8096   Alzheimer's disease Father    Early death Paternal Aunt     Past Medical History:  Diagnosis Date   Depression    Hyperlipemia    Hypertension    Memory loss    Psoriatic arthritis (HCC)     Past Surgical History:  Procedure Laterality  Date   TONSILLECTOMY     VIDEO ASSISTED THORACOSCOPY (VATS)/EMPYEMA Right 05/13/2016   Procedure: VIDEO ASSISTED THORACOSCOPY (VATS)/DRAIN EMPYEMA;  Surgeon: Alleen BorneBryan K Bartle, MD;  Location: [facility] OR;  Service: Thoracic;  Laterality: Right;     Current Outpatient Medications on File Prior to Visit  Medication Sig Dispense Refill   aspirin 81 MG chewable tablet Chew 1 tablet by mouth daily.     buPROPion (WELLBUTRIN XL) 300 MG 24 hr tablet Take 300 mg by mouth daily.     carvedilol (COREG) 3.125 MG tablet Take 3.125 mg by mouth 2 (two) times daily with a meal.     Cinnamon 500 MG capsule Take 500 mg by mouth daily.     fish oil-omega-3 fatty acids 1000 MG capsule Take 2 g by mouth in the morning and at bedtime.      folic acid (FOLVITE) 1 MG tablet Take 1 mg by mouth daily.     gabapentin (NEURONTIN) 300 MG capsule Take 300 mg by mouth at bedtime.     Ginkgo 60 MG TABS Take 1 tablet by mouth daily.     GINSENG EXTRACT PO Take 400 mg by mouth daily.     Glucosamine 500 MG CAPS 1 tablet     Glucosamine HCl (GLUCOSAMINE PO) Take 500 mg by mouth daily.     losartan (COZAAR) 25 MG tablet Take 25 mg by mouth daily.     modafinil (PROVIGIL) 200 MG tablet Take 200 mg by mouth daily.     Multiple Minerals (CALCIUM-MAGNESIUM-ZINC) TABS Take 1 tablet by mouth daily.     Multiple Vitamin (MULTIVITAMIN) capsule Take 1 capsule by mouth daily.     SERTRALINE HCL  PO Take by mouth.     simvastatin (ZOCOR) 10 MG tablet Take 10 mg by mouth at bedtime.     tamsulosin (FLOMAX) 0.4 MG CAPS capsule Take 0.4 mg by mouth daily.     No current facility-administered medications on file prior to visit.    Allergies  Allergen Reactions   Clindamycin/Lincomycin Rash   Meropenem Rash   Penicillins Rash    ++ pt's wife reported he tolerated keflex in the past++ Has patient had a PCN reaction causing immediate rash, facial/tongue/throat swelling, SOB or lightheadedness with hypotension: No Has patient had a PCN reaction  causing severe rash involving mucus membranes or skin necrosis: No Has patient had a PCN reaction that required hospitalization No Has patient had a PCN reaction occurring within the last 10 years: No If all of the above answers are "NO", then may proceed with Cephalosporin use.    Physical exam:  Today's Vitals   10/07/21 0928  BP: (!) 150/72  Pulse: (!) 53  Weight: 239 lb (108.4 kg)  Height: 5\' 6"  (1.676 m)   Body mass index is 38.58 kg/m.   Wt Readings from Last 3 Encounters:  10/07/21 239 lb (108.4 kg)  07/01/21 247 lb 8 oz (112.3 kg)  10/09/19 243 lb 8 oz (110.5 kg)     Ht Readings from Last 3 Encounters:  10/07/21 5\' 6"  (1.676 m)  07/01/21 5\' 6"  (1.676 m)  10/09/19 5\' 6"  (1.676 m)      General: The patient is awake, alert and appears not in acute distress. The patient is well groomed. Head: Normocephalic, atraumatic. Neck is supple. Mallampati 3,  neck circumference:19 inches . Nasal airflow  patent.  Retrognathia is seen.  He is a mouth breather.  Dental status: crowded lower .  Cardiovascular:  Regular rate and cardiac rhythm by pulse,  without distended neck veins. Respiratory: Lungs are clear to auscultation.  Skin:  Without evidence of ankle edema, or rash. Trunk: The patient's posture is erect.   Neurologic exam : The patient is awake and alert, oriented to place and time.   Memory subjective described as impaired - 2021 evaluation with dr .   Attention span & concentration ability appears restricted Speech is fluent,  without  dysarthria, mild dysphonia , no aphasia.  Mood and affect are appropriate.   Cranial nerves: no loss of smell or taste reported  Pupils are equal and briskly reactive to light. Funduscopic exam: deferred. .  Extraocular movements in vertical and horizontal planes were intact and without nystagmus. No Diplopia. Visual fields by finger perimetry are intact. Hearing was intact to soft voice and finger rubbing.    Facial sensation  intact to fine touch.  Facial motor strength is symmetric and tongue and uvula move midline.  Neck ROM : rotation, tilt and flexion extension were normal for age and shoulder shrug was symmetrical.    Motor exam:  Symmetric bulk, tone and ROM.   Normal tone without cog- wheeling, symmetric grip strength .   Sensory:  Fine touch, pinprick and vibration were  normal.  Proprioception tested in the upper extremities was normal.   Coordination: Rapid alternating movements in the fingers/hands were of normal speed.  The Finger-to-nose maneuver was intact without evidence of ataxia, dysmetria or tremor.   Gait and station: Patient could rise unassisted from a seated position, walked without assistive device.  Stance is of normal width/ base .  Toe and heel walk were deferred.  Deep tendon reflexes: in the  upper and lower extremities are symmetric and intact.  Babinski response was deferred .       After spending a total time of 60  minutes face to face and additional time for physical and neurologic examination, review of laboratory studies,  personal review of imaging studies, reports and results of other testing and review of referral information / records as far as provided in visit, I have established the following assessments:  1) CPAP user, compliant, has been developing central apneas. 2) he has seen dr Terrace Arabia for memory loss within the last 18 months..     My Plan is to proceed with:  1) RV with NP for sleep only- in 6 months. Looking at central trend.   I would like to thank Irena Reichmann, DO and Irena Reichmann, Do [address] [place],  [place] [address] for allowing me to meet with and to take care of this pleasant patient.   In short, John [name] is presenting with presumed severe  OSA, and needs a new CPAP .  I plan to follow up either personally or through our NP within 2-4  months.   CC: I will share my notes with PCP and Dr Terrace Arabia, Dr  Dierdre Forth  Electronically signed by: Melvyn Novas, MD 10/07/2021 9:46 AM  [facility] Board certified by The [employer] of Sleep Medicine and Diplomate of the [employer] of Sleep Medicine. Board certified In Neurology through the ABPN, Fellow of the [employer] of Neurology. Medical Director of [facility].

## 2021-10-08 NOTE — Progress Notes (Signed)
New order faxed to DME: [facility] Fax:[phone]   Fax confirmation received 

## 2021-11-02 DIAGNOSIS — R739 Hyperglycemia, unspecified: Secondary | ICD-10-CM | POA: Diagnosis not present

## 2021-11-02 DIAGNOSIS — E785 Hyperlipidemia, unspecified: Secondary | ICD-10-CM | POA: Diagnosis not present

## 2021-11-02 DIAGNOSIS — R7989 Other specified abnormal findings of blood chemistry: Secondary | ICD-10-CM | POA: Diagnosis not present

## 2021-11-02 DIAGNOSIS — R350 Frequency of micturition: Secondary | ICD-10-CM | POA: Diagnosis not present

## 2021-11-02 DIAGNOSIS — Z125 Encounter for screening for malignant neoplasm of prostate: Secondary | ICD-10-CM | POA: Diagnosis not present

## 2021-11-02 DIAGNOSIS — I10 Essential (primary) hypertension: Secondary | ICD-10-CM | POA: Diagnosis not present

## 2021-11-09 DIAGNOSIS — E785 Hyperlipidemia, unspecified: Secondary | ICD-10-CM | POA: Diagnosis not present

## 2021-11-09 DIAGNOSIS — Z862 Personal history of diseases of the blood and blood-forming organs and certain disorders involving the immune mechanism: Secondary | ICD-10-CM | POA: Diagnosis not present

## 2021-11-09 DIAGNOSIS — H8110 Benign paroxysmal vertigo, unspecified ear: Secondary | ICD-10-CM | POA: Diagnosis not present

## 2021-11-09 DIAGNOSIS — Z23 Encounter for immunization: Secondary | ICD-10-CM | POA: Diagnosis not present

## 2021-11-09 DIAGNOSIS — I10 Essential (primary) hypertension: Secondary | ICD-10-CM | POA: Diagnosis not present

## 2021-11-09 DIAGNOSIS — F329 Major depressive disorder, single episode, unspecified: Secondary | ICD-10-CM | POA: Diagnosis not present

## 2021-11-09 DIAGNOSIS — M4316 Spondylolisthesis, lumbar region: Secondary | ICD-10-CM | POA: Diagnosis not present

## 2021-11-09 DIAGNOSIS — G4733 Obstructive sleep apnea (adult) (pediatric): Secondary | ICD-10-CM | POA: Diagnosis not present

## 2021-12-08 DIAGNOSIS — H53143 Visual discomfort, bilateral: Secondary | ICD-10-CM | POA: Diagnosis not present

## 2021-12-08 DIAGNOSIS — H5203 Hypermetropia, bilateral: Secondary | ICD-10-CM | POA: Diagnosis not present

## 2021-12-08 DIAGNOSIS — H524 Presbyopia: Secondary | ICD-10-CM | POA: Diagnosis not present

## 2022-01-12 DIAGNOSIS — Z23 Encounter for immunization: Secondary | ICD-10-CM | POA: Diagnosis not present

## 2022-03-01 DIAGNOSIS — M25561 Pain in right knee: Secondary | ICD-10-CM | POA: Diagnosis not present

## 2022-03-01 DIAGNOSIS — M5416 Radiculopathy, lumbar region: Secondary | ICD-10-CM | POA: Diagnosis not present

## 2022-03-01 DIAGNOSIS — G8929 Other chronic pain: Secondary | ICD-10-CM | POA: Diagnosis not present

## 2022-03-01 DIAGNOSIS — M7061 Trochanteric bursitis, right hip: Secondary | ICD-10-CM | POA: Diagnosis not present

## 2022-03-01 DIAGNOSIS — M25551 Pain in right hip: Secondary | ICD-10-CM | POA: Diagnosis not present

## 2022-04-14 NOTE — Patient Instructions (Incomplete)
Please continue using your CPAP regularly. While your insurance requires that you use CPAP at least 4 hours each night on 70% of the nights, I recommend, that you not skip any nights and use it throughout the night if you can. Getting used to CPAP and staying with the treatment long term does take time and patience and discipline. Untreated obstructive sleep apnea when it is moderate to severe can have an adverse impact on cardiovascular health and raise her risk for heart disease, arrhythmias, hypertension, congestive heart failure, stroke and diabetes. Untreated obstructive sleep apnea causes sleep disruption, nonrestorative sleep, and sleep deprivation. This can have an impact on your day to day functioning and cause daytime sleepiness and impairment of cognitive function, memory loss, mood disturbance, and problems focussing. Using CPAP regularly can improve these symptoms.  Try to work on avoiding supine sleep position. Look at dose times of both modafinil and bupropion. Try not to take any stimulant medications after lunch. You could consider cognitive behavioral therapy with psychology for insomnia. May also consider over the counter melatonin or valerian root. May consider discussion with psychiatry regarding mood/attention deficit management medications.   Follow up in 6 months with John [name]   Management of Memory Problems   There are some general things you can do to help manage your memory problems.  Your memory may not in fact recover, but by using techniques and strategies you will be able to manage your memory difficulties better.   1)  Establish a routine. Try to establish and then stick to a regular routine.  By doing this, you will get used to what to expect and you will reduce the need to rely on your memory.  Also, try to do things at the same time of day, such as taking your medication or checking your calendar first thing in the morning. Think about think that you can do as a part  of a regular routine and make a list.  Then enter them into a daily planner to remind you.  This will help you establish a routine.   2)  Organize your environment. Organize your environment so that it is uncluttered.  Decrease visual stimulation.  Place everyday items such as keys or cell phone in the same place every day (ie.  Basket next to front door) Use post it notes with a brief message to yourself (ie. Turn off light, lock the door) Use labels to indicate where things go (ie. Which cupboards are for food, dishes, etc.) Keep a notepad and pen by the telephone to take messages   3)  Memory Aids A diary or journal/notebook/daily planner Making a list (shopping list, chore list, to do list that needs to be done) Using an alarm as a reminder (kitchen timer or cell phone alarm) Using cell phone to store information (Notes, Calendar, Reminders) Calendar/White board placed in a prominent position Post-it notes   In order for memory aids to be useful, you need to have good habits.  It's no good remembering to make a note in your journal if you don't remember to look in it.  Try setting aside a certain time of day to look in journal.   4)  Improving mood and managing fatigue. There may be other factors that contribute to memory difficulties.  Factors, such as anxiety, depression and tiredness can affect memory. Regular gentle exercise can help improve your mood and give you more energy. Exercise: there are short videos created by the [employer] on  Health specially for older adults: https://bit.ly/2I30q97.  Mediterranean diet: which emphasizes fruits, vegetables, whole grains, legumes, fish, and other seafood; unsaturated fats such as olive oils; and low amounts of red meat, eggs, and sweets. A variation of this, called MIND ([place] Intervention for Neurodegenerative Delay) incorporates the DASH (Dietary Approaches to Stop Hypertension) diet, which has been shown to lower high  blood pressure, a risk factor for Alzheimer's disease. More information at: RepublicFor[employer].gl.  Aerobic exercise that improve heart health is also good for the mind.  [employer] on Aging have short videos for exercises that you can do at home: GoldCloset.com.ee Simple relaxation techniques may help relieve symptoms of anxiety Try to get back to completing activities or hobbies you enjoyed doing in the past. Learn to pace yourself through activities to decrease fatigue. Find out about some local support groups where you can share experiences with others. Try and achieve 7-8 hours of sleep at night.

## 2022-04-14 NOTE — Progress Notes (Unsigned)
PATIENT: John John [name] DOB: June 29, 1955  REASON FOR VISIT: follow up HISTORY FROM: patient  No chief complaint on file.  Dr Krista Blue: CVD, memory loss Dr Dohmeier: sleep    HISTORY OF PRESENT ILLNESS:  04/14/22 ALL:  John John [name] is a 67 y.o. male here today for follow up for OSA on CPAP and memory loss.    Memory  He continues modafinil prescribed by psychiatry.   He is followed regularly by PCP. He continues asa 81mg  and simvastatin 10mg  daily.   HISTORY: (copied from Dr Dohmeier's previous note)  John John [name] is a 67 y.o. Caucasian male patient of Dr Krista Blue , here today for a CPAP follow up after HST in March 2023.  [name] underwent a split-night titration in lab and this was taken place on 07 July 2021.  He reached an apnea hypopnea index of 44.5/h in supine sleep and nonsupine sleep the results were almost the same.  He did only sleep for about 44% of the baseline time probably because he is CPAP dependent.  So the patient was split at 6 cm water and put step-by-step up to a final of 14 cm water pressure.  Here his apnea hypopnea index was reduced to 3.3/h and his sleep efficiency was 95%. He was finished with a auto titration CPAP between 8 and 16 cm water pressure with 3 cm expiratory pressure relief humidification and he uses his preferred medium sized P10 nasal pillow by [employer]. He is using CPAP without water- but has a heated tube. He is followed by [facility].   I am also able today to look at his download he is a 93% highly compliant patient used the machine 28 out of 30 days.  The average user time is 7 hours 51 minutes to nearly 8 hours each night 95th percentile pressure at home is against 13 cm water so it confirms what was seen during his in lab titration.  He does have a lot of central apneas now arising 7.8/h we did not see these during his sleep study.  His AHI was therefore 12.7 and is a little bit too high for my taste. He does not take opiates. He has no  history of renal failure or hepatic failure, no CHF.    Epworth score decreased to 7/14, BMI is 38.6 and he still has a high neck size, Mallampati.     Who has seen him for memory loss but referred today seen upon referral by John Morning, DO, on 10/07/2021 .he arrived late and has a paper external information, This consult started at 9.15 AM, arrival time was 8.30.  Chief concern according to patient :  I am needing CPAP.    John John [name]  has a past medical history of CVA- Stroke ( Dr Krista Blue 10-2019), Depression, Hyperlipemia, Hypertension,OSA on CPAP and Psoriatic arthritis ([place]).He worked as a Firefighter.    The patient had the first sleep study in the year 2010, and has been on CPAP since. [name] in [place]. Last machine st up 07-09-2016. 67 years old.  Sleep relevant medical history: Nocturia before CPAP- , Tonsillectomy in childhood, cervical spine DDD, seasonal allergic reactions.   Family medical /sleep history: no other family member on CPAP with OSA, sister is a loud snorer.   Social history:  Patient is retired from Tourist information centre manager and lives in a household with spouse.   Pets are present. 2 dogs.  Tobacco use- none.   ETOH use ;  socially , 2 a month/ on cruises,  Caffeine intake in form of Coffee( /) Soda( 2/ day) Tea ( /) nor energy drinks. Regular exercise in form of -none.     Sleep habits are as follows:  The patient's dinner time is between 3 PM, and he snacks later. The patient goes to bed at 10-11 PM and continues to sleep for 5-7 hours, wakes for one bathroom break,.   The preferred sleep position is laterally, has nose pillows. Sometimes supine , with the support of 3 pillows. Dreams are reportedly frequent/vivid.  9 AM is the usual rise time. The patient wakes up spontaneously.  He reports not feeling refreshed or restored in AM, On CPAP without symptoms such as dry mouth, John [name] headaches, and residual fatigue.  Naps are taken infrequently, often while  TV is  watching him-lasting from 15 to 60 minutes   REVIEW OF SYSTEMS: Out of a complete 14 system review of symptoms, the patient complains only of the following symptoms, and all other reviewed systems are negative.  ESS:  ALLERGIES: Allergies  Allergen Reactions   Clindamycin/Lincomycin Rash   Meropenem Rash   Penicillins Rash    ++ pt's wife reported he tolerated keflex in the past++ Has patient had a PCN reaction causing immediate rash, facial/tongue/throat swelling, SOB or lightheadedness with hypotension: No Has patient had a PCN reaction causing severe rash involving mucus membranes or skin necrosis: No Has patient had a PCN reaction that required hospitalization No Has patient had a PCN reaction occurring within the last 10 years: No If all of the above answers are "NO", then may proceed with Cephalosporin use.    HOME MEDICATIONS: Outpatient Medications Prior to Visit  Medication Sig Dispense Refill   aspirin 81 MG chewable tablet Chew 1 tablet by mouth daily.     buPROPion (WELLBUTRIN XL) 300 MG 24 hr tablet Take 300 mg by mouth daily.     carvedilol (COREG) 3.125 MG tablet Take 3.125 mg by mouth 2 (two) times daily with a meal.     Cinnamon 500 MG capsule Take 500 mg by mouth daily.     fish oil-omega-3 fatty acids 1000 MG capsule Take 2 g by mouth in the John [name] and at bedtime.      folic acid (FOLVITE) 1 MG tablet Take 1 mg by mouth daily.     gabapentin (NEURONTIN) 300 MG capsule Take 300 mg by mouth at bedtime.     Ginkgo 60 MG TABS Take 1 tablet by mouth daily.     GINSENG EXTRACT PO Take 400 mg by mouth daily.     Glucosamine 500 MG CAPS 1 tablet     Glucosamine HCl (GLUCOSAMINE PO) Take 500 mg by mouth daily.     losartan (COZAAR) 25 MG tablet Take 25 mg by mouth daily.     modafinil (PROVIGIL) 200 MG tablet Take 200 mg by mouth daily.     Multiple Minerals (CALCIUM-MAGNESIUM-ZINC) TABS Take 1 tablet by mouth daily.     Multiple Vitamin (MULTIVITAMIN) capsule Take 1  capsule by mouth daily.     SERTRALINE HCL PO Take by mouth.     simvastatin (ZOCOR) 10 MG tablet Take 10 mg by mouth at bedtime.     tamsulosin (FLOMAX) 0.4 MG CAPS capsule Take 0.4 mg by mouth daily.     No facility-administered medications prior to visit.    PAST MEDICAL HISTORY: Past Medical History:  Diagnosis Date   Depression    Hyperlipemia  Hypertension    Memory loss    Psoriatic arthritis ([place])     PAST SURGICAL HISTORY: Past Surgical History:  Procedure Laterality Date   TONSILLECTOMY     VIDEO ASSISTED THORACOSCOPY (VATS)/EMPYEMA Right 05/13/2016   Procedure: VIDEO ASSISTED THORACOSCOPY (VATS)/DRAIN EMPYEMA;  Surgeon: Gaye Pollack, MD;  Location: [facility] OR;  Service: Thoracic;  Laterality: Right;    FAMILY HISTORY: Family History  Problem Relation Age of Onset   COPD Mother        died at 18   Alzheimer's disease Mother    Diabetes Father        died at 60   Alzheimer's disease Father    Early death Paternal Aunt     SOCIAL HISTORY: Social History   Socioeconomic History   Marital status: Married    Spouse name: John John [name]   Number of children: 1   Years of education: 12   Highest education level: High school graduate  Occupational History   Occupation: Retired  Tobacco Use   Smoking status: Former    Types: Cigarettes    Quit date: 07/2000    Years since quitting: 21.7   Smokeless tobacco: Never  Substance and Sexual Activity   Alcohol use: Yes    Comment: one drink every two months   Drug use: Never   Sexual activity: Not Currently  Other Topics Concern   Not on file  Social History Narrative   Lives with wife.   Right-handed.   No daily caffeine use   Social Determinants of Radio broadcast assistant Strain: Not on file  Food Insecurity: Not on file  Transportation Needs: Not on file  Physical Activity: Not on file  Stress: Not on file  Social Connections: Not on file  Intimate Partner Violence: Not on file     PHYSICAL  EXAM  There were no vitals filed for this visit. There is no height or weight on file to calculate BMI.  Generalized: Well developed, in no acute distress  Cardiology: normal rate and rhythm, no murmur noted Respiratory: clear to auscultation bilaterally  Neurological examination  Mentation: Alert oriented to time, place, history taking. Follows all commands speech and language fluent Cranial nerve II-XII: Pupils were equal round reactive to light. Extraocular movements were full, visual field were full  Motor: The motor testing reveals 5 over 5 strength of all 4 extremities. Good symmetric motor tone is noted throughout.  Gait and station: Gait is normal.    DIAGNOSTIC DATA (LABS, IMAGING, TESTING) - I reviewed patient records, labs, notes, testing and imaging myself where available.     07/05/2019    8:34 AM  MMSE - Mini Mental State Exam  Orientation to time 5  Orientation to Place 5  Registration 3  Attention/ Calculation 5  Recall 3  Language- name 2 objects 2  Language- repeat 1  Language- follow 3 step command 3  Language- read & follow direction 1  Write a sentence 1  Copy design 1  Total score 30     Lab Results  Component Value Date   WBC 10.4 05/19/2016   HGB 9.0 (L) 05/19/2016   HCT 29.0 (L) 05/19/2016   MCV 89.8 05/19/2016   PLT 328 05/19/2016      Component Value Date/Time   NA 137 05/17/2016 0500   K 4.2 05/17/2016 0500   CL 105 05/17/2016 0500   CO2 26 05/17/2016 0500   GLUCOSE 130 (H) 05/17/2016 0500   BUN 17 05/17/2016  0500   CREATININE 1.08 05/17/2016 0500   CALCIUM 8.2 (L) 05/17/2016 0500   PROT 4.9 (L) 05/15/2016 0435   ALBUMIN 1.7 (L) 05/15/2016 0435   AST 55 (H) 05/15/2016 0435   ALT 74 (H) 05/15/2016 0435   ALKPHOS 98 05/15/2016 0435   BILITOT 0.3 05/15/2016 0435   GFRNONAA >60 05/17/2016 0500   GFRAA >60 05/17/2016 0500   No results found for: "CHOL", "HDL", "LDLCALC", "LDLDIRECT", "TRIG", "CHOLHDL" No results found for:  "HGBA1C" No results found for: "VITAMINB12" Lab Results  Component Value Date   TSH 0.690 05/11/2016     ASSESSMENT AND PLAN 67 y.o. year old male  has a past medical history of Depression, Hyperlipemia, Hypertension, Memory loss, and Psoriatic arthritis ([place]). here with   No diagnosis found.    John John [name] is doing well on CPAP therapy. Compliance report reveals ***. *** was encouraged to continue using CPAP nightly and for greater than 4 hours each night. We will update supply orders as indicated. Risks of untreated sleep apnea review and education materials provided. Healthy lifestyle habits encouraged. *** will follow up in ***, sooner if needed. *** verbalizes understanding and agreement with this plan.    No orders of the defined types were placed in this encounter.    No orders of the defined types were placed in this encounter.     Debbora Presto, FNP-C 04/14/2022, 9:13 AM [facility] [address], [place], [place] [address] [phone]

## 2022-04-15 ENCOUNTER — Encounter: Payer: Self-pay | Admitting: Family Medicine

## 2022-04-15 ENCOUNTER — Ambulatory Visit (INDEPENDENT_AMBULATORY_CARE_PROVIDER_SITE_OTHER): Payer: Medicare Other | Admitting: Family Medicine

## 2022-04-15 VITALS — BP 110/53 | HR 58 | Ht 70.0 in | Wt 237.5 lb

## 2022-04-15 DIAGNOSIS — G4709 Other insomnia: Secondary | ICD-10-CM | POA: Diagnosis not present

## 2022-04-15 DIAGNOSIS — G4733 Obstructive sleep apnea (adult) (pediatric): Secondary | ICD-10-CM | POA: Diagnosis not present

## 2022-04-15 DIAGNOSIS — Z9989 Dependence on other enabling machines and devices: Secondary | ICD-10-CM

## 2022-04-15 DIAGNOSIS — R413 Other amnesia: Secondary | ICD-10-CM | POA: Diagnosis not present

## 2022-04-15 DIAGNOSIS — Z9189 Other specified personal risk factors, not elsewhere classified: Secondary | ICD-10-CM | POA: Diagnosis not present

## 2022-04-19 DIAGNOSIS — I10 Essential (primary) hypertension: Secondary | ICD-10-CM | POA: Diagnosis not present

## 2022-04-19 DIAGNOSIS — Z862 Personal history of diseases of the blood and blood-forming organs and certain disorders involving the immune mechanism: Secondary | ICD-10-CM | POA: Diagnosis not present

## 2022-04-19 DIAGNOSIS — E785 Hyperlipidemia, unspecified: Secondary | ICD-10-CM | POA: Diagnosis not present

## 2022-04-20 DIAGNOSIS — M25552 Pain in left hip: Secondary | ICD-10-CM | POA: Diagnosis not present

## 2022-04-20 DIAGNOSIS — M25551 Pain in right hip: Secondary | ICD-10-CM | POA: Diagnosis not present

## 2022-04-26 DIAGNOSIS — I10 Essential (primary) hypertension: Secondary | ICD-10-CM | POA: Diagnosis not present

## 2022-04-26 DIAGNOSIS — M4316 Spondylolisthesis, lumbar region: Secondary | ICD-10-CM | POA: Diagnosis not present

## 2022-04-26 DIAGNOSIS — E785 Hyperlipidemia, unspecified: Secondary | ICD-10-CM | POA: Diagnosis not present

## 2022-04-26 DIAGNOSIS — Z0000 Encounter for general adult medical examination without abnormal findings: Secondary | ICD-10-CM | POA: Diagnosis not present

## 2022-04-26 DIAGNOSIS — Z125 Encounter for screening for malignant neoplasm of prostate: Secondary | ICD-10-CM | POA: Diagnosis not present

## 2022-04-26 DIAGNOSIS — Z862 Personal history of diseases of the blood and blood-forming organs and certain disorders involving the immune mechanism: Secondary | ICD-10-CM | POA: Diagnosis not present

## 2022-04-26 DIAGNOSIS — N503 Cyst of epididymis: Secondary | ICD-10-CM | POA: Diagnosis not present

## 2022-04-26 DIAGNOSIS — F329 Major depressive disorder, single episode, unspecified: Secondary | ICD-10-CM | POA: Diagnosis not present

## 2022-04-26 DIAGNOSIS — E7800 Pure hypercholesterolemia, unspecified: Secondary | ICD-10-CM | POA: Diagnosis not present

## 2022-04-26 DIAGNOSIS — R7989 Other specified abnormal findings of blood chemistry: Secondary | ICD-10-CM | POA: Diagnosis not present

## 2022-05-20 DIAGNOSIS — N430 Encysted hydrocele: Secondary | ICD-10-CM | POA: Diagnosis not present

## 2022-05-24 DIAGNOSIS — M25561 Pain in right knee: Secondary | ICD-10-CM | POA: Diagnosis not present

## 2022-05-24 DIAGNOSIS — M25551 Pain in right hip: Secondary | ICD-10-CM | POA: Diagnosis not present

## 2022-05-24 DIAGNOSIS — Z6833 Body mass index (BMI) 33.0-33.9, adult: Secondary | ICD-10-CM | POA: Diagnosis not present

## 2022-05-24 DIAGNOSIS — M5416 Radiculopathy, lumbar region: Secondary | ICD-10-CM | POA: Diagnosis not present

## 2022-05-27 DIAGNOSIS — N430 Encysted hydrocele: Secondary | ICD-10-CM | POA: Diagnosis not present

## 2022-06-17 DIAGNOSIS — G47419 Narcolepsy without cataplexy: Secondary | ICD-10-CM | POA: Diagnosis not present

## 2022-06-17 DIAGNOSIS — F411 Generalized anxiety disorder: Secondary | ICD-10-CM | POA: Diagnosis not present

## 2022-06-17 DIAGNOSIS — F900 Attention-deficit hyperactivity disorder, predominantly inattentive type: Secondary | ICD-10-CM | POA: Diagnosis not present

## 2022-06-17 DIAGNOSIS — F3342 Major depressive disorder, recurrent, in full remission: Secondary | ICD-10-CM | POA: Diagnosis not present

## 2022-06-28 ENCOUNTER — Telehealth: Payer: Self-pay

## 2022-06-28 NOTE — Telephone Encounter (Signed)
Called pt at [phone].  He called our office by mistake. He was trying to reach [facility]. I provided him their phone number: [phone]. He verbalized understanding and appreciation.

## 2022-06-28 NOTE — Telephone Encounter (Signed)
Pt LVM on my phone, did not say what his concerns were but looks like he has recently seen Debbora Presto, NP - Please return patient call when able - Thanks!

## 2022-07-22 DIAGNOSIS — M25551 Pain in right hip: Secondary | ICD-10-CM | POA: Diagnosis not present

## 2022-07-22 DIAGNOSIS — M5416 Radiculopathy, lumbar region: Secondary | ICD-10-CM | POA: Diagnosis not present

## 2022-07-22 DIAGNOSIS — M25561 Pain in right knee: Secondary | ICD-10-CM | POA: Diagnosis not present

## 2022-07-27 DIAGNOSIS — F900 Attention-deficit hyperactivity disorder, predominantly inattentive type: Secondary | ICD-10-CM | POA: Diagnosis not present

## 2022-07-27 DIAGNOSIS — G47419 Narcolepsy without cataplexy: Secondary | ICD-10-CM | POA: Diagnosis not present

## 2022-07-27 DIAGNOSIS — F3342 Major depressive disorder, recurrent, in full remission: Secondary | ICD-10-CM | POA: Diagnosis not present

## 2022-07-27 DIAGNOSIS — F411 Generalized anxiety disorder: Secondary | ICD-10-CM | POA: Diagnosis not present

## 2022-08-06 DIAGNOSIS — M5416 Radiculopathy, lumbar region: Secondary | ICD-10-CM | POA: Diagnosis not present

## 2022-09-09 DIAGNOSIS — M5416 Radiculopathy, lumbar region: Secondary | ICD-10-CM | POA: Diagnosis not present

## 2022-09-09 DIAGNOSIS — M25551 Pain in right hip: Secondary | ICD-10-CM | POA: Diagnosis not present

## 2022-09-09 DIAGNOSIS — Z6833 Body mass index (BMI) 33.0-33.9, adult: Secondary | ICD-10-CM | POA: Diagnosis not present

## 2022-09-09 DIAGNOSIS — M25561 Pain in right knee: Secondary | ICD-10-CM | POA: Diagnosis not present

## 2022-10-07 DIAGNOSIS — M5416 Radiculopathy, lumbar region: Secondary | ICD-10-CM | POA: Diagnosis not present

## 2022-10-07 DIAGNOSIS — M25551 Pain in right hip: Secondary | ICD-10-CM | POA: Diagnosis not present

## 2022-10-12 DIAGNOSIS — D692 Other nonthrombocytopenic purpura: Secondary | ICD-10-CM | POA: Diagnosis not present

## 2022-10-12 DIAGNOSIS — L309 Dermatitis, unspecified: Secondary | ICD-10-CM | POA: Diagnosis not present

## 2022-10-12 DIAGNOSIS — L812 Freckles: Secondary | ICD-10-CM | POA: Diagnosis not present

## 2022-10-12 DIAGNOSIS — D225 Melanocytic nevi of trunk: Secondary | ICD-10-CM | POA: Diagnosis not present

## 2022-10-12 DIAGNOSIS — L281 Prurigo nodularis: Secondary | ICD-10-CM | POA: Diagnosis not present

## 2022-10-12 DIAGNOSIS — L918 Other hypertrophic disorders of the skin: Secondary | ICD-10-CM | POA: Diagnosis not present

## 2022-10-18 ENCOUNTER — Encounter: Payer: Self-pay | Admitting: Neurology

## 2022-10-18 ENCOUNTER — Ambulatory Visit (INDEPENDENT_AMBULATORY_CARE_PROVIDER_SITE_OTHER): Payer: Medicare Other | Admitting: Neurology

## 2022-10-18 VITALS — BP 135/64 | HR 55 | Ht 71.0 in | Wt 242.2 lb

## 2022-10-18 DIAGNOSIS — G4739 Other sleep apnea: Secondary | ICD-10-CM | POA: Diagnosis not present

## 2022-10-18 DIAGNOSIS — L4050 Arthropathic psoriasis, unspecified: Secondary | ICD-10-CM | POA: Diagnosis not present

## 2022-10-18 NOTE — Progress Notes (Signed)
Provider:  Melvyn Novas, MD  Primary Care Physician:  Irena Reichmann, DO [address] [place] [address]     Referring Provider:  Dr Terrace Arabia , MD PHD         Chief Complaint according to patient   Patient presents with:     New Patient (Initial Visit)           HISTORY OF PRESENT ILLNESS:  John [name] is a 67 y.o. male patient who is here for revisit 10/18/2022 for  CPAP follow up, He has been a sleep patient here since 06-2021, referred by Dr Terrace Arabia and Dr Thomasena Edis. .  Chief concern according to patient :  I have high residual apneas. - the patient reports air leaks, has changed from pillows to nasal mask and yet has still air-leaks.  He felt the nasal pillows fit better. I am meeting for a refitting?  We used a N 20 medium instead of large-  it fits him better and I will change future DME orders to medium nasal mask.   He has a high number of centrals arising under this therapy, 6/h versus 3/h of OSA.  When last seen by Amy, he had a residual AHI of 7.3/h and central AI was 4.4 and OSA AHI was 1.8  He reports having back and hip pain each night and is on a 7 day pain patch.  This is most likely cause of central apnea, but he only started opiate therapy in March 2024, after he was paralyzed by back pain, and pain between groin and knee.  Treated by [facility] Dr Belva Bertin, MRI confirmed back DDD,  No history of renal, heart or liver failure .  This patient is 100% compliant by days and has used the machine 28 out of 30 days over 4 hours consecutively.  Only on 2 days that he have less than 4 hours of use and this was well into the 3-hour range.  The average user time each day is 6 hours 48 minutes.  The settings are still between 8 and 16 cmH2O with 3 cm EPR, his 95th percentile pressure is 12.8 cmH2O his residual AHI was 11/h the air leaks have reached the 95th percentile of 15 L a minute which is moderate.  His apnea index is 9.7 and divided  between central apneas 6.3/h and obstructive apneas 3.1/h.  We are trying to eliminate the central apneas further.  Goal is an AHI of 5 or below.      Plan : I will invite him to an in lab titration, to help eliminating central apnea.      Dr Terrace Arabia: CVA, memory loss Dr Reesha Debes: sleep     HISTORY OF PRESENT ILLNESS:   04/15/22 ALL:  John [name] is a 67 y.o. male patient primarily of Dr Zannie Cove.  here today for follow up for OSA on CPAP and memory loss. He is doing well on CPAP therapy. He is using therapy nightly for about 7 hours. He denies concerns with machine or supplies. He is using nasal pillow mask. He does sleep on his back. Split night showed AHI 3.3/h on 14cmH20.    Memory is stable No significant changes. He is able to perform ADLs independently. He drives without difficulty. He helps manage home and finances. Wife reports he has ADD.    He reports difficulty initiating sleep. He also has difficulty staying awake during the day. He admits  that he doesn't have a consistent bed time. He likes to watch TV and sometimes plays on tablet in bed. He may get in bed at 8:30 or it may be 3am. He does drink soda. He has taken melatonin 20mg  at bedtime in past but felt that it caused him to feel groggy the next day. He continues bupropion and modafinil prescribed by psychiatry.    He is followed regularly by PCP. He continues asa 81mg  and simvastatin 10mg  daily.       HISTORY: (copied from Dr Riddik Senna's previous note)   John [name] is a 67 y.o. Caucasian male patient of Dr Terrace Arabia , here today for a CPAP follow up after HST in March 2023.  John [name] underwent a split-night titration in lab and this was taken place on 07 July 2021.  He reached an apnea hypopnea index of 44.5/h in supine sleep and nonsupine sleep the results were almost the same.  He did only sleep for about 44% of the baseline time probably because he is CPAP dependent.  So the patient was split at 6 cm water and put  step-by-step up to a final of 14 cm water pressure.  Here his apnea hypopnea index was reduced to 3.3/h and his sleep efficiency was 95%. He was finished with a auto titration CPAP between 8 and 16 cm water pressure with 3 cm expiratory pressure relief humidification and he uses his preferred medium sized P10 nasal pillow by [employer]. He is using CPAP without water- but has a heated tube. He is followed by [facility].     Review of Systems: Out of a complete 14 system review, the patient complains of only the following symptoms, and all other reviewed systems are negative.:  Fatigue, sleepiness ,    How likely are you to doze in the following situations: 0 = not likely, 1 = slight chance, 2 = moderate chance, 3 = high chance   Sitting and Reading? Watching Television? Sitting inactive in a public place (theater or meeting)? As a passenger in a car for an hour without a break? Lying down in the afternoon when circumstances permit? Sitting and talking to someone? Sitting quietly after lunch without alcohol? In a car, while stopped for a few minutes in traffic?   Total = 12/ 24 points   FSS endorsed at 16/ 63 points.   Social History   Socioeconomic History   Marital status: Married    Spouse name: John [name]   Number of children: 1   Years of education: 12   Highest education level: High school graduate  Occupational History   Occupation: Retired  Tobacco Use   Smoking status: Former    Types: Cigarettes    Quit date: 07/2000    Years since quitting: 22.2   Smokeless tobacco: Never  Vaping Use   Vaping Use: Never used  Substance and Sexual Activity   Alcohol use: Yes    Comment: one drink every 4 weeks   Drug use: Never   Sexual activity: Yes    Birth control/protection: None  Other Topics Concern   Not on file  Social History Narrative   Lives with wife.   Right-handed.   No daily caffeine use   Social Determinants of Corporate investment banker Strain: Not on file   Food Insecurity: Not on file  Transportation Needs: Not on file  Physical Activity: Not on file  Stress: Not on file  Social Connections: Not on file    Family  History  Problem Relation Age of Onset   COPD Mother        died at 35   Alzheimer's disease Mother    Diabetes Father        died at 21   Alzheimer's disease Father    Early death Paternal Aunt     Past Medical History:  Diagnosis Date   Depression    Hyperlipemia    Hypertension    Memory loss    Psoriatic arthritis (HCC)     Past Surgical History:  Procedure Laterality Date   TONSILLECTOMY     VIDEO ASSISTED THORACOSCOPY (VATS)/EMPYEMA Right 05/13/2016   Procedure: VIDEO ASSISTED THORACOSCOPY (VATS)/DRAIN EMPYEMA;  Surgeon: Alleen Borne, MD;  Location: [facility] OR;  Service: Thoracic;  Laterality: Right;     Current Outpatient Medications on File Prior to Visit  Medication Sig Dispense Refill   aspirin 81 MG chewable tablet Chew 1 tablet by mouth daily.     buPROPion (WELLBUTRIN XL) 300 MG 24 hr tablet Take 300 mg by mouth daily.     carvedilol (COREG) 3.125 MG tablet Take 3.125 mg by mouth 2 (two) times daily with a meal.     Cinnamon 500 MG capsule Take 500 mg by mouth daily.     citalopram (CELEXA) 20 MG tablet Take 20 mg by mouth daily.     fish oil-omega-3 fatty acids 1000 MG capsule Take 2 g by mouth in the morning and at bedtime.      gabapentin (NEURONTIN) 300 MG capsule Take 300 mg by mouth at bedtime.     Ginkgo 60 MG TABS Take 1 tablet by mouth daily.     GINSENG EXTRACT PO Take 400 mg by mouth daily.     Glucosamine 500 MG CAPS Take 3 capsules by mouth 2 (two) times daily.     losartan (COZAAR) 25 MG tablet Take 25 mg by mouth daily.     modafinil (PROVIGIL) 200 MG tablet Take 200 mg by mouth daily.     Multiple Vitamin (MULTIVITAMIN) capsule Take 1 capsule by mouth daily.     simvastatin (ZOCOR) 10 MG tablet Take 10 mg by mouth at bedtime.     tamsulosin (FLOMAX) 0.4 MG CAPS capsule Take 0.4 mg by  mouth daily.     No current facility-administered medications on file prior to visit.    Allergies  Allergen Reactions   Clindamycin    Penicillin G     Other Reaction(s): Unknown   Clindamycin Hcl Rash   Clindamycin/Lincomycin Rash   Meropenem Rash   Penicillins Rash    ++ pt's wife reported he tolerated keflex in the past++ Has patient had a PCN reaction causing immediate rash, facial/tongue/throat swelling, SOB or lightheadedness with hypotension: No Has patient had a PCN reaction causing severe rash involving mucus membranes or skin necrosis: No Has patient had a PCN reaction that required hospitalization No Has patient had a PCN reaction occurring within the last 10 years: No If all of the above answers are "NO", then may proceed with Cephalosporin use.     DIAGNOSTIC DATA (LABS, IMAGING, TESTING) - I reviewed patient records, labs, notes, testing and imaging myself where available.  Lab Results  Component Value Date   WBC 10.4 05/19/2016   HGB 9.0 (L) 05/19/2016   HCT 29.0 (L) 05/19/2016   MCV 89.8 05/19/2016   PLT 328 05/19/2016      Component Value Date/Time   NA 137 05/17/2016 0500   K 4.2  05/17/2016 0500   CL 105 05/17/2016 0500   CO2 26 05/17/2016 0500   GLUCOSE 130 (H) 05/17/2016 0500   BUN 17 05/17/2016 0500   CREATININE 1.08 05/17/2016 0500   CALCIUM 8.2 (L) 05/17/2016 0500   PROT 4.9 (L) 05/15/2016 0435   ALBUMIN 1.7 (L) 05/15/2016 0435   AST 55 (H) 05/15/2016 0435   ALT 74 (H) 05/15/2016 0435   ALKPHOS 98 05/15/2016 0435   BILITOT 0.3 05/15/2016 0435   GFRNONAA >60 05/17/2016 0500   GFRAA >60 05/17/2016 0500   No results found for: "CHOL", "HDL", "LDLCALC", "LDLDIRECT", "TRIG", "CHOLHDL" No results found for: "HGBA1C" No results found for: "VITAMINB12" Lab Results  Component Value Date   TSH 0.690 05/11/2016    PHYSICAL EXAM:  Today's Vitals   10/18/22 1015 10/18/22 1026  BP: (!) 141/67 135/64  Pulse: (!) 55   Weight: 242 lb 3.2 oz  (109.9 kg)   Height: 5\' 11"  (1.803 m)    Body mass index is 33.78 kg/m.   Wt Readings from Last 3 Encounters:  10/18/22 242 lb 3.2 oz (109.9 kg)  04/15/22 237 lb 8 oz (107.7 kg)  10/07/21 239 lb (108.4 kg)     Ht Readings from Last 3 Encounters:  10/18/22 5\' 11"  (1.803 m)  04/15/22 5\' 10"  (1.778 m)  10/07/21 5\' 6"  (1.676 m)      General: The patient is awake, alert and appears not in acute distress. The patient is well groomed. Head: Normocephalic, atraumatic. Neck is supple. Mallampati 3,  neck circumference:19 inches . Nasal airflow  patent.  Retrognathia is seen.  He is a mouth breather.  Dental status: crowded lower .  Cardiovascular:  Regular rate and cardiac rhythm by pulse,  without distended neck veins. Respiratory: Lungs are clear to auscultation.  Skin:  Without evidence of ankle edema, or rash. Trunk: The patient's posture is erect.   Neurologic exam : The patient is awake and alert, oriented to place and time.   Memory subjective described as impaired - 2021 evaluation with dr Terrace Arabia.   Attention span & concentration ability appears restricted Speech is fluent,  without  dysarthria, mild dysphonia , no aphasia.  Mood and affect are appropriate.   Cranial nerves: no loss of smell or taste reported  Pupils are equal and briskly reactive to light. Funduscopic exam: deferred. .  Extraocular movements in vertical and horizontal planes were intact and without nystagmus. No Diplopia. Visual fields by finger perimetry are intact. Hearing was intact to soft voice and finger rubbing.    Facial sensation intact to fine touch.  Facial motor strength is symmetric and tongue and uvula move midline.  Neck ROM : rotation, tilt and flexion extension were normal for age and shoulder shrug was symmetrical.     ASSESSMENT AND PLAN 67 y.o. year old male patient of dr Catalina Gravel here with:    1) referred by Dr Terrace Arabia and Dr Thomasena Edis. .  Chief concern according to patient :  I have high  residual apneas. - the patient reports air leaks, has changed from pillows to nasal mask and yet has still air-leaks.  He felt the nasal pillows fit better. I am meeting for a refitting?  We used a N 20 medium instead of large-  it fits him better and I will change future DME orders to medium nasal mask.   He has a high number of centrals arising under this therapy, 6/h versus 3/h of OSA.  When last seen by Amy,  he had a residual AHI of 7.3/h and central AI was 4.4 and OSA AHI was 1.8  He reports having back and hip pain each night and is on a 7 day pain patch.  This is most likely cause of central apnea, but he only started opiate therapy in March 2024, after he was paralyzed by back pain, and pain between groin and knee.   Treated by [facility] Dr Belva Bertin, MRI confirmed back DDD,  No history of renal, heart or liver failure .    Plan : I will invite him to an in lab titration, to help eliminating central apnea. He is by no means a candidate for [facility]-  In the mean time, continue to use CPAP with the new medium sized N20 mask by [employer].  I plan to follow up either personally or through our NP within 6 months.   I would like to thank Dr Terrace Arabia  for allowing me to meet with and to take care of this pleasant patient.    After spending a total time of  26  minutes face to face and additional time for physical and neurologic examination, review of laboratory studies,  personal review of imaging studies, reports and results of other testing and review of referral information / records as far as provided in visit,   Electronically signed by: Melvyn Novas, MD 10/18/2022 10:33 AM  [facility] Board certified by The [employer] of Sleep Medicine and Diplomate of the [employer] of Sleep Medicine. Board certified In Neurology through the ABPN, Fellow of the [employer] of Neu[employer].

## 2022-10-18 NOTE — Patient Instructions (Signed)
    This patient is 100% compliant by days and has used the machine 28 out of 30 days over 4 hours consecutively.  Only on 2 days that he have less than 4 hours of use and this was well into the 3-hour range.    The average user time each day is 6 hours 48 minutes.  The settings are still between 8 and 16 cmH2O with 3 cm EPR, his 95th percentile pressure is 12.8 cmH2O his residual AHI was 11/h the air leaks have reached the 95th percentile of 15 L a minute which is moderate.  His apnea index is 9.7 and divided between central apneas 6.3/h and obstructive apneas 3.1/h.   We are trying to eliminate the central apneas further.  Goal is an AHI of 5 or below.

## 2022-10-19 DIAGNOSIS — R7989 Other specified abnormal findings of blood chemistry: Secondary | ICD-10-CM | POA: Diagnosis not present

## 2022-10-19 DIAGNOSIS — Z125 Encounter for screening for malignant neoplasm of prostate: Secondary | ICD-10-CM | POA: Diagnosis not present

## 2022-10-19 DIAGNOSIS — I10 Essential (primary) hypertension: Secondary | ICD-10-CM | POA: Diagnosis not present

## 2022-10-19 DIAGNOSIS — Z862 Personal history of diseases of the blood and blood-forming organs and certain disorders involving the immune mechanism: Secondary | ICD-10-CM | POA: Diagnosis not present

## 2022-10-19 DIAGNOSIS — E785 Hyperlipidemia, unspecified: Secondary | ICD-10-CM | POA: Diagnosis not present

## 2022-10-20 DIAGNOSIS — E291 Testicular hypofunction: Secondary | ICD-10-CM | POA: Diagnosis not present

## 2022-10-20 DIAGNOSIS — E785 Hyperlipidemia, unspecified: Secondary | ICD-10-CM | POA: Diagnosis not present

## 2022-10-26 DIAGNOSIS — D649 Anemia, unspecified: Secondary | ICD-10-CM | POA: Diagnosis not present

## 2022-10-26 DIAGNOSIS — E785 Hyperlipidemia, unspecified: Secondary | ICD-10-CM | POA: Diagnosis not present

## 2022-10-26 DIAGNOSIS — M25552 Pain in left hip: Secondary | ICD-10-CM | POA: Diagnosis not present

## 2022-10-26 DIAGNOSIS — M4316 Spondylolisthesis, lumbar region: Secondary | ICD-10-CM | POA: Diagnosis not present

## 2022-10-26 DIAGNOSIS — I10 Essential (primary) hypertension: Secondary | ICD-10-CM | POA: Diagnosis not present

## 2022-10-26 DIAGNOSIS — R269 Unspecified abnormalities of gait and mobility: Secondary | ICD-10-CM | POA: Diagnosis not present

## 2022-11-01 DIAGNOSIS — M5416 Radiculopathy, lumbar region: Secondary | ICD-10-CM | POA: Diagnosis not present

## 2022-11-01 DIAGNOSIS — M4316 Spondylolisthesis, lumbar region: Secondary | ICD-10-CM | POA: Diagnosis not present

## 2022-11-04 DIAGNOSIS — M4316 Spondylolisthesis, lumbar region: Secondary | ICD-10-CM | POA: Diagnosis not present

## 2022-11-04 DIAGNOSIS — R293 Abnormal posture: Secondary | ICD-10-CM | POA: Diagnosis not present

## 2022-11-04 DIAGNOSIS — M2569 Stiffness of other specified joint, not elsewhere classified: Secondary | ICD-10-CM | POA: Diagnosis not present

## 2022-11-04 DIAGNOSIS — M6281 Muscle weakness (generalized): Secondary | ICD-10-CM | POA: Diagnosis not present

## 2022-11-04 DIAGNOSIS — M1610 Unilateral primary osteoarthritis, unspecified hip: Secondary | ICD-10-CM | POA: Diagnosis not present

## 2022-11-08 ENCOUNTER — Telehealth: Payer: Self-pay | Admitting: Neurology

## 2022-11-08 DIAGNOSIS — M6281 Muscle weakness (generalized): Secondary | ICD-10-CM | POA: Diagnosis not present

## 2022-11-08 DIAGNOSIS — R293 Abnormal posture: Secondary | ICD-10-CM | POA: Diagnosis not present

## 2022-11-08 DIAGNOSIS — M4316 Spondylolisthesis, lumbar region: Secondary | ICD-10-CM | POA: Diagnosis not present

## 2022-11-08 DIAGNOSIS — M2569 Stiffness of other specified joint, not elsewhere classified: Secondary | ICD-10-CM | POA: Diagnosis not present

## 2022-11-08 DIAGNOSIS — M1610 Unilateral primary osteoarthritis, unspecified hip: Secondary | ICD-10-CM | POA: Diagnosis not present

## 2022-11-08 NOTE — Telephone Encounter (Signed)
CPAP- Medicare/AARP no auth req   Patient is scheduled at [facility] for 01/31/23 at 8 pm.  Mailed packet to the patient.

## 2022-11-10 DIAGNOSIS — F3341 Major depressive disorder, recurrent, in partial remission: Secondary | ICD-10-CM | POA: Diagnosis not present

## 2022-11-10 DIAGNOSIS — M5416 Radiculopathy, lumbar region: Secondary | ICD-10-CM | POA: Diagnosis not present

## 2022-11-11 DIAGNOSIS — M6281 Muscle weakness (generalized): Secondary | ICD-10-CM | POA: Diagnosis not present

## 2022-11-11 DIAGNOSIS — M2569 Stiffness of other specified joint, not elsewhere classified: Secondary | ICD-10-CM | POA: Diagnosis not present

## 2022-11-11 DIAGNOSIS — R293 Abnormal posture: Secondary | ICD-10-CM | POA: Diagnosis not present

## 2022-11-11 DIAGNOSIS — M1610 Unilateral primary osteoarthritis, unspecified hip: Secondary | ICD-10-CM | POA: Diagnosis not present

## 2022-11-11 DIAGNOSIS — M4316 Spondylolisthesis, lumbar region: Secondary | ICD-10-CM | POA: Diagnosis not present

## 2022-11-15 DIAGNOSIS — M6281 Muscle weakness (generalized): Secondary | ICD-10-CM | POA: Diagnosis not present

## 2022-11-15 DIAGNOSIS — R293 Abnormal posture: Secondary | ICD-10-CM | POA: Diagnosis not present

## 2022-11-15 DIAGNOSIS — M2569 Stiffness of other specified joint, not elsewhere classified: Secondary | ICD-10-CM | POA: Diagnosis not present

## 2022-11-15 DIAGNOSIS — M1610 Unilateral primary osteoarthritis, unspecified hip: Secondary | ICD-10-CM | POA: Diagnosis not present

## 2022-11-15 DIAGNOSIS — M4316 Spondylolisthesis, lumbar region: Secondary | ICD-10-CM | POA: Diagnosis not present

## 2022-11-18 DIAGNOSIS — D649 Anemia, unspecified: Secondary | ICD-10-CM | POA: Diagnosis not present

## 2022-11-30 DIAGNOSIS — D649 Anemia, unspecified: Secondary | ICD-10-CM | POA: Diagnosis not present

## 2022-11-30 DIAGNOSIS — I10 Essential (primary) hypertension: Secondary | ICD-10-CM | POA: Diagnosis not present

## 2022-11-30 DIAGNOSIS — M4316 Spondylolisthesis, lumbar region: Secondary | ICD-10-CM | POA: Diagnosis not present

## 2022-11-30 DIAGNOSIS — E785 Hyperlipidemia, unspecified: Secondary | ICD-10-CM | POA: Diagnosis not present

## 2022-11-30 DIAGNOSIS — R7309 Other abnormal glucose: Secondary | ICD-10-CM | POA: Diagnosis not present

## 2022-11-30 DIAGNOSIS — Z79899 Other long term (current) drug therapy: Secondary | ICD-10-CM | POA: Diagnosis not present

## 2022-12-20 DIAGNOSIS — M5416 Radiculopathy, lumbar region: Secondary | ICD-10-CM | POA: Diagnosis not present

## 2022-12-23 NOTE — Telephone Encounter (Signed)
I spoke with the patient and offered him the cancellation I had for 12/28/22 at 9 pm he stated that he would be here.

## 2022-12-28 ENCOUNTER — Ambulatory Visit (INDEPENDENT_AMBULATORY_CARE_PROVIDER_SITE_OTHER): Payer: Medicare Other | Admitting: Neurology

## 2022-12-28 DIAGNOSIS — G4739 Other sleep apnea: Secondary | ICD-10-CM | POA: Diagnosis not present

## 2022-12-28 DIAGNOSIS — Z9989 Dependence on other enabling machines and devices: Secondary | ICD-10-CM

## 2022-12-28 DIAGNOSIS — J90 Pleural effusion, not elsewhere classified: Secondary | ICD-10-CM

## 2023-01-07 DIAGNOSIS — M5416 Radiculopathy, lumbar region: Secondary | ICD-10-CM | POA: Diagnosis not present

## 2023-01-07 DIAGNOSIS — M961 Postlaminectomy syndrome, not elsewhere classified: Secondary | ICD-10-CM | POA: Diagnosis not present

## 2023-01-08 NOTE — Procedures (Signed)
[facility] PAP TITRATION INTERPRETATION REPORT   STUDY DATE: 12/28/2022      PATIENT NAME:  John [name]         DATE OF BIRTH:  Jul 21, 1955  PATIENT ID:  [id number]    TYPE OF STUDY:  CPAP  READING PHYSICIAN: Melvyn Novas, MD SCORING TECHNICIAN: Margaretann Loveless, RPSGT   HISTORY: 10-18-2022. Amy Lomax referred patient for re-evaluation: This 67 year-old Male patient of Dr Zannie Cove has been tested for OSA( AHI of 44.5/h)  in 07-07-2021, SPLIT night protocol-  and was placed on CPAP. He has a chronic pain condition affecting his sleep and likely PAP tolerance. Higher than desired residual apneas, the proportion of central apneas was higher than OSA. AHI was 7.3/h - pain patch was likely the main cause of central apneas.  The Epworth Sleepiness Scale was endorsed at 12 out of 24 (scores above or equal to 10 are suggestive of hypersomnolence). FSS at na/63 points.  Here for retitration.    DESCRIPTION: A sleep technologist was in attendance for the duration of the recording.  Data collection, scoring, video monitoring, and reporting were performed in compliance with the AASM Manual for the Scoring of Sleep and Associated Events; (Hypopnea is scored based on the criteria listed in Section VIII D. 1b in the AASM Manual V2.6 using a 4% oxygen desaturation rule or Hypopnea is scored based on the criteria listed in Section VIII D. 1a in the AASM Manual V2.6 using 3% oxygen desaturation and /or arousal rule).  A physician certified by the American Board of Sleep Medicine reviewed each epoch of the study.  ADDITIONAL INFORMATION:  Height: 71.0 in Weight: 242 lbs (BMI 33) Neck Size: 19.0 in    MEDICATIONS: Aspirin, Wellbutrin XL, Coreg, Cinnamon, Celexa, Fish Oil, Gabapentin, Ginkgo, Ginseng, Glucosamine, Cozaar, Provigil, Multivitamin, Zocor, Flomax, pain patch.   SLEEP CONTINUITY AND SLEEP ARCHITECTURE:  Lights off was at 22:04: and lights on 04:18: (6.2 hours in  bed).  Total sleep time was 56.0 minutes (100.0% supine;  0.0% lateral;  0.0% prone, 0.0% REM sleep), with a decreased sleep efficiency at 15.0%. Sleep latency was increased at 47.0 minutes.  Of the total sleep time, the percentage of stage N1 sleep was 43.8%, stage N2 sleep was 56.3%, stage N3 sleep was 0.0%, and REM sleep was 0.0% observed on this study.John [name] after sleep onset (WASO) time accounted for 270 minutes.  BODY POSITION: Duration of total sleep and percent of total sleep in their respective position is as follows: supine 56 minutes (100.0%),   RESPIRATORY MONITORING Based on AASM criteria (using a 3% oxygen desaturation and /or arousal rule for scoring hypopneas), there were 2 apneas (0 obstructive; 2 central; 0 mixed), and 33 hypopneas.  Apnea index was 2.1. Hypopnea index was 35.4. The apnea-hypopnea index was 37.5 overall (37.5 supine, 0.0 non-supine; 0.0 REM, 0.0 supine REM). There were 0 respiratory effort-related arousals (RERAs).   OXIMETRY: Respiratory events were associated with oxyhemoglobin desaturations (nadir during sleep 86% from a mean of 93%. Total sleep time spent at, or below 88% was 1.0 minutes, or 1.8% of total sleep time. Snoring was classified as mild. EKG.: normal sinus rhythm. The average heart rate during sleep was 55 bpm.  The maximum heart rate during sleep was 60 bpm. The maximum heart rate during recording was 86.    LIMB MOVEMENTS: There were 23 periodic limb movements of sleep (24.6/h), of which 1 (1.1/h) were associated with an arousal. AROUSAL: There  were 75 arousals in total, for an arousal index of 69.6 arousals/hour.  Of these, 22 were identified as respiratory-related arousals (23.6 /h), 1 were PLM-related arousals (1.1 /h), and 53 were non-specific arousals (56.8 /h).    IMPRESSION:   1. Severe sleep apnea was noted, in form of sleep hypopneas. There were very few frank apneas and these were central apneas.  2. Total sleep time was severely  reduced. The patient did not bring his nighttime medication to the sleep lab as instructed.  The AHI and overall quality of  sleep did not change with positive airway pressure at any explored pressure.    3. Arousal from sleep for a bathroom break was followed by period of wakefulness in which the patient used a tabloid device, screen light, etc. No sleep was recorded after that.          RECOMMENDATIONS: 1) the primary sleep disorder seen here was INSOMNIA, attributed to  inadequate medication control of pain. 2) CPAP is not controlling the patients apneas as seen here, but his control at home was much better- AHI reduced to 11/h and 95% was 13 cm water with 3 cm EPR . Your apnea is still 75% reduced in comparison to baseline.   Please continue to use CPAP auto as it works much better at home and under adequate pain medication than here in the Sleep laboratory:  The sleep study was of limited validity due to missing the night time medication.     Melvyn Novas, MD            [facility] CPAP Summary    General Information  Name: John [name], John [name] BMI: 33.75 Physician: Melvyn Novas, MD  ID: [id number] Height: 71.0 in Technician: Margaretann Loveless, RPSGT  Sex: Male Weight: 242.0 lb Record: [id number]  Age: 49 [06/22/1955] Date: 12/28/2022     Medical & Medication History    John [name] is a 67 y.o. male patient who is here for revisit 10/18/2022 for CPAP follow up, He has been a sleep patient here since 06-2021, referred by Dr Terrace Arabia and Dr Thomasena Edis. . Chief concern according to patient : I have high residual apneas. - the patient reports air leaks, has changed from pillows to nasal mask and yet has still air-leaks. He felt the nasal pillows fit better. I am meeting for a refitting? We used a N 20 medium instead of large- it fits him better and I will change future DME orders to medium nasal mask. He has a high number of centrals arising under this  therapy, 6/h versus 3/h of OSA. When last seen by Amy, he had a residual AHI of 7.3/h and central AI was 4.4 and OSA AHI was 1.8. He reports having back and hip pain each night and is on a 7 day pain patch. This is most likely cause of central apnea, but he only started opiate therapy in March 2024, after he was paralyzed by back pain, and pain between groin and knee. Treated by [facility] Dr Belva Bertin, MRI confirmed back DDD, No history of renal, heart or liver failure. This patient is 100% compliant by days and has used the machine 28 out of 30 days over 4 hours consecutively. Only on 2 days that he have less than 4 hours of use and this was well into the 3-hour range. The average user time each day is 6 hours 48 minutes. The settings are still between 8 and 16 cmH2O with 3 cm  EPR, his 95th percentile pressure is 12.8 cmH2O his residual AHI was 11/h the air leaks have reached the 95th percentile of 15 L a minute which is moderate. His apnea index is 9.7 and divided between central apneas 6.3/h and obstructive apneas 3.1/h. We are trying to eliminate the central apneas further. Goal is an AHI of 5 or below. The total APNEA/HYPOPNEA INDEX (AHI) was 44.5 /hour and all 122 events in NREM. The patient spent 240 minutes sleep time in the supine position 68 minutes in non-supine. The supine AHI was 45.0 /hour versus a non-supine AHI of 40.0 /hour.  Aspirin, Wellbutrin XL, Coreg, Cinnamon, Celexa, Fish Oil, Gabapentin, Ginkgo, Ginseng, Glucosamine, Cozaar, Provigil, Multivitamin, Zocor, Flomax   Sleep Disorder      Comments   The patient came into the sleep lab for a CPAP titration study. The patient had a split night study with our sleep lab on 07/07/21. The total AHI was 44.5/hour. Per the patient he forgot his Gabapentin at home, and he is not currently using the pain patch due to burning of the skin at application site. The patient was fitted with F&P Evora and Vitera (FFM). Interfaces started to leak  due to patient having so much movement. Tech switched patient to his home mask which is a ResMed N20 (Nasal) size small. CPAP was started at [facility] with no EPR. CPAP was increased to 13cmH2O with EPR of 2 due to respiratory events. One restroom break. EKG did not show any obvious cardiac arrhythmias. Mild snoring. Respiratory events scored with a 4% desat. PLM's noted. The patient only slept supine. The patient had very fragmented sleep. Sleep efficiency was.. During periods of wake the patient would get on his tablet. The patient had very few CSA therefore, he was not switched to BiPAP ST. The patient did not reach 2 hrs of TST. The patient did not return to sleep after bathroom trip. No REM sleep.    CPAP start time: 10:05:07 PM CPAP end time: 04:18:03 AM   Time Total Supine Side Prone Upright  Recording (TRT) 6h 13.68m 6h 13.2m 0h 0.20m 0h 0.96m 0h 0.63m  Sleep (TST) 0h 56.19m 0h 56.9m 0h 0.41m 0h 0.56m 0h 0.48m   Latency N1 N2 N3 REM Onset Per. Slp. Eff.  Actual 0h 46.64m 0h 52.37m 0h 0.48m 0h 0.41m 0h 46.82m 0h 0.56m 15.01%   Stg Dur Wake N1 N2 N3 REM  Total 206.0 24.5 31.5 0.0 0.0  Supine 206.0 24.5 31.5 0.0 0.0  Side 0.0 0.0 0.0 0.0 0.0  Prone 0.0 0.0 0.0 0.0 0.0  Upright 0.0 0.0 0.0 0.0 0.0   Stg % Wake N1 N2 N3 REM  Total 78.6 43.8 56.3 0.0 0.0  Supine 78.6 43.8 56.3 0.0 0.0  Side 0.0 0.0 0.0 0.0 0.0  Prone 0.0 0.0 0.0 0.0 0.0  Upright 0.0 0.0 0.0 0.0 0.0     Apnea Summary Sub Supine Side Prone Upright  Total 2 Total 2 2 0 0 0    REM 0 0 0 0 0    NREM 2 2 0 0 0  Obs 0 REM 0 0 0 0 0    NREM 0 0 0 0 0  Mix 0 REM 0 0 0 0 0    NREM 0 0 0 0 0  Cen 2 REM 0 0 0 0 0    NREM 2 2 0 0 0   Rera Summary Sub Supine Side Prone Upright  Total 0 Total 0 0 0  0 0    REM 0 0 0 0 0    NREM 0 0 0 0 0   Hypopnea Summary Sub Supine Side Prone Upright  Total 33 Total 33 33 0 0 0    REM 0 0 0 0 0    NREM 33 33 0 0 0   4% Hypopnea Summary Sub Supine Side Prone Upright  Total (4%) 33 Total 33 33 0 0 0     REM 0 0 0 0 0    NREM 33 33 0 0 0     AHI Total Obs Mix Cen  37.50 Apnea 2.14 0.00 0.00 2.14   Hypopnea 35.36 -- -- --  37.50 Hypopnea (4%) 35.36 -- -- --    Total Supine Side Prone Upright  Position AHI 37.50 37.50 0.00 0.00 0.00  REM AHI 0.00   NREM AHI 37.50   Position RDI 37.50 37.50 0.00 0.00 0.00  REM RDI 0.00   NREM RDI 37.50    4% Hypopnea Total Supine Side Prone Upright  Position AHI (4%) 37.50 37.50 0.00 0.00 0.00  REM AHI (4%) 0.00   NREM AHI (4%) 37.50   Position RDI (4%) 37.50 37.50 0.00 0.00 0.00  REM RDI (4%) 0.00   NREM RDI (4%) 37.50    Desaturation Information  <100% <90% <80% <70% <60% <50% <40%  Supine 74 17 0 0 0 0 0  Side 0 0 0 0 0 0 0  Prone 0 0 0 0 0 0 0  Upright 0 0 0 0 0 0 0  Total 74 17 0 0 0 0 0  Desaturation threshold setting: 4% Minimum desaturation setting: 10 seconds SaO2 nadir: 86% The longest event was a 40 sec obstructive Hypopnea with a minimum SaO2 of 92%. The lowest SaO2 was 86% associated with a 24 sec obstructive Hypopnea. EKG Rates EKG Avg Max Min  Awake 56 86 51  Asleep 55 60 52  EKG Events: N/A Awakening/Arousal Information # of Awakenings 24  Wake after sleep onset 270.42m  Wake after persistent sleep 0.37m   Arousal Assoc. Arousals Index  Apneas 1 1.1  Hypopneas 21 22.5  Leg Movements 2 2.1  Snore 0.0 0.0  PTT Arousals 0 0.0  Spontaneous 53 56.8  Total 77 82.5  Myoclonus Information PLMS LMs Index  Total LMs during PLMS 23 24.6  LMs w/ Microarousals 1 1.1   LM LMs Index  w/ Microarousal 1 1.1  w/ Awakening 0 0.0  w/ Resp Event 0 0.0  Spontaneous 25 26.8  Total 26 27.9    PAP Report    General Information  Name: John [name], John [name] BMI: 33 Physician: ,   ID: [id number] Height: 71 in Technician: Margaretann Loveless  Sex: Male Weight: 242 lb Record: [id number]  Age: 74 [[birth year]] Date: 12/28/2022 Scorer: Margaretann Loveless    Recommended Settings  Protocol:   N/A  Device:   N/A  Mask:   N/A AHI:    N/A     Pressure Support:     N/A to   N/A cmH20   IPAP:     N/A to   N/A cmH20  EPAP:     N/A to   N/A cmH20      Max Pressure:     N/A  Backup Rate:     N/A  Humidity:     N/A AHI (4%):   N/A   Pressure Settings Phase THERAPY THERAPY THERAPY THERAPY THERAPY   Protocol - - - - -  Max Pressure - - - - -   IPAP 08 10 11 12 13    EPAP 08 10 11 12 13    PS - - - - -   Device - - - - -   Mask - - - - -   Backup Rate - - - - -   Humidity - - - - -  Time TRT 57.59m 100.61m 45.37m 53.78m 118.42m   TST 7.68m 14.100m 18.14m 10.28m 6.36m   Event Epoch 8 122 322 412 518  Sleep Stage % Wake 33.3 86.0 60.0 80.2 7.1   % REM 0.0 0.0 0.0 0.0 0.0   % N1 35.7 35.7 61.1 47.6 15.4   % N2 64.3 64.3 38.9 52.4 84.6   % N3 0.0 0.0 0.0 0.0 0.0  Respiratory Total Events 6 5 10 9 5    Obs. Apn. 0 0 0 0 0   Mixed Apn. 0 0 0 0 0   Cen. Apn. 0 0 2 0 0   Obs. Hyp. 6 5 8 9 5    Cen. Hyp. 0 0 0 0 0   AHI 51.43 21.43 33.33 51.43 46.15   Supine AHI 51.43 21.43 33.33 51.43 46.15   Prone AHI 0.00 0.00 0.00 0.00 0.00   Side AHI 0.00 0.00 0.00 0.00 0.00  Respiratory (4%) Obs. Hyp. (4%) 6.00 5.00 8.00 9.00 5.00   Cen. Hyp. (4%) 0.00 0.00 0.00 0.00 0.00   AHI (4%) 51.43 21.43 33.33 51.43 46.15   Supine AHI (4%) 51.43 21.43 33.33 51.43 46.15   Prone AHI (4%) 0.00 0.00 0.00 0.00 0.00   Side AHI (4%) 0.00 0.00 0.00 0.00 0.00  Desat Profile <= 90% 0.47m 0.53m 1.50m 2.29m 0.26m   <= 80% 0.16m 0.47m 0.5m 0.61m 0.12m   <= 70% 0.45m 0.82m 0.57m 0.64m 0.50m   <= 60% 0.21m 0.24m 0.83m 0.8m 0.32m  Arousal Index Apnea 0.0 0.0 3.3 0.0 0.0   Hypopnea 8.6 17.1 23.3 28.6 36.9   LM 0.0 0.0 6.7 0.0 0.0   Spontaneous 34.3 64.3 70.0 62.9 18.5

## 2023-01-11 ENCOUNTER — Telehealth: Payer: Self-pay

## 2023-01-11 NOTE — Telephone Encounter (Signed)
Contacted pt, informed him due to lack of sleep during this study, we were not able to get a accurate read. Per MD, Rec to continue with current settings at home. Pt verbally understood and was appreciative.

## 2023-01-11 NOTE — Telephone Encounter (Signed)
-----   Message from Carson Dohmeier sent at 01/08/2023  1:36 PM EDT ----- This study was a failure, very little sleep was recorded and the CPAP re-titration was unsuccessful - not enough sleep and not enough central apnea to switch to BiPAP which was the purpose of this study, nearly all events were hypopneas.  Patient forgot his night time pain meds and was not able to sleep, he was very restless. Rec to continue with current settings at home -as his residual AHI is around 10-/h and still a 75% eduction compared to baseline.

## 2023-01-19 DIAGNOSIS — H04203 Unspecified epiphora, bilateral lacrimal glands: Secondary | ICD-10-CM | POA: Diagnosis not present

## 2023-01-19 DIAGNOSIS — H2513 Age-related nuclear cataract, bilateral: Secondary | ICD-10-CM | POA: Diagnosis not present

## 2023-01-19 DIAGNOSIS — H53143 Visual discomfort, bilateral: Secondary | ICD-10-CM | POA: Diagnosis not present

## 2023-01-19 DIAGNOSIS — H5212 Myopia, left eye: Secondary | ICD-10-CM | POA: Diagnosis not present

## 2023-01-24 DIAGNOSIS — Z23 Encounter for immunization: Secondary | ICD-10-CM | POA: Diagnosis not present

## 2023-02-17 DIAGNOSIS — Z9689 Presence of other specified functional implants: Secondary | ICD-10-CM | POA: Diagnosis not present

## 2023-02-17 DIAGNOSIS — M961 Postlaminectomy syndrome, not elsewhere classified: Secondary | ICD-10-CM | POA: Diagnosis not present

## 2023-02-17 DIAGNOSIS — M25551 Pain in right hip: Secondary | ICD-10-CM | POA: Diagnosis not present

## 2023-02-17 DIAGNOSIS — M25561 Pain in right knee: Secondary | ICD-10-CM | POA: Diagnosis not present

## 2023-03-03 DIAGNOSIS — M25551 Pain in right hip: Secondary | ICD-10-CM | POA: Diagnosis not present

## 2023-03-03 DIAGNOSIS — M25552 Pain in left hip: Secondary | ICD-10-CM | POA: Diagnosis not present

## 2023-03-03 DIAGNOSIS — M6281 Muscle weakness (generalized): Secondary | ICD-10-CM | POA: Diagnosis not present

## 2023-03-03 DIAGNOSIS — M1610 Unilateral primary osteoarthritis, unspecified hip: Secondary | ICD-10-CM | POA: Diagnosis not present

## 2023-03-03 DIAGNOSIS — M4316 Spondylolisthesis, lumbar region: Secondary | ICD-10-CM | POA: Diagnosis not present

## 2023-03-08 DIAGNOSIS — M4316 Spondylolisthesis, lumbar region: Secondary | ICD-10-CM | POA: Diagnosis not present

## 2023-03-08 DIAGNOSIS — M25552 Pain in left hip: Secondary | ICD-10-CM | POA: Diagnosis not present

## 2023-03-08 DIAGNOSIS — M1610 Unilateral primary osteoarthritis, unspecified hip: Secondary | ICD-10-CM | POA: Diagnosis not present

## 2023-03-08 DIAGNOSIS — M6281 Muscle weakness (generalized): Secondary | ICD-10-CM | POA: Diagnosis not present

## 2023-03-08 DIAGNOSIS — M25551 Pain in right hip: Secondary | ICD-10-CM | POA: Diagnosis not present

## 2023-03-14 DIAGNOSIS — M25552 Pain in left hip: Secondary | ICD-10-CM | POA: Diagnosis not present

## 2023-03-14 DIAGNOSIS — M1610 Unilateral primary osteoarthritis, unspecified hip: Secondary | ICD-10-CM | POA: Diagnosis not present

## 2023-03-14 DIAGNOSIS — M6281 Muscle weakness (generalized): Secondary | ICD-10-CM | POA: Diagnosis not present

## 2023-03-14 DIAGNOSIS — M4316 Spondylolisthesis, lumbar region: Secondary | ICD-10-CM | POA: Diagnosis not present

## 2023-03-14 DIAGNOSIS — M25551 Pain in right hip: Secondary | ICD-10-CM | POA: Diagnosis not present

## 2023-03-17 DIAGNOSIS — M6281 Muscle weakness (generalized): Secondary | ICD-10-CM | POA: Diagnosis not present

## 2023-03-17 DIAGNOSIS — M25552 Pain in left hip: Secondary | ICD-10-CM | POA: Diagnosis not present

## 2023-03-17 DIAGNOSIS — M1610 Unilateral primary osteoarthritis, unspecified hip: Secondary | ICD-10-CM | POA: Diagnosis not present

## 2023-03-17 DIAGNOSIS — M4316 Spondylolisthesis, lumbar region: Secondary | ICD-10-CM | POA: Diagnosis not present

## 2023-03-17 DIAGNOSIS — M25551 Pain in right hip: Secondary | ICD-10-CM | POA: Diagnosis not present

## 2023-03-22 DIAGNOSIS — M6281 Muscle weakness (generalized): Secondary | ICD-10-CM | POA: Diagnosis not present

## 2023-03-22 DIAGNOSIS — M4316 Spondylolisthesis, lumbar region: Secondary | ICD-10-CM | POA: Diagnosis not present

## 2023-03-22 DIAGNOSIS — M25552 Pain in left hip: Secondary | ICD-10-CM | POA: Diagnosis not present

## 2023-03-22 DIAGNOSIS — M1610 Unilateral primary osteoarthritis, unspecified hip: Secondary | ICD-10-CM | POA: Diagnosis not present

## 2023-03-22 DIAGNOSIS — M25551 Pain in right hip: Secondary | ICD-10-CM | POA: Diagnosis not present

## 2023-03-24 DIAGNOSIS — M25552 Pain in left hip: Secondary | ICD-10-CM | POA: Diagnosis not present

## 2023-03-24 DIAGNOSIS — M25551 Pain in right hip: Secondary | ICD-10-CM | POA: Diagnosis not present

## 2023-03-24 DIAGNOSIS — M6281 Muscle weakness (generalized): Secondary | ICD-10-CM | POA: Diagnosis not present

## 2023-03-24 DIAGNOSIS — M4316 Spondylolisthesis, lumbar region: Secondary | ICD-10-CM | POA: Diagnosis not present

## 2023-03-24 DIAGNOSIS — M1610 Unilateral primary osteoarthritis, unspecified hip: Secondary | ICD-10-CM | POA: Diagnosis not present

## 2023-03-29 DIAGNOSIS — M1610 Unilateral primary osteoarthritis, unspecified hip: Secondary | ICD-10-CM | POA: Diagnosis not present

## 2023-03-29 DIAGNOSIS — M25552 Pain in left hip: Secondary | ICD-10-CM | POA: Diagnosis not present

## 2023-03-29 DIAGNOSIS — M25551 Pain in right hip: Secondary | ICD-10-CM | POA: Diagnosis not present

## 2023-03-29 DIAGNOSIS — M6281 Muscle weakness (generalized): Secondary | ICD-10-CM | POA: Diagnosis not present

## 2023-03-29 DIAGNOSIS — M4316 Spondylolisthesis, lumbar region: Secondary | ICD-10-CM | POA: Diagnosis not present

## 2023-03-31 DIAGNOSIS — M6281 Muscle weakness (generalized): Secondary | ICD-10-CM | POA: Diagnosis not present

## 2023-03-31 DIAGNOSIS — M1610 Unilateral primary osteoarthritis, unspecified hip: Secondary | ICD-10-CM | POA: Diagnosis not present

## 2023-03-31 DIAGNOSIS — M25552 Pain in left hip: Secondary | ICD-10-CM | POA: Diagnosis not present

## 2023-03-31 DIAGNOSIS — M25551 Pain in right hip: Secondary | ICD-10-CM | POA: Diagnosis not present

## 2023-03-31 DIAGNOSIS — M4316 Spondylolisthesis, lumbar region: Secondary | ICD-10-CM | POA: Diagnosis not present

## 2023-04-05 DIAGNOSIS — M6281 Muscle weakness (generalized): Secondary | ICD-10-CM | POA: Diagnosis not present

## 2023-04-05 DIAGNOSIS — M25552 Pain in left hip: Secondary | ICD-10-CM | POA: Diagnosis not present

## 2023-04-05 DIAGNOSIS — M25551 Pain in right hip: Secondary | ICD-10-CM | POA: Diagnosis not present

## 2023-04-05 DIAGNOSIS — M4316 Spondylolisthesis, lumbar region: Secondary | ICD-10-CM | POA: Diagnosis not present

## 2023-04-05 DIAGNOSIS — M1610 Unilateral primary osteoarthritis, unspecified hip: Secondary | ICD-10-CM | POA: Diagnosis not present

## 2023-04-12 DIAGNOSIS — M4316 Spondylolisthesis, lumbar region: Secondary | ICD-10-CM | POA: Diagnosis not present

## 2023-04-12 DIAGNOSIS — M6281 Muscle weakness (generalized): Secondary | ICD-10-CM | POA: Diagnosis not present

## 2023-04-12 DIAGNOSIS — M25552 Pain in left hip: Secondary | ICD-10-CM | POA: Diagnosis not present

## 2023-04-12 DIAGNOSIS — M25551 Pain in right hip: Secondary | ICD-10-CM | POA: Diagnosis not present

## 2023-04-12 DIAGNOSIS — M1610 Unilateral primary osteoarthritis, unspecified hip: Secondary | ICD-10-CM | POA: Diagnosis not present

## 2023-04-15 DIAGNOSIS — M1610 Unilateral primary osteoarthritis, unspecified hip: Secondary | ICD-10-CM | POA: Diagnosis not present

## 2023-04-15 DIAGNOSIS — M25552 Pain in left hip: Secondary | ICD-10-CM | POA: Diagnosis not present

## 2023-04-15 DIAGNOSIS — M4316 Spondylolisthesis, lumbar region: Secondary | ICD-10-CM | POA: Diagnosis not present

## 2023-04-15 DIAGNOSIS — M25551 Pain in right hip: Secondary | ICD-10-CM | POA: Diagnosis not present

## 2023-04-15 DIAGNOSIS — M6281 Muscle weakness (generalized): Secondary | ICD-10-CM | POA: Diagnosis not present

## 2023-04-22 DIAGNOSIS — M25552 Pain in left hip: Secondary | ICD-10-CM | POA: Diagnosis not present

## 2023-04-22 DIAGNOSIS — M1610 Unilateral primary osteoarthritis, unspecified hip: Secondary | ICD-10-CM | POA: Diagnosis not present

## 2023-04-22 DIAGNOSIS — M6281 Muscle weakness (generalized): Secondary | ICD-10-CM | POA: Diagnosis not present

## 2023-04-22 DIAGNOSIS — M25551 Pain in right hip: Secondary | ICD-10-CM | POA: Diagnosis not present

## 2023-04-22 DIAGNOSIS — M4316 Spondylolisthesis, lumbar region: Secondary | ICD-10-CM | POA: Diagnosis not present

## 2023-04-25 DIAGNOSIS — I10 Essential (primary) hypertension: Secondary | ICD-10-CM | POA: Diagnosis not present

## 2023-04-25 DIAGNOSIS — E785 Hyperlipidemia, unspecified: Secondary | ICD-10-CM | POA: Diagnosis not present

## 2023-04-25 DIAGNOSIS — R7309 Other abnormal glucose: Secondary | ICD-10-CM | POA: Diagnosis not present

## 2023-04-25 DIAGNOSIS — D649 Anemia, unspecified: Secondary | ICD-10-CM | POA: Diagnosis not present

## 2023-04-25 DIAGNOSIS — M4316 Spondylolisthesis, lumbar region: Secondary | ICD-10-CM | POA: Diagnosis not present

## 2023-04-25 DIAGNOSIS — Z79899 Other long term (current) drug therapy: Secondary | ICD-10-CM | POA: Diagnosis not present

## 2023-04-26 DIAGNOSIS — M6281 Muscle weakness (generalized): Secondary | ICD-10-CM | POA: Diagnosis not present

## 2023-04-26 DIAGNOSIS — M25551 Pain in right hip: Secondary | ICD-10-CM | POA: Diagnosis not present

## 2023-04-26 DIAGNOSIS — M1610 Unilateral primary osteoarthritis, unspecified hip: Secondary | ICD-10-CM | POA: Diagnosis not present

## 2023-04-26 DIAGNOSIS — M25552 Pain in left hip: Secondary | ICD-10-CM | POA: Diagnosis not present

## 2023-04-26 DIAGNOSIS — M4316 Spondylolisthesis, lumbar region: Secondary | ICD-10-CM | POA: Diagnosis not present

## 2023-04-29 DIAGNOSIS — M4316 Spondylolisthesis, lumbar region: Secondary | ICD-10-CM | POA: Diagnosis not present

## 2023-04-29 DIAGNOSIS — M6281 Muscle weakness (generalized): Secondary | ICD-10-CM | POA: Diagnosis not present

## 2023-04-29 DIAGNOSIS — M1610 Unilateral primary osteoarthritis, unspecified hip: Secondary | ICD-10-CM | POA: Diagnosis not present

## 2023-04-29 DIAGNOSIS — M25551 Pain in right hip: Secondary | ICD-10-CM | POA: Diagnosis not present

## 2023-04-29 DIAGNOSIS — M25552 Pain in left hip: Secondary | ICD-10-CM | POA: Diagnosis not present

## 2023-05-02 DIAGNOSIS — Z0000 Encounter for general adult medical examination without abnormal findings: Secondary | ICD-10-CM | POA: Diagnosis not present

## 2023-05-02 DIAGNOSIS — M1610 Unilateral primary osteoarthritis, unspecified hip: Secondary | ICD-10-CM | POA: Diagnosis not present

## 2023-05-02 DIAGNOSIS — M25552 Pain in left hip: Secondary | ICD-10-CM | POA: Diagnosis not present

## 2023-05-02 DIAGNOSIS — M4316 Spondylolisthesis, lumbar region: Secondary | ICD-10-CM | POA: Diagnosis not present

## 2023-05-02 DIAGNOSIS — Z9989 Dependence on other enabling machines and devices: Secondary | ICD-10-CM | POA: Diagnosis not present

## 2023-05-02 DIAGNOSIS — M25551 Pain in right hip: Secondary | ICD-10-CM | POA: Diagnosis not present

## 2023-05-02 DIAGNOSIS — E785 Hyperlipidemia, unspecified: Secondary | ICD-10-CM | POA: Diagnosis not present

## 2023-05-02 DIAGNOSIS — F329 Major depressive disorder, single episode, unspecified: Secondary | ICD-10-CM | POA: Diagnosis not present

## 2023-05-02 DIAGNOSIS — M6281 Muscle weakness (generalized): Secondary | ICD-10-CM | POA: Diagnosis not present

## 2023-05-02 DIAGNOSIS — L600 Ingrowing nail: Secondary | ICD-10-CM | POA: Diagnosis not present

## 2023-05-02 DIAGNOSIS — Z79899 Other long term (current) drug therapy: Secondary | ICD-10-CM | POA: Diagnosis not present

## 2023-05-02 DIAGNOSIS — G4733 Obstructive sleep apnea (adult) (pediatric): Secondary | ICD-10-CM | POA: Diagnosis not present

## 2023-05-02 DIAGNOSIS — R7989 Other specified abnormal findings of blood chemistry: Secondary | ICD-10-CM | POA: Diagnosis not present

## 2023-05-02 DIAGNOSIS — I10 Essential (primary) hypertension: Secondary | ICD-10-CM | POA: Diagnosis not present

## 2023-05-04 DIAGNOSIS — M5416 Radiculopathy, lumbar region: Secondary | ICD-10-CM | POA: Diagnosis not present

## 2023-05-04 DIAGNOSIS — M961 Postlaminectomy syndrome, not elsewhere classified: Secondary | ICD-10-CM | POA: Diagnosis not present

## 2023-05-04 DIAGNOSIS — Z9689 Presence of other specified functional implants: Secondary | ICD-10-CM | POA: Diagnosis not present

## 2023-05-06 DIAGNOSIS — M4316 Spondylolisthesis, lumbar region: Secondary | ICD-10-CM | POA: Diagnosis not present

## 2023-05-06 DIAGNOSIS — M25551 Pain in right hip: Secondary | ICD-10-CM | POA: Diagnosis not present

## 2023-05-06 DIAGNOSIS — M1610 Unilateral primary osteoarthritis, unspecified hip: Secondary | ICD-10-CM | POA: Diagnosis not present

## 2023-05-06 DIAGNOSIS — M25552 Pain in left hip: Secondary | ICD-10-CM | POA: Diagnosis not present

## 2023-05-06 DIAGNOSIS — M6281 Muscle weakness (generalized): Secondary | ICD-10-CM | POA: Diagnosis not present

## 2023-05-10 DIAGNOSIS — M25552 Pain in left hip: Secondary | ICD-10-CM | POA: Diagnosis not present

## 2023-05-10 DIAGNOSIS — M25551 Pain in right hip: Secondary | ICD-10-CM | POA: Diagnosis not present

## 2023-05-10 DIAGNOSIS — M4316 Spondylolisthesis, lumbar region: Secondary | ICD-10-CM | POA: Diagnosis not present

## 2023-05-10 DIAGNOSIS — M1610 Unilateral primary osteoarthritis, unspecified hip: Secondary | ICD-10-CM | POA: Diagnosis not present

## 2023-05-10 DIAGNOSIS — M6281 Muscle weakness (generalized): Secondary | ICD-10-CM | POA: Diagnosis not present

## 2023-05-13 DIAGNOSIS — M4316 Spondylolisthesis, lumbar region: Secondary | ICD-10-CM | POA: Diagnosis not present

## 2023-05-13 DIAGNOSIS — M25552 Pain in left hip: Secondary | ICD-10-CM | POA: Diagnosis not present

## 2023-05-13 DIAGNOSIS — M25551 Pain in right hip: Secondary | ICD-10-CM | POA: Diagnosis not present

## 2023-05-13 DIAGNOSIS — M6281 Muscle weakness (generalized): Secondary | ICD-10-CM | POA: Diagnosis not present

## 2023-05-13 DIAGNOSIS — M1610 Unilateral primary osteoarthritis, unspecified hip: Secondary | ICD-10-CM | POA: Diagnosis not present

## 2023-05-17 DIAGNOSIS — M4316 Spondylolisthesis, lumbar region: Secondary | ICD-10-CM | POA: Diagnosis not present

## 2023-05-17 DIAGNOSIS — M25551 Pain in right hip: Secondary | ICD-10-CM | POA: Diagnosis not present

## 2023-05-17 DIAGNOSIS — M6281 Muscle weakness (generalized): Secondary | ICD-10-CM | POA: Diagnosis not present

## 2023-05-17 DIAGNOSIS — M1610 Unilateral primary osteoarthritis, unspecified hip: Secondary | ICD-10-CM | POA: Diagnosis not present

## 2023-05-17 DIAGNOSIS — M25552 Pain in left hip: Secondary | ICD-10-CM | POA: Diagnosis not present

## 2023-05-19 DIAGNOSIS — M1610 Unilateral primary osteoarthritis, unspecified hip: Secondary | ICD-10-CM | POA: Diagnosis not present

## 2023-05-19 DIAGNOSIS — M25552 Pain in left hip: Secondary | ICD-10-CM | POA: Diagnosis not present

## 2023-05-19 DIAGNOSIS — M25551 Pain in right hip: Secondary | ICD-10-CM | POA: Diagnosis not present

## 2023-05-19 DIAGNOSIS — M4316 Spondylolisthesis, lumbar region: Secondary | ICD-10-CM | POA: Diagnosis not present

## 2023-05-19 DIAGNOSIS — M6281 Muscle weakness (generalized): Secondary | ICD-10-CM | POA: Diagnosis not present

## 2023-05-24 DIAGNOSIS — M4316 Spondylolisthesis, lumbar region: Secondary | ICD-10-CM | POA: Diagnosis not present

## 2023-05-24 DIAGNOSIS — M1610 Unilateral primary osteoarthritis, unspecified hip: Secondary | ICD-10-CM | POA: Diagnosis not present

## 2023-05-24 DIAGNOSIS — M25551 Pain in right hip: Secondary | ICD-10-CM | POA: Diagnosis not present

## 2023-05-24 DIAGNOSIS — M25552 Pain in left hip: Secondary | ICD-10-CM | POA: Diagnosis not present

## 2023-05-24 DIAGNOSIS — M6281 Muscle weakness (generalized): Secondary | ICD-10-CM | POA: Diagnosis not present

## 2023-05-25 DIAGNOSIS — R159 Full incontinence of feces: Secondary | ICD-10-CM | POA: Diagnosis not present

## 2023-05-25 DIAGNOSIS — R152 Fecal urgency: Secondary | ICD-10-CM | POA: Diagnosis not present

## 2023-05-26 DIAGNOSIS — M6281 Muscle weakness (generalized): Secondary | ICD-10-CM | POA: Diagnosis not present

## 2023-05-26 DIAGNOSIS — M25552 Pain in left hip: Secondary | ICD-10-CM | POA: Diagnosis not present

## 2023-05-26 DIAGNOSIS — M25551 Pain in right hip: Secondary | ICD-10-CM | POA: Diagnosis not present

## 2023-05-26 DIAGNOSIS — M1610 Unilateral primary osteoarthritis, unspecified hip: Secondary | ICD-10-CM | POA: Diagnosis not present

## 2023-05-26 DIAGNOSIS — M4316 Spondylolisthesis, lumbar region: Secondary | ICD-10-CM | POA: Diagnosis not present

## 2023-05-31 DIAGNOSIS — M25551 Pain in right hip: Secondary | ICD-10-CM | POA: Diagnosis not present

## 2023-05-31 DIAGNOSIS — M6281 Muscle weakness (generalized): Secondary | ICD-10-CM | POA: Diagnosis not present

## 2023-05-31 DIAGNOSIS — M1610 Unilateral primary osteoarthritis, unspecified hip: Secondary | ICD-10-CM | POA: Diagnosis not present

## 2023-05-31 DIAGNOSIS — M25552 Pain in left hip: Secondary | ICD-10-CM | POA: Diagnosis not present

## 2023-05-31 DIAGNOSIS — M4316 Spondylolisthesis, lumbar region: Secondary | ICD-10-CM | POA: Diagnosis not present

## 2023-06-07 DIAGNOSIS — M25552 Pain in left hip: Secondary | ICD-10-CM | POA: Diagnosis not present

## 2023-06-07 DIAGNOSIS — M25561 Pain in right knee: Secondary | ICD-10-CM | POA: Diagnosis not present

## 2023-06-07 DIAGNOSIS — M25551 Pain in right hip: Secondary | ICD-10-CM | POA: Diagnosis not present

## 2023-06-07 DIAGNOSIS — M1610 Unilateral primary osteoarthritis, unspecified hip: Secondary | ICD-10-CM | POA: Diagnosis not present

## 2023-06-07 DIAGNOSIS — R262 Difficulty in walking, not elsewhere classified: Secondary | ICD-10-CM | POA: Diagnosis not present

## 2023-06-07 DIAGNOSIS — M25562 Pain in left knee: Secondary | ICD-10-CM | POA: Diagnosis not present

## 2023-06-07 DIAGNOSIS — M170 Bilateral primary osteoarthritis of knee: Secondary | ICD-10-CM | POA: Diagnosis not present

## 2023-06-07 DIAGNOSIS — M4316 Spondylolisthesis, lumbar region: Secondary | ICD-10-CM | POA: Diagnosis not present

## 2023-06-07 DIAGNOSIS — M6281 Muscle weakness (generalized): Secondary | ICD-10-CM | POA: Diagnosis not present

## 2023-06-10 DIAGNOSIS — M1610 Unilateral primary osteoarthritis, unspecified hip: Secondary | ICD-10-CM | POA: Diagnosis not present

## 2023-06-10 DIAGNOSIS — M25552 Pain in left hip: Secondary | ICD-10-CM | POA: Diagnosis not present

## 2023-06-10 DIAGNOSIS — M6281 Muscle weakness (generalized): Secondary | ICD-10-CM | POA: Diagnosis not present

## 2023-06-10 DIAGNOSIS — M4316 Spondylolisthesis, lumbar region: Secondary | ICD-10-CM | POA: Diagnosis not present

## 2023-06-10 DIAGNOSIS — M25551 Pain in right hip: Secondary | ICD-10-CM | POA: Diagnosis not present

## 2023-06-17 DIAGNOSIS — R262 Difficulty in walking, not elsewhere classified: Secondary | ICD-10-CM | POA: Diagnosis not present

## 2023-06-17 DIAGNOSIS — M170 Bilateral primary osteoarthritis of knee: Secondary | ICD-10-CM | POA: Diagnosis not present

## 2023-06-17 DIAGNOSIS — M25562 Pain in left knee: Secondary | ICD-10-CM | POA: Diagnosis not present

## 2023-06-17 DIAGNOSIS — M25561 Pain in right knee: Secondary | ICD-10-CM | POA: Diagnosis not present

## 2023-06-21 DIAGNOSIS — M1610 Unilateral primary osteoarthritis, unspecified hip: Secondary | ICD-10-CM | POA: Diagnosis not present

## 2023-06-21 DIAGNOSIS — M4316 Spondylolisthesis, lumbar region: Secondary | ICD-10-CM | POA: Diagnosis not present

## 2023-06-21 DIAGNOSIS — M25552 Pain in left hip: Secondary | ICD-10-CM | POA: Diagnosis not present

## 2023-06-21 DIAGNOSIS — M6281 Muscle weakness (generalized): Secondary | ICD-10-CM | POA: Diagnosis not present

## 2023-06-21 DIAGNOSIS — M25551 Pain in right hip: Secondary | ICD-10-CM | POA: Diagnosis not present

## 2023-06-24 DIAGNOSIS — M25551 Pain in right hip: Secondary | ICD-10-CM | POA: Diagnosis not present

## 2023-06-24 DIAGNOSIS — M25562 Pain in left knee: Secondary | ICD-10-CM | POA: Diagnosis not present

## 2023-06-24 DIAGNOSIS — M25552 Pain in left hip: Secondary | ICD-10-CM | POA: Diagnosis not present

## 2023-06-24 DIAGNOSIS — M1610 Unilateral primary osteoarthritis, unspecified hip: Secondary | ICD-10-CM | POA: Diagnosis not present

## 2023-06-24 DIAGNOSIS — M25561 Pain in right knee: Secondary | ICD-10-CM | POA: Diagnosis not present

## 2023-06-24 DIAGNOSIS — M4316 Spondylolisthesis, lumbar region: Secondary | ICD-10-CM | POA: Diagnosis not present

## 2023-06-24 DIAGNOSIS — M170 Bilateral primary osteoarthritis of knee: Secondary | ICD-10-CM | POA: Diagnosis not present

## 2023-06-24 DIAGNOSIS — R262 Difficulty in walking, not elsewhere classified: Secondary | ICD-10-CM | POA: Diagnosis not present

## 2023-06-24 DIAGNOSIS — M6281 Muscle weakness (generalized): Secondary | ICD-10-CM | POA: Diagnosis not present

## 2023-06-29 ENCOUNTER — Encounter: Payer: Self-pay | Admitting: Podiatry

## 2023-06-29 ENCOUNTER — Ambulatory Visit (INDEPENDENT_AMBULATORY_CARE_PROVIDER_SITE_OTHER): Admitting: Podiatry

## 2023-06-29 DIAGNOSIS — Q828 Other specified congenital malformations of skin: Secondary | ICD-10-CM | POA: Diagnosis not present

## 2023-06-29 DIAGNOSIS — R152 Fecal urgency: Secondary | ICD-10-CM | POA: Diagnosis not present

## 2023-06-29 NOTE — Progress Notes (Signed)
 Subjective:  Patient ID: John [name], male    DOB: [birth year],  MRN: [id number]  Chief Complaint  Patient presents with   Foot Pain    RM 12: growth on bottom of right foot near 5th  left big toe sore for about 3 months.     68 y.o. male presents with the above complaint.  Patient presents with right submetatarsal 4 porokeratotic lesion painful to touch is progressive gotten worse worse with ambulation worse with pressure patient would like to discuss treatment options for has not seen and was prior to seeing me.  Pain scale 7 out of 10 dull aching nature   Review of Systems: Negative except as noted in the HPI. Denies N/V/F/Ch.  Past Medical History:  Diagnosis Date   Depression    Hyperlipemia    Hypertension    Memory loss    Psoriatic arthritis (HCC)     Current Outpatient Medications:    aspirin 81 MG chewable tablet, Chew 1 tablet by mouth daily., Disp: , Rfl:    buPROPion (WELLBUTRIN XL) 300 MG 24 hr tablet, Take 300 mg by mouth daily., Disp: , Rfl:    carvedilol (COREG) 3.125 MG tablet, Take 3.125 mg by mouth 2 (two) times daily with a meal., Disp: , Rfl:    Cinnamon 500 MG capsule, Take 500 mg by mouth daily., Disp: , Rfl:    citalopram (CELEXA) 20 MG tablet, Take 20 mg by mouth daily., Disp: , Rfl:    fish oil-omega-3 fatty acids 1000 MG capsule, Take 2 g by mouth in the morning and at bedtime. , Disp: , Rfl:    gabapentin (NEURONTIN) 300 MG capsule, Take 300 mg by mouth at bedtime., Disp: , Rfl:    Ginkgo 60 MG TABS, Take 1 tablet by mouth daily., Disp: , Rfl:    GINSENG EXTRACT PO, Take 400 mg by mouth daily., Disp: , Rfl:    Glucosamine 500 MG CAPS, Take 3 capsules by mouth 2 (two) times daily., Disp: , Rfl:    losartan (COZAAR) 25 MG tablet, Take 25 mg by mouth daily., Disp: , Rfl:    modafinil (PROVIGIL) 200 MG tablet, Take 200 mg by mouth daily., Disp: , Rfl:    Multiple Vitamin (MULTIVITAMIN) capsule, Take 1 capsule by mouth daily., Disp: , Rfl:     simvastatin (ZOCOR) 10 MG tablet, Take 10 mg by mouth at bedtime., Disp: , Rfl:    tamsulosin (FLOMAX) 0.4 MG CAPS capsule, Take 0.4 mg by mouth daily., Disp: , Rfl:   Social History   Tobacco Use  Smoking Status Former   Current packs/day: 0.00   Types: Cigarettes   Quit date: 07/2000   Years since quitting: 22.9  Smokeless Tobacco Never    Allergies  Allergen Reactions   Clindamycin    Penicillin G     Other Reaction(s): Unknown   Clindamycin Hcl Rash   Clindamycin/Lincomycin Rash   Meropenem Rash   Penicillins Rash    ++ pt's wife reported he tolerated keflex in the past++ Has patient had a PCN reaction causing immediate rash, facial/tongue/throat swelling, SOB or lightheadedness with hypotension: No Has patient had a PCN reaction causing severe rash involving mucus membranes or skin necrosis: No Has patient had a PCN reaction that required hospitalization No Has patient had a PCN reaction occurring within the last 10 years: No If all of the above answers are "NO", then may proceed with Cephalosporin use.   Objective:  There were no vitals filed  for this visit. There is no height or weight on file to calculate BMI. Constitutional Well developed. Well nourished.  Vascular Dorsalis pedis pulses palpable bilaterally. Posterior tibial pulses palpable bilaterally. Capillary refill normal to all digits.  No cyanosis or clubbing noted. Pedal hair growth normal.  Neurologic Normal speech. Oriented to person, place, and time. Epicritic sensation to light touch grossly present bilaterally.  Dermatologic Right submetatarsal 5 porokeratotic lesion with central nucleated core noted.  Pain on palpation to the lesion.  Orthopedic: Normal joint ROM without pain or crepitus bilaterally. No visible deformities. No bony tenderness.   Radiographs: None Assessment:   1. Porokeratosis    Plan:  Patient was evaluated and treated and all questions answered.  Right submet 5  porokeratosis -All questions and concerns were discussed with the patient extensive due to given the amount of pain that is extremely benefit from debridement of the lesion using chisel blade handle the lesion was debride down to healthy scar tissue no complication noted no pinpoint bleeding noted -Sugar modification discussed  No follow-ups on file.

## 2023-07-01 DIAGNOSIS — M25562 Pain in left knee: Secondary | ICD-10-CM | POA: Diagnosis not present

## 2023-07-01 DIAGNOSIS — M25561 Pain in right knee: Secondary | ICD-10-CM | POA: Diagnosis not present

## 2023-07-01 DIAGNOSIS — M170 Bilateral primary osteoarthritis of knee: Secondary | ICD-10-CM | POA: Diagnosis not present

## 2023-07-01 DIAGNOSIS — R262 Difficulty in walking, not elsewhere classified: Secondary | ICD-10-CM | POA: Diagnosis not present

## 2023-07-08 DIAGNOSIS — R262 Difficulty in walking, not elsewhere classified: Secondary | ICD-10-CM | POA: Diagnosis not present

## 2023-07-08 DIAGNOSIS — M25562 Pain in left knee: Secondary | ICD-10-CM | POA: Diagnosis not present

## 2023-07-08 DIAGNOSIS — M25561 Pain in right knee: Secondary | ICD-10-CM | POA: Diagnosis not present

## 2023-07-08 DIAGNOSIS — M170 Bilateral primary osteoarthritis of knee: Secondary | ICD-10-CM | POA: Diagnosis not present

## 2023-08-16 DIAGNOSIS — K635 Polyp of colon: Secondary | ICD-10-CM | POA: Diagnosis not present

## 2023-08-16 DIAGNOSIS — K6389 Other specified diseases of intestine: Secondary | ICD-10-CM | POA: Diagnosis not present

## 2023-08-16 DIAGNOSIS — K5730 Diverticulosis of large intestine without perforation or abscess without bleeding: Secondary | ICD-10-CM | POA: Diagnosis not present

## 2023-08-16 DIAGNOSIS — R152 Fecal urgency: Secondary | ICD-10-CM | POA: Diagnosis not present

## 2023-08-16 DIAGNOSIS — D122 Benign neoplasm of ascending colon: Secondary | ICD-10-CM | POA: Diagnosis not present

## 2023-08-16 DIAGNOSIS — R195 Other fecal abnormalities: Secondary | ICD-10-CM | POA: Diagnosis not present

## 2023-09-23 DIAGNOSIS — M25552 Pain in left hip: Secondary | ICD-10-CM | POA: Diagnosis not present

## 2023-09-23 DIAGNOSIS — M160 Bilateral primary osteoarthritis of hip: Secondary | ICD-10-CM | POA: Diagnosis not present

## 2023-09-23 DIAGNOSIS — M25551 Pain in right hip: Secondary | ICD-10-CM | POA: Diagnosis not present

## 2023-09-28 DIAGNOSIS — M25551 Pain in right hip: Secondary | ICD-10-CM | POA: Diagnosis not present

## 2023-09-28 DIAGNOSIS — M25552 Pain in left hip: Secondary | ICD-10-CM | POA: Diagnosis not present

## 2023-10-23 ENCOUNTER — Emergency Department (HOSPITAL_COMMUNITY)
Admission: EM | Admit: 2023-10-23 | Discharge: 2023-10-23 | Disposition: A | Discharge status: Home / Self Care | Attending: Emergency Medicine | Admitting: Emergency Medicine

## 2023-10-23 ENCOUNTER — Encounter (HOSPITAL_COMMUNITY): Payer: Self-pay

## 2023-10-23 ENCOUNTER — Emergency Department (HOSPITAL_COMMUNITY)

## 2023-10-23 ENCOUNTER — Other Ambulatory Visit: Payer: Self-pay

## 2023-10-23 DIAGNOSIS — S0181XA Laceration without foreign body of other part of head, initial encounter: Secondary | ICD-10-CM | POA: Insufficient documentation

## 2023-10-23 DIAGNOSIS — S51012A Laceration without foreign body of left elbow, initial encounter: Secondary | ICD-10-CM | POA: Insufficient documentation

## 2023-10-23 DIAGNOSIS — I10 Essential (primary) hypertension: Secondary | ICD-10-CM | POA: Diagnosis not present

## 2023-10-23 DIAGNOSIS — Z91038 Other insect allergy status: Secondary | ICD-10-CM | POA: Diagnosis not present

## 2023-10-23 DIAGNOSIS — Z79899 Other long term (current) drug therapy: Secondary | ICD-10-CM | POA: Insufficient documentation

## 2023-10-23 DIAGNOSIS — S0993XA Unspecified injury of face, initial encounter: Secondary | ICD-10-CM | POA: Diagnosis present

## 2023-10-23 DIAGNOSIS — W01198A Fall on same level from slipping, tripping and stumbling with subsequent striking against other object, initial encounter: Secondary | ICD-10-CM | POA: Insufficient documentation

## 2023-10-23 DIAGNOSIS — S0990XA Unspecified injury of head, initial encounter: Secondary | ICD-10-CM | POA: Diagnosis not present

## 2023-10-23 DIAGNOSIS — W19XXXA Unspecified fall, initial encounter: Secondary | ICD-10-CM | POA: Diagnosis not present

## 2023-10-23 DIAGNOSIS — I6782 Cerebral ischemia: Secondary | ICD-10-CM | POA: Diagnosis not present

## 2023-10-23 DIAGNOSIS — S0101XA Laceration without foreign body of scalp, initial encounter: Secondary | ICD-10-CM | POA: Diagnosis not present

## 2023-10-23 DIAGNOSIS — T7840XA Allergy, unspecified, initial encounter: Secondary | ICD-10-CM | POA: Diagnosis not present

## 2023-10-23 DIAGNOSIS — T782XXA Anaphylactic shock, unspecified, initial encounter: Secondary | ICD-10-CM | POA: Diagnosis not present

## 2023-10-23 DIAGNOSIS — Z7982 Long term (current) use of aspirin: Secondary | ICD-10-CM | POA: Insufficient documentation

## 2023-10-23 DIAGNOSIS — Z23 Encounter for immunization: Secondary | ICD-10-CM | POA: Diagnosis not present

## 2023-10-23 DIAGNOSIS — T63481A Toxic effect of venom of other arthropod, accidental (unintentional), initial encounter: Secondary | ICD-10-CM

## 2023-10-23 DIAGNOSIS — R0902 Hypoxemia: Secondary | ICD-10-CM | POA: Diagnosis not present

## 2023-10-23 DIAGNOSIS — L509 Urticaria, unspecified: Secondary | ICD-10-CM | POA: Diagnosis not present

## 2023-10-23 DIAGNOSIS — T63411A Toxic effect of venom of centipedes and venomous millipedes, accidental (unintentional), initial encounter: Secondary | ICD-10-CM | POA: Diagnosis not present

## 2023-10-23 MED ORDER — DIPHENHYDRAMINE HCL 50 MG/ML IJ SOLN
50.0000 mg | Freq: Once | INTRAMUSCULAR | Status: AC
  Administered 2023-10-23: 50 mg via INTRAVENOUS
  Filled 2023-10-23: qty 1

## 2023-10-23 MED ORDER — METHYLPREDNISOLONE SODIUM SUCC 125 MG IJ SOLR
125.0000 mg | Freq: Once | INTRAMUSCULAR | Status: AC
  Administered 2023-10-23: 125 mg via INTRAVENOUS
  Filled 2023-10-23: qty 2

## 2023-10-23 MED ORDER — FAMOTIDINE IN NACL 20-0.9 MG/50ML-% IV SOLN
20.0000 mg | Freq: Once | INTRAVENOUS | Status: AC
  Administered 2023-10-23: 20 mg via INTRAVENOUS
  Filled 2023-10-23: qty 50

## 2023-10-23 MED ORDER — EPINEPHRINE 0.3 MG/0.3ML IJ SOAJ: 0.3000 mg | INTRAMUSCULAR | 0 refills | Status: AC | PRN

## 2023-10-23 MED ORDER — TETANUS-DIPHTH-ACELL PERTUSSIS 5-2.5-18.5 LF-MCG/0.5 IM SUSY
0.5000 mL | PREFILLED_SYRINGE | Freq: Once | INTRAMUSCULAR | Status: AC
  Administered 2023-10-23: 0.5 mL via INTRAMUSCULAR
  Filled 2023-10-23: qty 0.5

## 2023-10-23 NOTE — ED Provider Notes (Signed)
 [place] [facility] AT [facility] Provider Note   CSN: [id number] Arrival date & time: 10/23/23  1849     History {Add pertinent medical, surgical, social history, OB history to HPI:1} Chief Complaint  Patient presents with   Allergic Reaction    John [name] is a 68 y.o. male with PMH as listed below who presents BIB EMS from home for multiple bee stings. Pt ran downhill and fell. Pt presents with a laceration to the forehead and skin tear to left elbow. Pt is allergic to bees but did not have an epi-pen. Pt also c/o SOB upon EMS arrival. Pt also has generalized hives.   110 systolic   0.3 epi and 0.5 epi given  Past Medical History:  Diagnosis Date   Depression    Hyperlipemia    Hypertension    Memory loss    Psoriatic arthritis (HCC)        Home Medications Prior to Admission medications   Medication Sig Start Date End Date Taking? Authorizing Provider  aspirin 81 MG chewable tablet Chew 1 tablet by mouth daily.    [provider]  buPROPion  (WELLBUTRIN  XL) 300 MG 24 hr tablet Take 300 mg by mouth daily.    [provider]  carvedilol  (COREG ) 3.125 MG tablet Take 3.125 mg by mouth 2 (two) times daily with a meal.    [provider]  Cinnamon 500 MG capsule Take 500 mg by mouth daily.    [provider]  citalopram (CELEXA) 20 MG tablet Take 20 mg by mouth daily.    [provider]  fish oil-omega-3 fatty acids 1000 MG capsule Take 2 g by mouth in the morning and at bedtime.     [provider]  gabapentin (NEURONTIN) 300 MG capsule Take 300 mg by mouth at bedtime. 02/22/21   [provider]  Ginkgo 60 MG TABS Take 1 tablet by mouth daily.    [provider]  GINSENG EXTRACT PO Take 400 mg by mouth daily.    [provider]  Glucosamine 500 MG CAPS Take 3 capsules by mouth 2 (two) times daily.    [provider]  losartan  (COZAAR ) 25 MG tablet Take 25  mg by mouth daily.    [provider]  modafinil (PROVIGIL) 200 MG tablet Take 200 mg by mouth daily.    [provider]  Multiple Vitamin (MULTIVITAMIN) capsule Take 1 capsule by mouth daily.    [provider]  simvastatin (ZOCOR) 10 MG tablet Take 10 mg by mouth at bedtime. 05/08/21   [provider]  tamsulosin (FLOMAX) 0.4 MG CAPS capsule Take 0.4 mg by mouth daily. 05/13/21   [provider]      Allergies    Clindamycin , Penicillin g, Clindamycin  hcl, Clindamycin /lincomycin, Meropenem , and Penicillins    Review of Systems   Review of Systems A 10 point review of systems was performed and is negative unless otherwise reported in HPI.  Physical Exam Updated Vital Signs BP (!) 129/56 (BP Location: Left Arm)   Pulse (!) 58   Temp 97.6 F (36.4 C) (Oral)   Resp 14   SpO2 100%  Physical Exam General: Normal appearing {Desc; male/male:11659}, lying in bed.  HEENT: PERRLA, Sclera anicteric, MMM, trachea midline.  Cardiology: RRR, no murmurs/rubs/gallops. BL radial and DP pulses equal bilaterally.  Resp: Normal respiratory rate and effort. CTAB, no wheezes, rhonchi, crackles.  Abd: Soft, non-tender, non-distended. No rebound tenderness or guarding.  GU: Deferred. MSK: No peripheral edema or signs of trauma. Extremities without deformity or TTP. No cyanosis or clubbing. Skin: warm, dry. No rashes or lesions. Back: No CVA tenderness Neuro: A&Ox4, CNs II-XII grossly intact. MAEs. Sensation grossly intact.  Psych: Normal mood and affect.   ED Results / Procedures / Treatments   Labs (all labs ordered are listed, but only abnormal results are displayed) Labs Reviewed - No data to display  EKG None  Radiology No results found.  Procedures Procedures  {Document cardiac monitor, telemetry assessment procedure when appropriate:1}  Medications Ordered in ED Medications - No data to display  ED Course/ Medical Decision Making/  A&P                          Medical Decision Making Amount and/or Complexity of Data Reviewed Radiology: ordered. Decision-making details documented in ED Course.  Risk Prescription drug management.    This patient presents to the ED for concern of ***, this involves an extensive number of treatment options, and is a complaint that carries with it a high risk of complications and morbidity.  I considered the following differential and admission for this acute, potentially life threatening condition.   MDM:    ***  Clinical Course as of 10/27/23 2312  Sun Oct 23, 2023  2156 CT Head Wo Contrast 1. No acute intracranial abnormality. 2. Left frontal scalp hematoma/laceration. No fracture.   [HN]    Clinical Course User Index [HN] John Sid SAILOR, MD    Labs: I Ordered, and personally interpreted labs.  The pertinent results include:  ***  Imaging Studies ordered: I ordered imaging studies including *** I independently visualized and interpreted imaging. I agree with the radiologist interpretation  Additional history obtained from ***.  External records from outside source obtained and reviewed including ***  Cardiac Monitoring: The patient was maintained on a cardiac monitor.  I personally viewed and interpreted the cardiac monitored which showed an underlying rhythm of: ***  Reevaluation: After the interventions noted above, I reevaluated the patient and found that they have :{resolved/improved/worsened:23923::improved}  Social Determinants of Health: ***  Disposition:  ***  Co morbidities that complicate the patient evaluation  Past Medical History:  Diagnosis Date   Depression    Hyperlipemia    Hypertension    Memory loss    Psoriatic arthritis (HCC)      Medicines No orders of the defined types were placed in this encounter.   I have reviewed the patients home medicines and have made adjustments as needed  Problem List / ED Course: Problem List  Items Addressed This Visit   None        {Document critical care time when appropriate:1} {Document review of labs and clinical decision tools ie heart score, Chads2Vasc2 etc:1}  {Document your independent review of radiology images, and any outside records:1} {Document your discussion with family members, caretakers, and with consultants:1} {Document social determinants of health affecting pt's care:1} {Document your decision making why or why not admission, treatments were needed:1}  This note was created using dictation software, which may contain spelling or grammatical errors.

## 2023-10-23 NOTE — ED Triage Notes (Addendum)
 Pt BIB EMS from home for multiple bee stings. Pt ran downhill and fell. Pt presents with a laceration to the forehead and skin tear to left elbow. Pt is allergic to bees but did not have an epi-pen. Pt also c/o SOB upon EMS arrival. Pt also has generalized hives.  110 systolic  0.3 epi and 0.5 epi given

## 2023-10-23 NOTE — Discharge Instructions (Addendum)
 Thank you for coming to [facility] Emergency Department. You were seen for allergic reaction to bee stings.  If you experience a diffuse red rash (hives) AND any of the following: nausea/vomiting/diarrhea, lightheadedness/passing out, shortness of breath/wheezing, or facial/throat swelling, please give yourself the epi pen.   You can take benadryl  at home every 6-8 hours for itching or rash.  You can use antibiotic ointment twice per day on the laceration on your forehead. The Steri-strips should fall off on their on in about 7 days.  -If you use the epi-pen, please come to the ED. -Please follow up with your primary care physician or the allergist for follow up within 1-2 weeks.    Do not hesitate to return to the ED or call 911 if you experience: -Worsening symptoms -Lightheadedness, passing out -Fevers/chills -Anything else that concerns you

## 2023-10-23 NOTE — ED Notes (Signed)
 Multiple IV attempts during EMS transport without success. This Clinical research associate attempted x2 without success. IV team consult placed

## 2023-10-24 DIAGNOSIS — R7989 Other specified abnormal findings of blood chemistry: Secondary | ICD-10-CM | POA: Diagnosis not present

## 2023-10-24 DIAGNOSIS — I10 Essential (primary) hypertension: Secondary | ICD-10-CM | POA: Diagnosis not present

## 2023-10-24 DIAGNOSIS — Z125 Encounter for screening for malignant neoplasm of prostate: Secondary | ICD-10-CM | POA: Diagnosis not present

## 2023-10-24 DIAGNOSIS — E785 Hyperlipidemia, unspecified: Secondary | ICD-10-CM | POA: Diagnosis not present

## 2023-10-28 DIAGNOSIS — M1612 Unilateral primary osteoarthritis, left hip: Secondary | ICD-10-CM | POA: Diagnosis not present

## 2023-10-28 DIAGNOSIS — M25551 Pain in right hip: Secondary | ICD-10-CM | POA: Diagnosis not present

## 2023-10-31 DIAGNOSIS — M4316 Spondylolisthesis, lumbar region: Secondary | ICD-10-CM | POA: Diagnosis not present

## 2023-10-31 DIAGNOSIS — Z862 Personal history of diseases of the blood and blood-forming organs and certain disorders involving the immune mechanism: Secondary | ICD-10-CM | POA: Diagnosis not present

## 2023-10-31 DIAGNOSIS — M1610 Unilateral primary osteoarthritis, unspecified hip: Secondary | ICD-10-CM | POA: Diagnosis not present

## 2023-10-31 DIAGNOSIS — Z6835 Body mass index (BMI) 35.0-35.9, adult: Secondary | ICD-10-CM | POA: Diagnosis not present

## 2023-10-31 DIAGNOSIS — I10 Essential (primary) hypertension: Secondary | ICD-10-CM | POA: Diagnosis not present

## 2023-10-31 DIAGNOSIS — L4050 Arthropathic psoriasis, unspecified: Secondary | ICD-10-CM | POA: Diagnosis not present

## 2023-10-31 DIAGNOSIS — E785 Hyperlipidemia, unspecified: Secondary | ICD-10-CM | POA: Diagnosis not present

## 2023-10-31 DIAGNOSIS — Z9989 Dependence on other enabling machines and devices: Secondary | ICD-10-CM | POA: Diagnosis not present

## 2023-10-31 DIAGNOSIS — E559 Vitamin D deficiency, unspecified: Secondary | ICD-10-CM | POA: Diagnosis not present

## 2023-10-31 DIAGNOSIS — R42 Dizziness and giddiness: Secondary | ICD-10-CM | POA: Diagnosis not present

## 2023-12-02 DIAGNOSIS — R310 Gross hematuria: Secondary | ICD-10-CM | POA: Diagnosis not present

## 2023-12-16 DIAGNOSIS — R310 Gross hematuria: Secondary | ICD-10-CM | POA: Diagnosis not present

## 2023-12-22 DIAGNOSIS — M170 Bilateral primary osteoarthritis of knee: Secondary | ICD-10-CM | POA: Diagnosis not present

## 2023-12-22 DIAGNOSIS — G8929 Other chronic pain: Secondary | ICD-10-CM | POA: Diagnosis not present

## 2024-01-08 DIAGNOSIS — Z23 Encounter for immunization: Secondary | ICD-10-CM | POA: Diagnosis not present

## 2024-01-11 DIAGNOSIS — M25561 Pain in right knee: Secondary | ICD-10-CM | POA: Diagnosis not present

## 2024-01-11 DIAGNOSIS — M170 Bilateral primary osteoarthritis of knee: Secondary | ICD-10-CM | POA: Diagnosis not present

## 2024-01-11 DIAGNOSIS — M25562 Pain in left knee: Secondary | ICD-10-CM | POA: Diagnosis not present

## 2024-01-11 DIAGNOSIS — R262 Difficulty in walking, not elsewhere classified: Secondary | ICD-10-CM | POA: Diagnosis not present

## 2024-01-16 DIAGNOSIS — R310 Gross hematuria: Secondary | ICD-10-CM | POA: Diagnosis not present

## 2024-01-18 DIAGNOSIS — R262 Difficulty in walking, not elsewhere classified: Secondary | ICD-10-CM | POA: Diagnosis not present

## 2024-01-18 DIAGNOSIS — M25562 Pain in left knee: Secondary | ICD-10-CM | POA: Diagnosis not present

## 2024-01-18 DIAGNOSIS — M25561 Pain in right knee: Secondary | ICD-10-CM | POA: Diagnosis not present

## 2024-01-18 DIAGNOSIS — M170 Bilateral primary osteoarthritis of knee: Secondary | ICD-10-CM | POA: Diagnosis not present

## 2024-01-19 DIAGNOSIS — G8929 Other chronic pain: Secondary | ICD-10-CM | POA: Diagnosis not present

## 2024-01-19 DIAGNOSIS — M170 Bilateral primary osteoarthritis of knee: Secondary | ICD-10-CM | POA: Diagnosis not present

## 2024-01-25 DIAGNOSIS — M25562 Pain in left knee: Secondary | ICD-10-CM | POA: Diagnosis not present

## 2024-01-25 DIAGNOSIS — M25561 Pain in right knee: Secondary | ICD-10-CM | POA: Diagnosis not present

## 2024-01-25 DIAGNOSIS — R262 Difficulty in walking, not elsewhere classified: Secondary | ICD-10-CM | POA: Diagnosis not present

## 2024-01-25 DIAGNOSIS — M170 Bilateral primary osteoarthritis of knee: Secondary | ICD-10-CM | POA: Diagnosis not present

## 2024-02-01 DIAGNOSIS — M25561 Pain in right knee: Secondary | ICD-10-CM | POA: Diagnosis not present

## 2024-02-01 DIAGNOSIS — M170 Bilateral primary osteoarthritis of knee: Secondary | ICD-10-CM | POA: Diagnosis not present

## 2024-02-01 DIAGNOSIS — M25562 Pain in left knee: Secondary | ICD-10-CM | POA: Diagnosis not present

## 2024-02-01 DIAGNOSIS — R262 Difficulty in walking, not elsewhere classified: Secondary | ICD-10-CM | POA: Diagnosis not present

## 2024-02-06 DIAGNOSIS — H6120 Impacted cerumen, unspecified ear: Secondary | ICD-10-CM | POA: Diagnosis not present

## 2024-02-06 DIAGNOSIS — H6993 Unspecified Eustachian tube disorder, bilateral: Secondary | ICD-10-CM | POA: Diagnosis not present

## 2024-02-08 DIAGNOSIS — R262 Difficulty in walking, not elsewhere classified: Secondary | ICD-10-CM | POA: Diagnosis not present

## 2024-02-08 DIAGNOSIS — M25562 Pain in left knee: Secondary | ICD-10-CM | POA: Diagnosis not present

## 2024-02-08 DIAGNOSIS — M25561 Pain in right knee: Secondary | ICD-10-CM | POA: Diagnosis not present

## 2024-02-08 DIAGNOSIS — M170 Bilateral primary osteoarthritis of knee: Secondary | ICD-10-CM | POA: Diagnosis not present

## 2024-02-14 NOTE — H&P (Signed)
 TOTAL HIP ADMISSION H&P  Patient is admitted for left total hip arthroplasty.  Subjective:  Chief Complaint: Left hip pain  HPI: John [name], 68 y.o. male, has a history of pain and functional disability in the left hip due to arthritis and patient has failed non-surgical conservative treatments for greater than 12 weeks to include NSAID's and/or analgesics and activity modification. Onset of symptoms was gradual, starting several years ago with gradually worsening course since that time. The patient noted no past surgery on the left hip. Patient currently rates pain in the left hip at 8 out of 10 with activity. Patient has night pain, worsening of pain with activity and weight bearing, pain with passive range of motion, and crepitus. Patient has evidence of bone-on-bone arthritis throughout the left hip with large marginal osteophytes. The right hip shows some bone-on-bone changes centrally but retains some joint space superiorly by imaging studies. This condition presents safety issues increasing the risk of falls. There is no current active infection.  Patient Active Problem List   Diagnosis Date Noted   Treatment-emergent central sleep apnea 10/18/2022   Severe obstructive sleep apnea-hypopnea syndrome 07/01/2021   Dependence on CPAP ventilation 07/01/2021   At risk for central sleep apnea 07/01/2021   Cerebral vascular disease 10/09/2019   Memory loss 07/05/2019   Empyema (HCC) 05/13/2016   Psoriatic arthritis (HCC)    Dyspnea 05/11/2016   Pleural effusion 05/11/2016    Past Medical History:  Diagnosis Date   Depression    Hyperlipemia    Hypertension    Memory loss    Psoriatic arthritis (HCC)     Past Surgical History:  Procedure Laterality Date   TONSILLECTOMY     VIDEO ASSISTED THORACOSCOPY (VATS)/EMPYEMA Right 05/13/2016   Procedure: VIDEO ASSISTED THORACOSCOPY (VATS)/DRAIN EMPYEMA;  Surgeon: Dorise MARLA Fellers, MD;  Location: [facility] OR;  Service: Thoracic;  Laterality:  Right;    Prior to Admission medications   Medication Sig Start Date End Date Taking? Authorizing Provider  aspirin 81 MG chewable tablet Chew 1 tablet by mouth daily.    [provider]  buPROPion  (WELLBUTRIN  XL) 300 MG 24 hr tablet Take 300 mg by mouth daily.    [provider]  carvedilol  (COREG ) 3.125 MG tablet Take 3.125 mg by mouth 2 (two) times daily with a meal.    [provider]  Cinnamon 500 MG capsule Take 500 mg by mouth daily.    [provider]  citalopram (CELEXA) 20 MG tablet Take 20 mg by mouth daily.    [provider]  EPINEPHrine  0.3 mg/0.3 mL IJ SOAJ injection Inject 0.3 mg into the muscle as needed for anaphylaxis. 10/23/23   Franklyn Sid SAILOR, MD  fish oil-omega-3 fatty acids 1000 MG capsule Take 2 g by mouth in the morning and at bedtime.     [provider]  gabapentin (NEURONTIN) 300 MG capsule Take 300 mg by mouth at bedtime. 02/22/21   [provider]  Ginkgo 60 MG TABS Take 1 tablet by mouth daily.    [provider]  GINSENG EXTRACT PO Take 400 mg by mouth daily.    [provider]  Glucosamine 500 MG CAPS Take 3 capsules by mouth 2 (two) times daily.    [provider]  losartan  (COZAAR ) 25 MG tablet Take 25 mg by mouth daily.    [provider]  modafinil (PROVIGIL) 200 MG tablet Take 200 mg by mouth daily.    [provider]  Multiple Vitamin (MULTIVITAMIN) capsule Take 1 capsule by mouth daily.    [provider]  Na Sulfate-K Sulfate-Mg Sulfate concentrate (SUPREP) 17.5-3.13-1.6 GM/177ML SOLN Take 1 kit by mouth See admin instructions.    [provider]  simvastatin (ZOCOR) 10 MG tablet Take 10 mg by mouth at bedtime. 05/08/21   [provider]  tamsulosin (FLOMAX) 0.4 MG CAPS capsule Take 0.4 mg by mouth daily. 05/13/21   [provider]  traMADol (ULTRAM) 50 MG tablet Take 50 mg by mouth 2 (two) times daily as needed.  06/30/23   [provider]  triamcinolone  cream (KENALOG) 0.1 % Apply 1 Application topically 2 (two) times daily.    [provider]    Allergies  Allergen Reactions   Clindamycin     Penicillin G     Other Reaction(s): Unknown   Clindamycin  Hcl Rash   Clindamycin /Lincomycin Rash   Meropenem  Rash   Penicillins Rash    ++ pt's wife reported he tolerated keflex in the past++ Has patient had a PCN reaction causing immediate rash, facial/tongue/throat swelling, SOB or lightheadedness with hypotension: No Has patient had a PCN reaction causing severe rash involving mucus membranes or skin necrosis: No Has patient had a PCN reaction that required hospitalization No Has patient had a PCN reaction occurring within the last 10 years: No If all of the above answers are NO, then may proceed with Cephalosporin use.    Social History   Socioeconomic History   Marital status: Married    Spouse name: Macario   Number of children: 1   Years of education: 12   Highest education level: High school graduate  Occupational History   Occupation: Retired  Tobacco Use   Smoking status: Former    Current packs/day: 0.00    Types: Cigarettes    Quit date: 07/2000    Years since quitting: 23.6   Smokeless tobacco: Never  Vaping Use   Vaping status: Never Used  Substance and Sexual Activity   Alcohol use: Yes    Comment: one drink every 4 weeks   Drug use: Never   Sexual activity: Yes    Birth control/protection: None  Other Topics Concern   Not on file  Social History Narrative   Lives with wife.   Right-handed.   No daily caffeine use   Social Drivers of Corporate Investment Banker Strain: Not on file  Food Insecurity: Not on file  Transportation Needs: Not on file  Physical Activity: Not on file  Stress: Not on file  Social Connections: Not on file  Intimate Partner Violence: Not on file    Tobacco Use: Medium Risk (10/23/2023)   Patient History    Smoking  Tobacco Use: Former    Smokeless Tobacco Use: Never    Passive Exposure: Not on file   Social History   Substance and Sexual Activity  Alcohol Use Yes   Comment: one drink every 4 weeks    Family History  Problem Relation Age of Onset   COPD Mother        died at 50   Alzheimer's disease Mother    Diabetes Father        died at 56   Alzheimer's disease Father    Early death Paternal Aunt     Review of Systems  Constitutional:  Negative for chills and fever.  HENT:  Negative for congestion, sore throat and tinnitus.   Eyes:  Negative for double vision, photophobia and pain.  Respiratory:  Negative for cough, shortness of breath and wheezing.   Cardiovascular:  Negative for chest pain, palpitations and orthopnea.  Gastrointestinal:  Negative for heartburn, nausea and vomiting.  Genitourinary:  Negative for dysuria, frequency and urgency.  Musculoskeletal:  Positive for joint pain.  Neurological:  Negative for dizziness, weakness and headaches.     Objective:  Physical Exam: General: Well-developed male, alert, oriented, no apparent distress.  Left Hip: Flexion at 90 degrees, no internal rotation, approximately 10 degrees of external rotation, 10 degrees of abduction.  Right Hip: Flexion at 110 degrees, internal rotation at 20 degrees, external rotation at 30 degrees, abduction at 30 degrees.  Gait: Antalgic pattern on the left.  Imaging Review Plain radiographs demonstrate severe degenerative joint disease of the left hip. The bone quality appears to be adequate for age and reported activity level.  Assessment/Plan:  End stage arthritis, left hip  The patient history, physical examination, clinical judgement of the provider and imaging studies are consistent with end stage degenerative joint disease of the left hip and total hip arthroplasty is deemed medically necessary. The treatment options including medical management, injection therapy, arthroscopy and  arthroplasty were discussed at length. The risks and benefits of total hip arthroplasty were presented and reviewed. The risks due to aseptic loosening, infection, stiffness, dislocation/subluxation, thromboembolic complications and other imponderables were discussed. The patient acknowledged the explanation, agreed to proceed with the plan and consent was signed. Patient is being admitted for inpatient treatment for surgery, pain control, PT, OT, prophylactic antibiotics, VTE prophylaxis, progressive ambulation and ADLs and discharge planning.The patient is planning to be discharged home.   Patient's anticipated LOS is less than 2 midnights, meeting these requirements: - Lives within 1 hour of care - Has a competent adult at home to recover with post-op recover - NO history of  - Chronic pain requiring opioids  - Diabetes  - Coronary Artery Disease  - Heart failure  - Heart attack  - Stroke  - DVT/VTE  - Cardiac arrhythmia  - Respiratory Failure/COPD  - Renal failure  - Anemia  - Advanced Liver disease  Therapy Plans: HEP  Disposition: Home with wife Planned DVT Prophylaxis: 81 mg Aspirin BID  DME Needed: will call insurance to see cost, will let us  know*  PCP: Lonell Collet, DO (clearance received)  TXA: IV  Allergies: Clindamycin  (rash), Meropenem  (rash), Penicillin (rash) Metal Allergies: none  Anesthesia Concerns: none BMI: 34.7  Last HgbA1c: not diabetic, 5.5% on 04/27/23  Pharmacy: [facility] to bring on DC  Pain regimen: Hydrocodone, Tramadol, Tylenol     - Patient was instructed on what medications to stop prior to surgery. - Follow-up visit in 2 weeks with Dr. Melodi - Begin physical therapy following surgery - Pre-operative lab work as pre-surgical testing - Prescriptions will be provided in hospital at time of discharge  Roxie Mess, PA-C Orthopedic Surgery [facility] [place]

## 2024-02-22 ENCOUNTER — Encounter (HOSPITAL_COMMUNITY): Payer: Self-pay

## 2024-02-22 NOTE — Patient Instructions (Addendum)
 SURGICAL WAITING ROOM VISITATION  Patients having surgery or a procedure may have no more than 2 support people in the waiting area - these visitors may rotate.    Children under the age of 60 must have an adult with them who is not the patient.  Visitors with respiratory illnesses are discouraged from visiting and should remain at home.  If the patient needs to stay at the hospital during part of their recovery, the visitor guidelines for inpatient rooms apply. Pre-op nurse will coordinate an appropriate time for 1 support person to accompany patient in pre-op.  This support person may not rotate.    Please refer to the [facility] website for the visitor guidelines for Inpatients (after your surgery is over and you are in a regular room).       Your procedure is scheduled on: 03-14-24   Report to [facility] Main Entrance    Report to admitting at        0630 AM   Call this number if you have problems the morning of surgery [phone]   Do not eat food :After Midnight.   After Midnight you may have the following liquids until _0530 _____ AM/ DAY OF SURGERY   then nothing by mouth  Water Non-Citrus Juices (without pulp, NO RED-Apple, White grape, White cranberry) Black Coffee (NO MILK/CREAM OR CREAMERS, sugar ok)  Clear Tea (NO MILK/CREAM OR CREAMERS, sugar ok) regular and decaf                             Plain Jell-O (NO RED)                                           Fruit ices (not with fruit pulp, NO RED)                                     Popsicles (NO RED)                                                               Sports drinks like Gatorade (NO RED)                  The day of surgery:  Drink ONE (1) Pre-Surgery Clear Ensure BY      0530 AM the morning of surgery. Drink in one sitting. Do not sip.  This drink was given to you during your hospital  pre-op appointment visit. Nothing else to drink after completing the  Pre-Surgery Clear Ensure .           If you have questions, please contact your surgeon's office.   FOLLOW  ANY ADDITIONAL PRE OP INSTRUCTIONS YOU RECEIVED FROM YOUR SURGEON'S OFFICE!!!     Oral Hygiene is also important to reduce your risk of infection.                                    Remember - BRUSH YOUR TEETH THE  MORNING OF SURGERY WITH YOUR REGULAR TOOTHPASTE  DENTURES WILL BE REMOVED PRIOR TO SURGERY PLEASE DO NOT APPLY Poly grip OR ADHESIVES!!!   Do NOT smoke after Midnight   Stop all vitamins and herbal supplements 7 days before surgery.   Take these medicines the morning of surgery with A SIP OF WATER: Tamsulosin, Tramadol if needed, Carvedilol , Wellbutrin , gabapentin, citalopram    Bring CPAP mask and tubing day of surgery.                              You may not have any metal on your body including hair pins, jewelry, and body piercing             Do not wear  lotions, powders, /cologne, or deodorant               Men may shave face and neck.   Do not bring valuables to the hospital. [facility] IS NOT             RESPONSIBLE   FOR VALUABLES.   Contacts, glasses, dentures or bridgework may not be worn into surgery.   Bring small overnight bag day of surgery.   DO NOT BRING YOUR HOME MEDICATIONS TO THE HOSPITAL. PHARMACY WILL DISPENSE MEDICATIONS LISTED ON YOUR MEDICATION LIST TO YOU DURING YOUR ADMISSION IN THE HOSPITAL!    Patients discharged on the day of surgery will not be allowed to drive home.  Someone NEEDS to stay with you for the first 24 hours after anesthesia.   Special Instructions: Bring a copy of your healthcare power of attorney and living will documents the day of surgery if you haven't scanned them before.              Please read over the following fact sheets you were given: IF YOU HAVE QUESTIONS ABOUT YOUR PRE-OP INSTRUCTIONS PLEASE CALL [phone].    If you test positive for Covid or have been in contact with anyone that has tested positive in the last 10 days please  notify you surgeon.      Pre-operative 4 CHG Bath Instructions  DYNA-Hex 4 Chlorhexidine Gluconate 4% Solution Antiseptic 4 fl. oz   You can play a key role in reducing the risk of infection after surgery. Your skin needs to be as free of germs as possible. You can reduce the number of germs on your skin by washing with CHG (chlorhexidine gluconate) soap before surgery. CHG is an antiseptic soap that kills germs and continues to kill germs even after washing.   DO NOT use if you have an allergy to chlorhexidine/CHG or antibacterial soaps. If your skin becomes reddened or irritated, stop using the CHG and notify one of our RNs at [phone]  Please shower with the CHG soap starting 4 days before surgery using the following schedule:     Please keep in mind the following:  DO NOT shave, including legs and underarms, starting the day of your first shower.   You may shave your face at any point before/day of surgery.  Place clean sheets on your bed the day you start using CHG soap. Use a clean washcloth (not used since being washed) for each shower. DO NOT sleep with pets once you start using the CHG.  CHG Shower Instructions:  If you choose to wash your hair and private area, wash first with your normal shampoo/soap.  After you use shampoo/soap, rinse your hair  and body thoroughly to remove shampoo/soap residue.  Turn the water OFF and apply about 3 tablespoons (45 ml) of CHG soap to a CLEAN washcloth.  Apply CHG soap ONLY FROM YOUR NECK DOWN TO YOUR TOES (washing for 3-5 minutes)  DO NOT use CHG soap on face, private areas, open wounds, or sores.  Pay special attention to the area where your surgery is being performed.  If you are having back surgery, having someone wash your back for you may be helpful. Wait 2 minutes after CHG soap is applied, then you may rinse off the CHG soap.  Pat dry with a clean towel  Put on clean clothes/pajamas   If you choose to wear lotion, please use ONLY  the CHG-compatible lotions on the back of this paper.     Additional instructions for the day of surgery: DO NOT APPLY any lotions, deodorants, cologne, or perfumes.   Put on clean/comfortable clothes.  Brush your teeth.  Ask your nurse before applying any prescription medications to the skin.   CHG Compatible Lotions   Aveeno Moisturizing lotion  Cetaphil Moisturizing Cream  Cetaphil Moisturizing Lotion  Clairol Herbal Essence Moisturizing Lotion, Dry Skin  Clairol Herbal Essence Moisturizing Lotion, Extra Dry Skin  Clairol Herbal Essence Moisturizing Lotion, Normal Skin  Curel Age Defying Therapeutic Moisturizing Lotion with Alpha Hydroxy  Curel Extreme Care Body Lotion  Curel Soothing Hands Moisturizing Hand Lotion  Curel Therapeutic Moisturizing Cream, Fragrance-Free  Curel Therapeutic Moisturizing Lotion, Fragrance-Free  Curel Therapeutic Moisturizing Lotion, Original Formula  Eucerin Daily Replenishing Lotion  Eucerin Dry Skin Therapy Plus Alpha Hydroxy Crme  Eucerin Dry Skin Therapy Plus Alpha Hydroxy Lotion  Eucerin Original Crme  Eucerin Original Lotion  Eucerin Plus Crme Eucerin Plus Lotion  Eucerin TriLipid Replenishing Lotion  Keri Anti-Bacterial Hand Lotion  Keri Deep Conditioning Original Lotion Dry Skin Formula Softly Scented  Keri Deep Conditioning Original Lotion, Fragrance Free Sensitive Skin Formula  Keri Lotion Fast Absorbing Fragrance Free Sensitive Skin Formula  Keri Lotion Fast Absorbing Softly Scented Dry Skin Formula  Keri Original Lotion  Keri Skin Renewal Lotion Keri Silky Smooth Lotion  Keri Silky Smooth Sensitive Skin Lotion  Nivea Body Creamy Conditioning Oil  Nivea Body Extra Enriched Lotion  Nivea Body Original Lotion  Nivea Body Sheer Moisturizing Lotion Nivea Crme  Nivea Skin Firming Lotion  NutraDerm 30 Skin Lotion  NutraDerm Skin Lotion  NutraDerm Therapeutic Skin Cream  NutraDerm Therapeutic Skin Lotion  ProShield Protective Hand  Cream   Incentive Spirometer  An incentive spirometer is a tool that can help keep your lungs clear and active. This tool measures how well you are filling your lungs with each breath. Taking long deep breaths may help reverse or decrease the chance of developing breathing (pulmonary) problems (especially infection) following: A long period of time when you are unable to move or be active. BEFORE THE PROCEDURE  If the spirometer includes an indicator to show your best effort, your nurse or respiratory therapist will set it to a desired goal. If possible, sit up straight or lean slightly forward. Try not to slouch. Hold the incentive spirometer in an upright position. INSTRUCTIONS FOR USE  Sit on the edge of your bed if possible, or sit up as far as you can in bed or on a chair. Hold the incentive spirometer in an upright position. Breathe out normally. Place the mouthpiece in your mouth and seal your lips tightly around it. Breathe in slowly and as deeply as possible,  raising the piston or the ball toward the top of the column. Hold your breath for 3-5 seconds or for as long as possible. Allow the piston or ball to fall to the bottom of the column. Remove the mouthpiece from your mouth and breathe out normally. Rest for a few seconds and repeat Steps 1 through 7 at least 10 times every 1-2 hours when you are awake. Take your time and take a few normal breaths between deep breaths. The spirometer may include an indicator to show your best effort. Use the indicator as a goal to work toward during each repetition. After each set of 10 deep breaths, practice coughing to be sure your lungs are clear. If you have an incision (the cut made at the time of surgery), support your incision when coughing by placing a pillow or rolled up towels firmly against it. Once you are able to get out of bed, walk around indoors and cough well. You may stop using the incentive spirometer when instructed by your  caregiver.  RISKS AND COMPLICATIONS Take your time so you do not get dizzy or light-headed. If you are in pain, you may need to take or ask for pain medication before doing incentive spirometry. It is harder to take a deep breath if you are having pain. AFTER USE Rest and breathe slowly and easily. It can be helpful to keep track of a log of your progress. Your caregiver can provide you with a simple table to help with this. If you are using the spirometer at home, follow these instructions: SEEK MEDICAL CARE IF:  You are having difficultly using the spirometer. You have trouble using the spirometer as often as instructed. Your pain medication is not giving enough relief while using the spirometer. You develop fever of 100.5 F (38.1 C) or higher. SEEK IMMEDIATE MEDICAL CARE IF:  You cough up bloody sputum that had not been present before. You develop fever of 102 F (38.9 C) or greater. You develop worsening pain at or near the incision site. MAKE SURE YOU:  Understand these instructions. Will watch your condition. Will get help right away if you are not doing well or get worse. Document Released: 08/09/2006 Document Revised: 06/21/2011 Document Reviewed: 10/10/2006 Exit[employer] Patient Information 2014 [employer], [place].   ________________________________________________________________________ WHAT IS A BLOOD TRANSFUSION? Blood Transfusion Information  A transfusion is the replacement of blood or some of its parts. Blood is made up of multiple cells which provide different functions. Red blood cells carry oxygen and are used for blood loss replacement. White blood cells fight against infection. Platelets control bleeding. Plasma helps clot blood. Other blood products are available for specialized needs, such as hemophilia or other clotting disorders. BEFORE THE TRANSFUSION  Who gives blood for transfusions?  Healthy volunteers who are fully evaluated to make sure their blood is  safe. This is blood bank blood. Transfusion therapy is the safest it has ever been in the practice of medicine. Before blood is taken from a donor, a complete history is taken to make sure that person has no history of diseases nor engages in risky social behavior (examples are intravenous drug use or sexual activity with multiple partners). The donor's travel history is screened to minimize risk of transmitting infections, such as malaria. The donated blood is tested for signs of infectious diseases, such as HIV and hepatitis. The blood is then tested to be sure it is compatible with you in order to minimize the chance of a transfusion reaction.  If you or a relative donates blood, this is often done in anticipation of surgery and is not appropriate for emergency situations. It takes many days to process the donated blood. RISKS AND COMPLICATIONS Although transfusion therapy is very safe and saves many lives, the main dangers of transfusion include:  Getting an infectious disease. Developing a transfusion reaction. This is an allergic reaction to something in the blood you were given. Every precaution is taken to prevent this. The decision to have a blood transfusion has been considered carefully by your caregiver before blood is given. Blood is not given unless the benefits outweigh the risks. AFTER THE TRANSFUSION Right after receiving a blood transfusion, you will usually feel much better and more energetic. This is especially true if your red blood cells have gotten low (anemic). The transfusion raises the level of the red blood cells which carry oxygen, and this usually causes an energy increase. The nurse administering the transfusion will monitor you carefully for complications. HOME CARE INSTRUCTIONS  No special instructions are needed after a transfusion. You may find your energy is better. Speak with your caregiver about any limitations on activity for underlying diseases you may have. SEEK  MEDICAL CARE IF:  Your condition is not improving after your transfusion. You develop redness or irritation at the intravenous (IV) site. SEEK IMMEDIATE MEDICAL CARE IF:  Any of the following symptoms occur over the next 12 hours: Shaking chills. You have a temperature by mouth above 102 F (38.9 C), not controlled by medicine. Chest, back, or muscle pain. People around you feel you are not acting correctly or are confused. Shortness of breath or difficulty breathing. Dizziness and fainting. You get a rash or develop hives. You have a decrease in urine output. Your urine turns a dark color or changes to pink, red, or brown. Any of the following symptoms occur over the next 10 days: You have a temperature by mouth above 102 F (38.9 C), not controlled by medicine. Shortness of breath. Weakness after normal activity. The white part of the eye turns yellow (jaundice). You have a decrease in the amount of urine or are urinating less often. Your urine turns a dark color or changes to pink, red, or brown. Document Released: 03/26/2000 Document Revised: 06/21/2011 Document Reviewed: 11/13/2007 [facility] Patient Information 2014 [place], [place].  _______________________________________________________________________

## 2024-02-22 NOTE — Progress Notes (Addendum)
 PCP - Lonell Gerome HAS clearance 12-15-23 media Cardiologist - n/a  PPM/ICD -  Device Orders -  Rep Notified -   Chest x-ray -  EKG - 10-23-23 epic Stress Test -  ECHO - 10-23-19 epic Cardiac Cath -   Sleep Study - yes  CPAP - yes  Fasting Blood Sugar - n/a Checks Blood Sugar __n/a___ times a day  Blood Thinner Instructions: Aspirin Instructions:81 mg asa    ERAS Protcol - PRE-SURGERY- N/a   COVID vaccine -  Activity--Able to complete ADL's with no CP or SOB Anesthesia review: OSA , HTN  Patient denies shortness of breath, fever, cough and chest pain at PAT appointment   All instructions explained to the patient, with a verbal understanding of the material. Patient agrees to go over the instructions while at home for a better understanding. Patient also instructed to self quarantine after being tested for COVID-19. The opportunity to ask questions was provided.

## 2024-03-07 ENCOUNTER — Other Ambulatory Visit: Payer: Self-pay

## 2024-03-07 ENCOUNTER — Encounter (HOSPITAL_COMMUNITY): Payer: Self-pay

## 2024-03-07 ENCOUNTER — Encounter (HOSPITAL_COMMUNITY)
Admission: RE | Admit: 2024-03-07 | Discharge: 2024-03-07 | Disposition: A | Discharge status: Home / Self Care | Source: Ambulatory Visit | Attending: Orthopedic Surgery | Admitting: Orthopedic Surgery

## 2024-03-07 VITALS — BP 133/72 | HR 63 | Temp 98.7°F | Resp 16 | Ht 68.0 in | Wt 233.0 lb

## 2024-03-07 DIAGNOSIS — Z01818 Encounter for other preprocedural examination: Secondary | ICD-10-CM

## 2024-03-07 DIAGNOSIS — I10 Essential (primary) hypertension: Secondary | ICD-10-CM | POA: Diagnosis not present

## 2024-03-07 DIAGNOSIS — Z01812 Encounter for preprocedural laboratory examination: Secondary | ICD-10-CM | POA: Diagnosis not present

## 2024-03-07 HISTORY — DX: Sleep apnea, unspecified: G47.30

## 2024-03-07 HISTORY — DX: Personal history of urinary calculi: Z87.442

## 2024-03-07 HISTORY — DX: Pneumonia, unspecified organism: J18.9

## 2024-03-07 LAB — CBC
HCT: 36.5 % — ABNORMAL LOW (ref 39.0–52.0)
Hemoglobin: 12 g/dL — ABNORMAL LOW (ref 13.0–17.0)
MCH: 30.1 pg (ref 26.0–34.0)
MCHC: 32.9 g/dL (ref 30.0–36.0)
MCV: 91.5 fL (ref 80.0–100.0)
Platelets: 176 K/uL (ref 150–400)
RBC: 3.99 MIL/uL — ABNORMAL LOW (ref 4.22–5.81)
RDW: 12.4 % (ref 11.5–15.5)
WBC: 5.5 K/uL (ref 4.0–10.5)
nRBC: 0 % (ref 0.0–0.2)

## 2024-03-07 LAB — SURGICAL PCR SCREEN
MRSA, PCR: NEGATIVE
Staphylococcus aureus: POSITIVE — AB

## 2024-03-13 NOTE — Anesthesia Preprocedure Evaluation (Signed)
 Anesthesia Evaluation  Patient identified by MRN, date of birth, ID band Patient awake    Reviewed: Allergy & Precautions, NPO status , Patient's Chart, lab work & pertinent test results, reviewed documented beta blocker date and time   History of Anesthesia Complications Negative for: history of anesthetic complications  Airway Mallampati: III  TM Distance: >3 FB Neck ROM: Full    Dental no notable dental hx. (+) Dental Advisory Given   Pulmonary shortness of breath and with exertion, sleep apnea and Continuous Positive Airway Pressure Ventilation , pneumonia, former smoker Right empyema   Pulmonary exam normal breath sounds clear to auscultation       Cardiovascular hypertension, Pt. on medications and Pt. on home beta blockers Normal cardiovascular exam+ dysrhythmias  Rhythm:Regular Rate:Normal - Diastolic murmurs Echo 2021  1. Left ventricular ejection fraction, by estimation, is 50 to 55%. The left  ventricle has low normal function. The left ventricle has no regional wall  motion abnormalities. The left ventricular internal cavity size was mildly  dilated. Left ventricular diastolic parameters are consistent with Grade I  diastolic dysfunction (impaired relaxation).   2. Right ventricular systolic function is normal. The right ventricular  size is normal.   3. The mitral valve is normal in structure. Trivial mitral valve  regurgitation. No evidence of mitral stenosis.   4. The aortic valve is tricuspid. Aortic valve regurgitation is trivial. No  aortic stenosis is present.   5. Aortic dilatation noted. There is mild dilatation of the aortic root  measuring 43 mm.   6. The inferior vena cava is normal in size with greater than 50%  respiratory variability, suggesting right atrial pressure of 3 mmHg.      Neuro/Psych  PSYCHIATRIC DISORDERS  Depression       GI/Hepatic negative GI ROS, Neg liver ROS,,,  Endo/Other     Class 3 obesity  Renal/GU Renal disease     Musculoskeletal  (+) Arthritis ,    Abdominal  (+) + obese  Peds  Hematology  (+) Blood dyscrasia, anemia   Anesthesia Other Findings   Reproductive/Obstetrics                              Anesthesia Physical Anesthesia Plan  ASA: 3  Anesthesia Plan: General   Post-op Pain Management: Tylenol  PO (pre-op)* and Gabapentin PO (pre-op)*   Induction: Intravenous  PONV Risk Score and Plan: 3 and Ondansetron , Propofol  infusion, TIVA, Treatment may vary due to age or medical condition and Dexamethasone   Airway Management Planned: Oral ETT  Additional Equipment:   Intra-op Plan:   Post-operative Plan: Extubation in OR  Informed Consent: I have reviewed the patients History and Physical, chart, labs and discussed the procedure including the risks, benefits and alternatives for the proposed anesthesia with the patient or authorized representative who has indicated his/her understanding and acceptance.     Dental advisory given  Plan Discussed with: CRNA  Anesthesia Plan Comments: (Risks of anesthesia explained at length. This includes, but is not limited to, sore throat, damage to teeth, lips gums, tongue and vocal cords, nausea and vomiting, reactions to medications, stroke, heart attack, and death. All patient questions were answered and the patient wishes to proceed.  Discussed with patient, family and Dr. Melodi dilated aorta on echo from 2021. No repeat imaging. Pt is asymptomatic and wishes to proceed with surgery. We had a long discussion regarding the increases but unquantifiable risk with proceeding.  The patient and family agrees and understands and wishes to proceed with surgery. )         Anesthesia Quick Evaluation

## 2024-03-14 ENCOUNTER — Ambulatory Visit (HOSPITAL_COMMUNITY): Payer: Self-pay | Admitting: Anesthesiology

## 2024-03-14 ENCOUNTER — Ambulatory Visit (HOSPITAL_COMMUNITY): Payer: Self-pay | Admitting: Medical

## 2024-03-14 ENCOUNTER — Encounter (HOSPITAL_COMMUNITY): Admission: RE | Disposition: A | Payer: Self-pay | Source: Home / Self Care | Attending: Orthopedic Surgery

## 2024-03-14 ENCOUNTER — Ambulatory Visit (HOSPITAL_COMMUNITY)

## 2024-03-14 ENCOUNTER — Ambulatory Visit (HOSPITAL_COMMUNITY)
Admission: RE | Admit: 2024-03-14 | Discharge: 2024-03-15 | Disposition: A | Attending: Orthopedic Surgery | Admitting: Orthopedic Surgery

## 2024-03-14 ENCOUNTER — Encounter (HOSPITAL_COMMUNITY): Payer: Self-pay | Admitting: Orthopedic Surgery

## 2024-03-14 ENCOUNTER — Other Ambulatory Visit: Payer: Self-pay

## 2024-03-14 DIAGNOSIS — I10 Essential (primary) hypertension: Secondary | ICD-10-CM

## 2024-03-14 DIAGNOSIS — Z87891 Personal history of nicotine dependence: Secondary | ICD-10-CM | POA: Diagnosis not present

## 2024-03-14 DIAGNOSIS — M25752 Osteophyte, left hip: Secondary | ICD-10-CM | POA: Insufficient documentation

## 2024-03-14 DIAGNOSIS — M169 Osteoarthritis of hip, unspecified: Secondary | ICD-10-CM | POA: Diagnosis present

## 2024-03-14 DIAGNOSIS — Z01818 Encounter for other preprocedural examination: Secondary | ICD-10-CM

## 2024-03-14 DIAGNOSIS — E66813 Obesity, class 3: Secondary | ICD-10-CM | POA: Insufficient documentation

## 2024-03-14 DIAGNOSIS — M1612 Unilateral primary osteoarthritis, left hip: Secondary | ICD-10-CM | POA: Insufficient documentation

## 2024-03-14 DIAGNOSIS — G4730 Sleep apnea, unspecified: Secondary | ICD-10-CM

## 2024-03-14 DIAGNOSIS — G4733 Obstructive sleep apnea (adult) (pediatric): Secondary | ICD-10-CM | POA: Insufficient documentation

## 2024-03-14 DIAGNOSIS — I499 Cardiac arrhythmia, unspecified: Secondary | ICD-10-CM | POA: Insufficient documentation

## 2024-03-14 DIAGNOSIS — Z96642 Presence of left artificial hip joint: Secondary | ICD-10-CM | POA: Diagnosis not present

## 2024-03-14 DIAGNOSIS — Z6835 Body mass index (BMI) 35.0-35.9, adult: Secondary | ICD-10-CM | POA: Insufficient documentation

## 2024-03-14 DIAGNOSIS — Z471 Aftercare following joint replacement surgery: Secondary | ICD-10-CM | POA: Diagnosis not present

## 2024-03-14 HISTORY — PX: TOTAL HIP ARTHROPLASTY: SHX124

## 2024-03-14 LAB — TYPE AND SCREEN
ABO/RH(D): A POS
Antibody Screen: NEGATIVE

## 2024-03-14 LAB — BASIC METABOLIC PANEL WITH GFR
Anion gap: 10 (ref 5–15)
BUN: 13 mg/dL (ref 8–23)
CO2: 25 mmol/L (ref 22–32)
Calcium: 9 mg/dL (ref 8.9–10.3)
Chloride: 108 mmol/L (ref 98–111)
Creatinine, Ser: 1.15 mg/dL (ref 0.61–1.24)
GFR, Estimated: >60 mL/min (ref >60)
Glucose, Bld: 111 mg/dL — ABNORMAL HIGH (ref 70–99)
Potassium: 3.4 mmol/L — ABNORMAL LOW (ref 3.5–5.1)
Sodium: 144 mmol/L (ref 135–145)

## 2024-03-14 SURGERY — ARTHROPLASTY, HIP, TOTAL, ANTERIOR APPROACH
Anesthesia: General | Site: Hip | Laterality: Left

## 2024-03-14 MED ORDER — HYDROMORPHONE HCL 1 MG/ML IJ SOLN
0.2500 mg | INTRAMUSCULAR | Status: DC | PRN
  Administered 2024-03-14 (×3): 0.5 mg via INTRAVENOUS

## 2024-03-14 MED ORDER — CARVEDILOL 3.125 MG PO TABS
3.1250 mg | ORAL_TABLET | Freq: Two times a day (BID) | ORAL | Status: DC
  Filled 2024-03-14 (×2): qty 1

## 2024-03-14 MED ORDER — BUPIVACAINE-EPINEPHRINE (PF) 0.25% -1:200000 IJ SOLN
INTRAMUSCULAR | Status: AC
  Filled 2024-03-14: qty 30

## 2024-03-14 MED ORDER — HYDROMORPHONE HCL 1 MG/ML IJ SOLN
INTRAMUSCULAR | Status: AC
  Filled 2024-03-14: qty 1

## 2024-03-14 MED ORDER — BISACODYL 10 MG RE SUPP: 10.0000 mg | Freq: Every day | RECTAL | Status: DC | PRN

## 2024-03-14 MED ORDER — PROPOFOL 1000 MG/100ML IV EMUL
INTRAVENOUS | Status: AC
  Filled 2024-03-14: qty 100

## 2024-03-14 MED ORDER — SUGAMMADEX SODIUM 200 MG/2ML IV SOLN
INTRAVENOUS | Status: DC | PRN
  Administered 2024-03-14: 200 mg via INTRAVENOUS

## 2024-03-14 MED ORDER — DEXAMETHASONE SOD PHOSPHATE PF 10 MG/ML IJ SOLN
10.0000 mg | Freq: Once | INTRAMUSCULAR | Status: AC
  Administered 2024-03-15: 10 mg via INTRAVENOUS

## 2024-03-14 MED ORDER — EPHEDRINE SULFATE (PRESSORS) 25 MG/5ML IV SOSY
PREFILLED_SYRINGE | INTRAVENOUS | Status: DC | PRN
  Administered 2024-03-14: 10 mg via INTRAVENOUS
  Administered 2024-03-14: 5 mg via INTRAVENOUS
  Administered 2024-03-14: 10 mg via INTRAVENOUS

## 2024-03-14 MED ORDER — ONDANSETRON HCL 4 MG/2ML IJ SOLN
INTRAMUSCULAR | Status: AC
  Filled 2024-03-14: qty 2

## 2024-03-14 MED ORDER — LOPERAMIDE HCL 2 MG PO CAPS
2.0000 mg | ORAL_CAPSULE | Freq: Two times a day (BID) | ORAL | Status: DC
  Administered 2024-03-14: 2 mg via ORAL
  Filled 2024-03-14 (×2): qty 1

## 2024-03-14 MED ORDER — CEFAZOLIN SODIUM-DEXTROSE 2-4 GM/100ML-% IV SOLN
2.0000 g | Freq: Four times a day (QID) | INTRAVENOUS | Status: AC
  Administered 2024-03-14 (×2): 2 g via INTRAVENOUS
  Filled 2024-03-14 (×2): qty 100

## 2024-03-14 MED ORDER — SIMVASTATIN 20 MG PO TABS
10.0000 mg | ORAL_TABLET | Freq: Every day | ORAL | Status: DC
  Administered 2024-03-14: 10 mg via ORAL
  Filled 2024-03-14: qty 1

## 2024-03-14 MED ORDER — MAGNESIUM CITRATE PO SOLN: 1.0000 | Freq: Once | ORAL | Status: DC | PRN

## 2024-03-14 MED ORDER — ROCURONIUM BROMIDE 100 MG/10ML IV SOLN
INTRAVENOUS | Status: DC | PRN
  Administered 2024-03-14: 50 mg via INTRAVENOUS

## 2024-03-14 MED ORDER — ONDANSETRON HCL 4 MG/2ML IJ SOLN: 4.0000 mg | Freq: Four times a day (QID) | INTRAMUSCULAR | Status: DC | PRN

## 2024-03-14 MED ORDER — ASPIRIN 81 MG PO CHEW
81.0000 mg | CHEWABLE_TABLET | Freq: Two times a day (BID) | ORAL | Status: DC
  Administered 2024-03-15: 81 mg via ORAL
  Filled 2024-03-14: qty 1

## 2024-03-14 MED ORDER — HYDROMORPHONE HCL 1 MG/ML IJ SOLN: 1.0000 mg | INTRAMUSCULAR | Status: DC | PRN

## 2024-03-14 MED ORDER — GABAPENTIN 300 MG PO CAPS
600.0000 mg | ORAL_CAPSULE | Freq: Two times a day (BID) | ORAL | Status: DC
  Administered 2024-03-14 – 2024-03-15 (×2): 600 mg via ORAL
  Filled 2024-03-14 (×2): qty 2

## 2024-03-14 MED ORDER — FENTANYL CITRATE (PF) 100 MCG/2ML IJ SOLN
INTRAMUSCULAR | Status: AC
  Filled 2024-03-14: qty 2

## 2024-03-14 MED ORDER — ONDANSETRON HCL 4 MG/2ML IJ SOLN
INTRAMUSCULAR | Status: DC | PRN
  Administered 2024-03-14: 4 mg via INTRAVENOUS

## 2024-03-14 MED ORDER — POVIDONE-IODINE 10 % EX SWAB: 2.0000 | Freq: Once | CUTANEOUS | Status: DC

## 2024-03-14 MED ORDER — DEXAMETHASONE SOD PHOSPHATE PF 10 MG/ML IJ SOLN
8.0000 mg | Freq: Once | INTRAMUSCULAR | Status: AC
  Administered 2024-03-14: 8 mg via INTRAVENOUS

## 2024-03-14 MED ORDER — PHENOL 1.4 % MT LIQD: 1.0000 | OROMUCOSAL | Status: DC | PRN

## 2024-03-14 MED ORDER — SODIUM CHLORIDE 0.9 % IV SOLN: INTRAVENOUS | Status: DC

## 2024-03-14 MED ORDER — GABAPENTIN 300 MG PO CAPS
300.0000 mg | ORAL_CAPSULE | Freq: Once | ORAL | Status: DC
  Filled 2024-03-14: qty 1

## 2024-03-14 MED ORDER — LACTATED RINGERS IV SOLN: INTRAVENOUS | Status: DC

## 2024-03-14 MED ORDER — 0.9 % SODIUM CHLORIDE (POUR BTL) OPTIME
TOPICAL | Status: DC | PRN
  Administered 2024-03-14: 1000 mL

## 2024-03-14 MED ORDER — ONDANSETRON HCL 4 MG PO TABS: 4.0000 mg | ORAL_TABLET | Freq: Four times a day (QID) | ORAL | Status: DC | PRN

## 2024-03-14 MED ORDER — TAMSULOSIN HCL 0.4 MG PO CAPS
0.4000 mg | ORAL_CAPSULE | Freq: Every day | ORAL | Status: DC
  Administered 2024-03-15: 0.4 mg via ORAL
  Filled 2024-03-14: qty 1

## 2024-03-14 MED ORDER — TRANEXAMIC ACID-NACL 1000-0.7 MG/100ML-% IV SOLN
1000.0000 mg | INTRAVENOUS | Status: AC
  Administered 2024-03-14: 1000 mg via INTRAVENOUS
  Filled 2024-03-14: qty 100

## 2024-03-14 MED ORDER — ACETAMINOPHEN 325 MG PO TABS
325.0000 mg | ORAL_TABLET | Freq: Four times a day (QID) | ORAL | Status: DC | PRN
  Administered 2024-03-15: 650 mg via ORAL
  Filled 2024-03-14: qty 2

## 2024-03-14 MED ORDER — POLYETHYLENE GLYCOL 3350 17 G PO PACK: 17.0000 g | PACK | Freq: Every day | ORAL | Status: DC | PRN

## 2024-03-14 MED ORDER — CITALOPRAM HYDROBROMIDE 20 MG PO TABS
20.0000 mg | ORAL_TABLET | Freq: Every day | ORAL | Status: DC
  Administered 2024-03-15: 20 mg via ORAL
  Filled 2024-03-14: qty 1

## 2024-03-14 MED ORDER — EPHEDRINE 5 MG/ML INJ
INTRAVENOUS | Status: AC
  Filled 2024-03-14: qty 5

## 2024-03-14 MED ORDER — BUPROPION HCL ER (XL) 300 MG PO TB24
300.0000 mg | ORAL_TABLET | Freq: Every day | ORAL | Status: DC
  Administered 2024-03-15: 300 mg via ORAL
  Filled 2024-03-14: qty 1

## 2024-03-14 MED ORDER — MENTHOL 3 MG MT LOZG: 1.0000 | LOZENGE | OROMUCOSAL | Status: DC | PRN

## 2024-03-14 MED ORDER — WATER FOR IRRIGATION, STERILE IR SOLN
Status: DC | PRN
  Administered 2024-03-14: 2000 mL

## 2024-03-14 MED ORDER — METHOCARBAMOL 1000 MG/10ML IJ SOLN: 500.0000 mg | Freq: Four times a day (QID) | INTRAMUSCULAR | Status: DC | PRN

## 2024-03-14 MED ORDER — PHENYLEPHRINE HCL-NACL 20-0.9 MG/250ML-% IV SOLN
INTRAVENOUS | Status: DC | PRN
  Administered 2024-03-14: 15 ug/min via INTRAVENOUS

## 2024-03-14 MED ORDER — DROPERIDOL 2.5 MG/ML IJ SOLN: 0.6250 mg | Freq: Once | INTRAMUSCULAR | Status: DC | PRN

## 2024-03-14 MED ORDER — TRAMADOL HCL 50 MG PO TABS
50.0000 mg | ORAL_TABLET | Freq: Two times a day (BID) | ORAL | Status: DC
  Administered 2024-03-14 – 2024-03-15 (×2): 50 mg via ORAL
  Filled 2024-03-14 (×2): qty 1

## 2024-03-14 MED ORDER — ACETAMINOPHEN 500 MG PO TABS
1000.0000 mg | ORAL_TABLET | Freq: Four times a day (QID) | ORAL | Status: AC
  Administered 2024-03-14 – 2024-03-15 (×2): 1000 mg via ORAL
  Filled 2024-03-14 (×3): qty 2

## 2024-03-14 MED ORDER — CHLORHEXIDINE GLUCONATE 0.12 % MT SOLN
15.0000 mL | Freq: Once | OROMUCOSAL | Status: AC
  Administered 2024-03-14: 15 mL via OROMUCOSAL

## 2024-03-14 MED ORDER — ORAL CARE MOUTH RINSE: 15.0000 mL | Freq: Once | OROMUCOSAL | Status: AC

## 2024-03-14 MED ORDER — FENTANYL CITRATE (PF) 100 MCG/2ML IJ SOLN
INTRAMUSCULAR | Status: DC | PRN
  Administered 2024-03-14 (×4): 25 ug via INTRAVENOUS

## 2024-03-14 MED ORDER — DOCUSATE SODIUM 100 MG PO CAPS
100.0000 mg | ORAL_CAPSULE | Freq: Two times a day (BID) | ORAL | Status: DC
  Administered 2024-03-14 – 2024-03-15 (×2): 100 mg via ORAL
  Filled 2024-03-14 (×2): qty 1

## 2024-03-14 MED ORDER — BUPIVACAINE-EPINEPHRINE (PF) 0.25% -1:200000 IJ SOLN
INTRAMUSCULAR | Status: DC | PRN
  Administered 2024-03-14: 30 mL

## 2024-03-14 MED ORDER — ACETAMINOPHEN 10 MG/ML IV SOLN
1000.0000 mg | Freq: Four times a day (QID) | INTRAVENOUS | Status: DC
  Administered 2024-03-14: 1000 mg via INTRAVENOUS
  Filled 2024-03-14: qty 100

## 2024-03-14 MED ORDER — METHOCARBAMOL 500 MG PO TABS
500.0000 mg | ORAL_TABLET | Freq: Four times a day (QID) | ORAL | Status: DC | PRN
  Administered 2024-03-14 – 2024-03-15 (×2): 500 mg via ORAL
  Filled 2024-03-14 (×2): qty 1

## 2024-03-14 MED ORDER — CEFAZOLIN SODIUM-DEXTROSE 2-4 GM/100ML-% IV SOLN
2.0000 g | INTRAVENOUS | Status: AC
  Administered 2024-03-14: 2 g via INTRAVENOUS
  Filled 2024-03-14: qty 100

## 2024-03-14 MED ORDER — PROPOFOL 500 MG/50ML IV EMUL
INTRAVENOUS | Status: DC | PRN
  Administered 2024-03-14: 75 ug/kg/min via INTRAVENOUS
  Administered 2024-03-14: 150 mg via INTRAVENOUS

## 2024-03-14 MED ORDER — LOSARTAN POTASSIUM 25 MG PO TABS
25.0000 mg | ORAL_TABLET | Freq: Every day | ORAL | Status: DC
  Administered 2024-03-14 – 2024-03-15 (×2): 25 mg via ORAL
  Filled 2024-03-14 (×2): qty 1

## 2024-03-14 MED ORDER — LIDOCAINE HCL (PF) 2 % IJ SOLN
INTRAMUSCULAR | Status: DC | PRN
  Administered 2024-03-14: 100 mg via INTRADERMAL

## 2024-03-14 MED ORDER — OXYCODONE HCL 5 MG PO TABS
5.0000 mg | ORAL_TABLET | ORAL | Status: DC | PRN
  Administered 2024-03-14 – 2024-03-15 (×3): 5 mg via ORAL
  Filled 2024-03-14 (×3): qty 1

## 2024-03-14 SURGICAL SUPPLY — 35 items
BAG COUNTER SPONGE SURGICOUNT (BAG) IMPLANT
BAG ZIPLOCK 12X15 (MISCELLANEOUS) IMPLANT
BLADE SAG 18X100X1.27 (BLADE) ×1 IMPLANT
COVER PERINEAL POST (MISCELLANEOUS) ×1 IMPLANT
COVER SURGICAL LIGHT HANDLE (MISCELLANEOUS) ×1 IMPLANT
CUP ACET PINNACLE SECTR 56MM (Hips) IMPLANT
DERMABOND ADVANCED .7 DNX12 (GAUZE/BANDAGES/DRESSINGS) ×1 IMPLANT
DRAPE FOOT SWITCH (DRAPES) ×1 IMPLANT
DRAPE STERI IOBAN 125X83 (DRAPES) ×1 IMPLANT
DRAPE U-SHAPE 47X51 STRL (DRAPES) ×2 IMPLANT
DRSG AQUACEL AG ADV 3.5X10 (GAUZE/BANDAGES/DRESSINGS) ×1 IMPLANT
DURAPREP 26ML APPLICATOR (WOUND CARE) ×1 IMPLANT
ELECT REM PT RETURN 15FT ADLT (MISCELLANEOUS) ×1 IMPLANT
GLOVE BIO SURGEON STRL SZ 6.5 (GLOVE) IMPLANT
GLOVE BIO SURGEON STRL SZ7 (GLOVE) IMPLANT
GLOVE BIO SURGEON STRL SZ8 (GLOVE) ×1 IMPLANT
GLOVE BIOGEL PI IND STRL 7.0 (GLOVE) IMPLANT
GLOVE BIOGEL PI IND STRL 8 (GLOVE) ×1 IMPLANT
GOWN STRL REUS W/ TWL LRG LVL3 (GOWN DISPOSABLE) ×2 IMPLANT
HEAD CERAMIC DELTA 36 PLUS 1.5 (Hips) IMPLANT
HOLDER FOLEY CATH W/STRAP (MISCELLANEOUS) ×1 IMPLANT
KIT TURNOVER KIT A (KITS) ×1 IMPLANT
MANIFOLD NEPTUNE II (INSTRUMENTS) ×1 IMPLANT
PACK ANTERIOR HIP CUSTOM (KITS) ×1 IMPLANT
PENCIL SMOKE EVACUATOR COATED (MISCELLANEOUS) ×1 IMPLANT
PINNACLE ALTRX PLUS 4 N 36X56 (Hips) IMPLANT
SPIKE FLUID TRANSFER (MISCELLANEOUS) ×1 IMPLANT
STEM FEMORAL SZ 6MM STD ACTIS (Stem) IMPLANT
SUT ETHIBOND NAB CT1 #1 30IN (SUTURE) ×1 IMPLANT
SUT MNCRL AB 4-0 PS2 18 (SUTURE) ×1 IMPLANT
SUT VIC AB 2-0 CT1 TAPERPNT 27 (SUTURE) ×2 IMPLANT
SUTURE STRATFX 0 PDS 27 VIOLET (SUTURE) ×1 IMPLANT
TOWEL GREEN STERILE FF (TOWEL DISPOSABLE) ×1 IMPLANT
TRAY FOLEY MTR SLVR 16FR STAT (SET/KITS/TRAYS/PACK) ×1 IMPLANT
TUBE SUCTION HIGH CAP CLEAR NV (SUCTIONS) ×1 IMPLANT

## 2024-03-14 NOTE — Discharge Instructions (Signed)
Frank Aluisio, MD Total Joint Specialist [facility] [address] [place], [place] [address] [phone]  ANTERIOR APPROACH TOTAL HIP REPLACEMENT POSTOPERATIVE DIRECTIONS     Hip Rehabilitation, Guidelines Following Surgery  The results of a hip operation are greatly improved after range of motion and muscle strengthening exercises. Follow all safety measures which are given to protect your hip. If any of these exercises cause increased pain or swelling in your joint, decrease the amount until you are comfortable again. Then slowly increase the exercises. Call your caregiver if you have problems or questions.   BLOOD CLOT PREVENTION Take an 81 mg Aspirin two times a day for three weeks following surgery. Then take an 81 mg Aspirin once a day for three weeks. Then discontinue Aspirin. You may resume your vitamins/supplements upon discharge from the hospital. Do not take any NSAIDs (Advil, Aleve, Ibuprofen, Meloxicam, etc.) until you are 3 weeks out from surgery  HOME CARE INSTRUCTIONS  Remove items at home which could result in a fall. This includes throw rugs or furniture in walking pathways.  ICE to the affected hip as frequently as 20-30 minutes an hour and then as needed for pain and swelling. Continue to use ice on the hip for pain and swelling from surgery. You may notice swelling that will progress down to the foot and ankle. This is normal after surgery. Elevate the leg when you are not up walking on it.   Continue to use the breathing machine which will help keep your temperature down.  It is common for your temperature to cycle up and down following surgery, especially at night when you are not up moving around and exerting yourself.  The breathing machine keeps your lungs expanded and your temperature down.  DIET You may resume your previous home diet once your are discharged from the hospital.  DRESSING / WOUND CARE / SHOWERING You have an  adhesive waterproof bandage over the incision. Leave this in place until your first follow-up appointment. Once you remove this you will not need to place another bandage.  You may begin showering 3 days following surgery, but do not submerge the incision under water.  ACTIVITY For the first 3-5 days, it is important to rest and keep the operative leg elevated. You should, as a general rule, rest for 50 minutes and walk/stretch for 10 minutes per hour. After 5 days, you may slowly increase activity as tolerated.  Perform the exercises you were provided twice a day for about 15-20 minutes each session. Begin these 2 days following surgery. Walk with your walker as instructed. Use the walker until you are comfortable transitioning to a cane. Walk with the cane in the opposite hand of the operative leg. You may discontinue the cane once you are comfortable and walking steadily. Avoid periods of inactivity such as sitting longer than an hour when not asleep. This helps prevent blood clots.  Do not drive a car for 6 weeks or until released by your surgeon.  Do not drive while taking narcotics.  TED HOSE STOCKINGS Wear the elastic stockings on both legs for three weeks following surgery during the day. You may remove them at night while sleeping.  WEIGHT BEARING Weight bearing as tolerated with assist device (walker, cane, etc) as directed, use it as long as suggested by your surgeon or therapist, typically at least 4-6 weeks.  POSTOPERATIVE CONSTIPATION PROTOCOL Constipation - defined medically as fewer than three stools per week and severe constipation as   less than one stool per week.  One of the most common issues patients have following surgery is constipation.  Even if you have a regular bowel pattern at home, your normal regimen is likely to be disrupted due to multiple reasons following surgery.  Combination of anesthesia, postoperative narcotics, change in appetite and fluid intake all can  affect your bowels.  In order to avoid complications following surgery, here are some recommendations in order to help you during your recovery period.  Colace (docusate) - Pick up an over-the-counter form of Colace or another stool softener and take twice a day as long as you are requiring postoperative pain medications.  Take with a full glass of water daily.  If you experience loose stools or diarrhea, hold the colace until you stool forms back up.  If your symptoms do not get better within 1 week or if they get worse, check with your doctor. Dulcolax (bisacodyl) - Pick up over-the-counter and take as directed by the product packaging as needed to assist with the movement of your bowels.  Take with a full glass of water.  Use this product as needed if not relieved by Colace only.  MiraLax (polyethylene glycol) - Pick up over-the-counter to have on hand.  MiraLax is a solution that will increase the amount of water in your bowels to assist with bowel movements.  Take as directed and can mix with a glass of water, juice, soda, coffee, or tea.  Take if you go more than two days without a movement.Do not use MiraLax more than once per day. Call your doctor if you are still constipated or irregular after using this medication for 7 days in a row.  If you continue to have problems with postoperative constipation, please contact the office for further assistance and recommendations.  If you experience "the worst abdominal pain ever" or develop nausea or vomiting, please contact the office immediatly for further recommendations for treatment.  ITCHING  If you experience itching with your medications, try taking only a single pain pill, or even half a pain pill at a time.  You can also use Benadryl over the counter for itching or also to help with sleep.   MEDICATIONS See your medication summary on the "After Visit Summary" that the nursing staff will review with you prior to discharge.  You may have some home  medications which will be placed on hold until you complete the course of blood thinner medication.  It is important for you to complete the blood thinner medication as prescribed by your surgeon.  Continue your approved medications as instructed at time of discharge.  PRECAUTIONS If you experience chest pain or shortness of breath - call 911 immediately for transfer to the hospital emergency department.  If you develop a fever greater that 101 F, purulent drainage from wound, increased redness or drainage from wound, foul odor from the wound/dressing, or calf pain - CONTACT YOUR SURGEON.                                                   FOLLOW-UP APPOINTMENTS Make sure you keep all of your appointments after your operation with your surgeon and caregivers. You should call the office at the above phone number and make an appointment for approximately two weeks after the date of your surgery or on the   date instructed by your surgeon outlined in the "After Visit Summary".  RANGE OF MOTION AND STRENGTHENING EXERCISES  These exercises are designed to help you keep full movement of your hip joint. Follow your caregiver's or physical therapist's instructions. Perform all exercises about fifteen times, three times per day or as directed. Exercise both hips, even if you have had only one joint replacement. These exercises can be done on a training (exercise) mat, on the floor, on a table or on a bed. Use whatever works the best and is most comfortable for you. Use music or television while you are exercising so that the exercises are a pleasant break in your day. This will make your life better with the exercises acting as a break in routine you can look forward to.  Lying on your back, slowly slide your foot toward your buttocks, raising your knee up off the floor. Then slowly slide your foot back down until your leg is straight again.  Lying on your back spread your legs as far apart as you can without causing  discomfort.  Lying on your side, raise your upper leg and foot straight up from the floor as far as is comfortable. Slowly lower the leg and repeat.  Lying on your back, tighten up the muscle in the front of your thigh (quadriceps muscles). You can do this by keeping your leg straight and trying to raise your heel off the floor. This helps strengthen the largest muscle supporting your knee.  Lying on your back, tighten up the muscles of your buttocks both with the legs straight and with the knee bent at a comfortable angle while keeping your heel on the floor.   POST-OPERATIVE OPIOID TAPER INSTRUCTIONS: It is important to wean off of your opioid medication as soon as possible. If you do not need pain medication after your surgery it is ok to stop day one. Opioids include: Codeine, Hydrocodone(Norco, Vicodin), Oxycodone(Percocet, oxycontin) and hydromorphone amongst others.  Long term and even short term use of opiods can cause: Increased pain response Dependence Constipation Depression Respiratory depression And more.  Withdrawal symptoms can include Flu like symptoms Nausea, vomiting And more Techniques to manage these symptoms Hydrate well Eat regular healthy meals Stay active Use relaxation techniques(deep breathing, meditating, yoga) Do Not substitute Alcohol to help with tapering If you have been on opioids for less than two weeks and do not have pain than it is ok to stop all together.  Plan to wean off of opioids This plan should start within one week post op of your joint replacement. Maintain the same interval or time between taking each dose and first decrease the dose.  Cut the total daily intake of opioids by one tablet each day Next start to increase the time between doses. The last dose that should be eliminated is the evening dose.   IF YOU ARE TRANSFERRED TO A SKILLED REHAB FACILITY If the patient is transferred to a skilled rehab facility following release from the  hospital, a list of the current medications will be sent to the facility for the patient to continue.  When discharged from the skilled rehab facility, please have the facility set up the patient's Home Health Physical Therapy prior to being released. Also, the skilled facility will be responsible for providing the patient with their medications at time of release from the facility to include their pain medication, the muscle relaxants, and their blood thinner medication. If the patient is still at the rehab facility   at time of the two week follow up appointment, the skilled rehab facility will also need to assist the patient in arranging follow up appointment in our office and any transportation needs.  MAKE SURE YOU:  Understand these instructions.  Get help right away if you are not doing well or get worse.    DENTAL ANTIBIOTICS:  In most cases prophylactic antibiotics for Dental procdeures after total joint surgery are not necessary.  Exceptions are as follows:  1. History of prior total joint infection  2. Severely immunocompromised (Organ Transplant, cancer chemotherapy, Rheumatoid biologic meds such as Humera)  3. Poorly controlled diabetes (A1C &gt; 8.0, blood glucose over 200)  If you have one of these conditions, contact your surgeon for an antibiotic prescription, prior to your dental procedure.    Pick up stool softner and laxative for home use following surgery while on pain medications. Do not submerge incision under water. Please use good hand washing techniques while changing dressing each day. May shower starting three days after surgery. Please use a clean towel to pat the incision dry following showers. Continue to use ice for pain and swelling after surgery. Do not use any lotions or creams on the incision until instructed by your surgeon.  

## 2024-03-14 NOTE — Op Note (Signed)
 OPERATIVE REPORT- TOTAL HIP ARTHROPLASTY   PREOPERATIVE DIAGNOSIS: Osteoarthritis of the Left hip.   POSTOPERATIVE DIAGNOSIS: Osteoarthritis of the Left  hip.   PROCEDURE: Left total hip arthroplasty, anterior approach.   SURGEON: Dempsey Moan, MD   ASSISTANT: Roxie Mess, PA-C  ANESTHESIA:  General  ESTIMATED BLOOD LOSS:-400 mL    DRAINS: None  COMPLICATIONS: None   CONDITION: PACU - hemodynamically stable.   BRIEF CLINICAL NOTE: John [name] is a 68 y.o. male who has advanced end-  stage arthritis of their Left  hip with progressively worsening pain and  dysfunction.The patient has failed nonoperative management and presents for  total hip arthroplasty.   PROCEDURE IN DETAIL: After successful administration of spinal  anesthetic, the traction boots for the [facility] bed were placed on both  feet and the patient was placed onto the [facility] bed, boots placed into the leg  holders. The Left hip was then isolated from the perineum with plastic  drapes and prepped and draped in the usual sterile fashion. ASIS and  greater trochanter were marked and a oblique incision was made, starting  at about 1 cm lateral and 2 cm distal to the ASIS and coursing towards  the anterior cortex of the femur. The skin was cut with a 10 blade  through subcutaneous tissue to the level of the fascia overlying the  tensor fascia lata muscle. The fascia was then incised in line with the  incision at the junction of the anterior third and posterior 2/3rd. The  muscle was teased off the fascia and then the interval between the TFL  and the rectus was developed. The Hohmann retractor was then placed at  the top of the femoral neck over the capsule. The vessels overlying the  capsule were cauterized and the fat on top of the capsule was removed.  A Hohmann retractor was then placed anterior underneath the rectus  femoris to give exposure to the entire anterior capsule. A T-shaped  capsulotomy  was performed. The edges were tagged and the femoral head  was identified.       Osteophytes are removed off the superior acetabulum.  The femoral neck was then cut in situ with an oscillating saw. Traction  was then applied to the left lower extremity utilizing the [facility]  traction. The femoral head was then removed. Retractors were placed  around the acetabulum and then circumferential removal of the labrum was  performed. Osteophytes were also removed. Reaming starts at 51 mm to  medialize and  Increased in 2 mm increments to 55 mm. We reamed in  approximately 40 degrees of abduction, 20 degrees anteversion. A 56 mm  pinnacle acetabular shell was then impacted in anatomic position under  fluoroscopic guidance with excellent purchase. We did not need to place  any additional dome screws. A 36 mm neutral + 4 marathon liner was then  placed into the acetabular shell.       The femoral lift was then placed along the lateral aspect of the femur  just distal to the vastus ridge. The leg was  externally rotated and capsule  was stripped off the inferior aspect of the femoral neck down to the  level of the lesser trochanter, this was done with electrocautery. The femur was lifted after this was performed. The  leg was then placed in an extended and adducted position essentially delivering the femur. We also removed the capsule superiorly and the piriformis from the piriformis fossa to  gain excellent exposure of the  proximal femur. Rongeur was used to remove some cancellous bone to get  into the lateral portion of the proximal femur for placement of the  initial starter reamer. The starter broaches was placed  the starter broach  and was shown to go down the center of the canal. Broaching  with the Actis system was then performed starting at size 0  coursing  Up to size 6. A size 6 had excellent torsional and rotational  and axial stability. The trial standard offset neck was then placed  with a 36  + 1.5 trial head. The hip was then reduced. We confirmed that  the stem was in the canal both on AP and lateral x-rays. It also has excellent sizing. The hip was reduced with outstanding stability through full extension and full external rotation.. AP pelvis was taken and the leg lengths were measured and found to be equal. Hip was then dislocated again and the femoral head and neck removed. The  femoral broach was removed. Size 6 Actis stem with a standard offset  neck was then impacted into the femur following native anteversion. Has  excellent purchase in the canal. Excellent torsional and rotational and  axial stability. It is confirmed to be in the canal on AP and lateral  fluoroscopic views. The 36 + 1.5 ceramic head was placed and the hip  reduced with outstanding stability. Again AP pelvis was taken and it  confirmed that the leg lengths were equal. The wound was then copiously  irrigated with saline solution and the capsule reattached and repaired  with Ethibond suture. 30 ml of .25% Bupivicaine was  injected into the capsule and into the edge of the tensor fascia lata as well as subcutaneous tissue. The fascia overlying the tensor fascia lata was then closed with a running #1 V-Loc. Subcu was closed with interrupted 2-0 Vicryl and subcuticular running 4-0 Monocryl. Incision was cleaned  and dried. Steri-Strips and a bulky sterile dressing applied. The patient was awakened and transported to  recovery in stable condition.        Please note that a surgical assistant was a medical necessity for this procedure to perform it in a safe and expeditious manner. Assistant was necessary to provide appropriate retraction of vital neurovascular structures and to prevent femoral fracture and allow for anatomic placement of the prosthesis.  Dempsey Moan, M.D.

## 2024-03-14 NOTE — Anesthesia Postprocedure Evaluation (Signed)
 Anesthesia Post Note  Patient: John [name]  Procedure(s) Performed: ARTHROPLASTY, HIP, TOTAL, ANTERIOR APPROACH (Left: Hip)     Patient location during evaluation: PACU Anesthesia Type: General Level of consciousness: sedated and patient cooperative Pain management: pain level controlled Vital Signs Assessment: post-procedure vital signs reviewed and stable Respiratory status: spontaneous breathing Cardiovascular status: stable Anesthetic complications: no   No notable events documented.  Last Vitals:  Vitals:   03/14/24 1214 03/14/24 1457  BP: (!) 101/55 100/61  Pulse: (!) 52 (!) 55  Resp: 14 16  Temp: (!) 36.3 C 36.7 C  SpO2: 92% 93%    Last Pain:  Vitals:   03/14/24 1145  TempSrc:   PainSc: Asleep                 Norleen Pope

## 2024-03-14 NOTE — Anesthesia Procedure Notes (Signed)
 Procedure Name: Intubation Date/Time: 03/14/2024 8:35 AM  Performed by: Augusta Daved SAILOR, CRNAPre-anesthesia Checklist: Patient identified, Emergency Drugs available, Suction available and Patient being monitored Patient Re-evaluated:Patient Re-evaluated prior to induction Oxygen Delivery Method: Circle System Utilized Preoxygenation: Pre-oxygenation with 100% oxygen Induction Type: IV induction Ventilation: Mask ventilation without difficulty and Oral airway inserted - appropriate to patient size Laryngoscope Size: Glidescope and 3 Grade View: Grade I Tube type: Oral Tube size: 7.5 mm Number of attempts: 1 Airway Equipment and Method: Stylet and Oral airway Placement Confirmation: ETT inserted through vocal cords under direct vision, positive ETCO2 and breath sounds checked- equal and bilateral Secured at: 23 (at the lip) cm Tube secured with: Tape Dental Injury: Teeth and Oropharynx as per pre-operative assessment

## 2024-03-14 NOTE — Evaluation (Signed)
 Physical Therapy Evaluation Patient Details Name: John [name] MRN: [id number] DOB: 10-Apr-1956 Today's Date: 03/14/2024  History of Present Illness  Pt is 68 yo male s/p L anterior THA on 03/14/24.  Pt with hx including but not limited to arthritis, OSA, cerebral vascular disease, memory loss, HTN  Clinical Impression  Pt is s/p THA resulting in the deficits listed below (see PT Problem List). At baseline, pt is independent other than donning socks/shoes.  He is ambulatory without AD in home and RW in community.  Pt has DME and home support with only 1 step to enter home.  Today, pt ambulating 20' in room with RW , CGA, and cues.  Expected to progress well.  Was needing O2 today to maintain sats >90% - did note general anesthesia and hx of OSA.  Pt will benefit from acute skilled PT to increase their independence and safety with mobility to facilitate discharge.          If plan is discharge home, recommend the following: A little help with walking and/or transfers;A little help with bathing/dressing/bathroom;Assistance with cooking/housework;Help with stairs or ramp for entrance   Can travel by private vehicle        Equipment Recommendations None recommended by PT  Recommendations for Other Services       Functional Status Assessment Patient has had a recent decline in their functional status and demonstrates the ability to make significant improvements in function in a reasonable and predictable amount of time.     Precautions / Restrictions Precautions Precautions: Fall Restrictions Weight Bearing Restrictions Per Provider Order: Yes LLE Weight Bearing Per Provider Order: Weight bearing as tolerated      Mobility  Bed Mobility Overal bed mobility: Needs Assistance Bed Mobility: Supine to Sit     Supine to sit: Min assist, Used rails, HOB elevated          Transfers Overall transfer level: Needs assistance Equipment used: Rolling walker (2 wheels) Transfers: Sit  to/from Stand Sit to Stand: Contact guard assist                Ambulation/Gait Ambulation/Gait assistance: Contact guard assist Gait Distance (Feet): 20 Feet Assistive device: Rolling walker (2 wheels) Gait Pattern/deviations: Step-to pattern, Decreased stride length, Decreased weight shift to left Gait velocity: decreased     General Gait Details: prefers step to R pattern; min cues for RW proximity particularly with turns  Careers Information Officer     Tilt Bed    Modified Rankin (Stroke Patients Only)       Balance Overall balance assessment: Needs assistance Sitting-balance support: No upper extremity supported Sitting balance-Leahy Scale: Good     Standing balance support: Bilateral upper extremity supported, Reliant on assistive device for balance Standing balance-Leahy Scale: Poor                               Pertinent Vitals/Pain Pain Assessment Pain Assessment: No/denies pain    Home Living Family/patient expects to be discharged to:: Private residence Living Arrangements: Spouse/significant other Available Help at Discharge: Family;Available 24 hours/day Type of Home: House Home Access: Stairs to enter Entrance Stairs-Rails: None Entrance Stairs-Number of Steps: 1 threshold   Home Layout: One level Home Equipment: Pharmacist, Hospital (2 wheels);Rollator (4 wheels)      Prior Function Prior Level of Function : Needs assist;Driving  Mobility Comments: Ambulating in home wihtout AD; uses RW in community ADLs Comments: Some difficulty with lower body adls at times b/c of hip pain     Extremity/Trunk Assessment   Upper Extremity Assessment Upper Extremity Assessment: Overall WFL for tasks assessed    Lower Extremity Assessment Lower Extremity Assessment: LLE deficits/detail;RLE deficits/detail RLE Deficits / Details: ROM WFL; MMT 5/5 LLE Deficits / Details: Expected post op changes;  ROM WFL; MMT: ankle 5/5, knee ext 3/5, hip 1/5    Cervical / Trunk Assessment Cervical / Trunk Assessment: Normal  Communication        Cognition Arousal: Alert Behavior During Therapy: WFL for tasks assessed/performed   PT - Cognitive impairments: No apparent impairments                                 Cueing       General Comments General comments (skin integrity, edema, etc.): Pt on 2 L O2 with sats 93%.  Ambulated 20' in room on RA down to 88%.  Back on 2 L end of session sats 94%.  Encouraged to do ankle pumps every hour.  Educated on WBAT and no hip precautions    Exercises     Assessment/Plan    PT Assessment Patient needs continued PT services  PT Problem List Decreased strength;Decreased mobility;Decreased range of motion;Decreased activity tolerance;Decreased balance;Decreased knowledge of use of DME       PT Treatment Interventions DME instruction;Therapeutic exercise;Gait training;Stair training;Functional mobility training;Therapeutic activities;Patient/family education;Modalities;Balance training    PT Goals (Current goals can be found in the Care Plan section)  Acute Rehab PT Goals Patient Stated Goal: return home PT Goal Formulation: With patient/family Time For Goal Achievement: 03/28/24 Potential to Achieve Goals: Good    Frequency 7X/week     Co-evaluation               AM-PAC PT 6 Clicks Mobility  Outcome Measure Help needed turning from your back to your side while in a flat bed without using bedrails?: A Little Help needed moving from lying on your back to sitting on the side of a flat bed without using bedrails?: A Little Help needed moving to and from a bed to a chair (including a wheelchair)?: A Little Help needed standing up from a chair using your arms (e.g., wheelchair or bedside chair)?: A Little Help needed to walk in hospital room?: A Little Help needed climbing 3-5 steps with a railing? : A Little 6 Click  Score: 18    End of Session Equipment Utilized During Treatment: Gait belt Activity Tolerance: Patient tolerated treatment well Patient left: with chair alarm set;in chair;with call bell/phone within reach;with family/visitor present (no scd and feet down to eat - encouraged to elevate after eating) Nurse Communication: Mobility status PT Visit Diagnosis: Other abnormalities of gait and mobility (R26.89);Muscle weakness (generalized) (M62.81)    Time: 8340-8276 PT Time Calculation (min) (ACUTE ONLY): 24 min   Charges:   PT Evaluation $PT Eval Low Complexity: 1 Low PT Treatments $Gait Training: 8-22 mins PT General Charges $$ ACUTE PT VISIT: 1 Visit         Benjiman, PT Acute Rehab [facility] [phone]   Benjiman VEAR Mulberry 03/14/2024, 5:29 PM

## 2024-03-14 NOTE — Care Plan (Signed)
 Ortho Bundle Case Management Note  Patient Details  Name: John [name] MRN: [id number] Date of Birth: 1956-01-14  L THA on 03/14/24  DCP: Home with wife  DME: No needs  PT: HEP                   DME Arranged:  N/A DME Agency:  NA  HH Arranged:    HH Agency:     Additional Comments: Please contact me with any questions of if this plan should need to change.  Lyle Pepper, CCM [facility] [phone]  Ext. [phone]   03/14/2024, 8:22 AM

## 2024-03-14 NOTE — Transfer of Care (Signed)
 Immediate Anesthesia Transfer of Care Note  Patient: John [name]  Procedure(s) Performed: ARTHROPLASTY, HIP, TOTAL, ANTERIOR APPROACH (Left: Hip)  Patient Location: PACU  Anesthesia Type:General  Level of Consciousness: drowsy and patient cooperative  Airway & Oxygen Therapy: Patient Spontanous Breathing and Patient connected to face mask oxygen  Post-op Assessment: Report given to RN and Post -op Vital signs reviewed and stable  Post vital signs: Reviewed and stable  Last Vitals:  Vitals Value Taken Time  BP 105/55 03/14/2024 10:09  Temp    Pulse 55 03/14/2024 10:09  Resp 18 03/14/2024 10:09  SpO2 97% 03/14/2024 10:09    Last Pain:  Vitals:   03/14/24 0655  TempSrc: Oral         Complications: No notable events documented.

## 2024-03-14 NOTE — Interval H&P Note (Signed)
 History and Physical Interval Note:  03/14/2024 7:45 AM  John [name]  has presented today for surgery, with the diagnosis of Left hip osteoarthritis.  The various methods of treatment have been discussed with the patient and family. After consideration of risks, benefits and other options for treatment, the patient has consented to  Procedure(s): ARTHROPLASTY, HIP, TOTAL, ANTERIOR APPROACH (Left) as a surgical intervention.  The patient's history has been reviewed, patient examined, no change in status, stable for surgery.  I have reviewed the patient's chart and labs.  Questions were answered to the patient's satisfaction.     [name]

## 2024-03-15 ENCOUNTER — Encounter (HOSPITAL_COMMUNITY): Payer: Self-pay | Admitting: Orthopedic Surgery

## 2024-03-15 ENCOUNTER — Other Ambulatory Visit (HOSPITAL_COMMUNITY): Payer: Self-pay

## 2024-03-15 DIAGNOSIS — M1612 Unilateral primary osteoarthritis, left hip: Secondary | ICD-10-CM | POA: Diagnosis not present

## 2024-03-15 DIAGNOSIS — M25752 Osteophyte, left hip: Secondary | ICD-10-CM | POA: Diagnosis not present

## 2024-03-15 DIAGNOSIS — E66813 Obesity, class 3: Secondary | ICD-10-CM | POA: Diagnosis not present

## 2024-03-15 DIAGNOSIS — I10 Essential (primary) hypertension: Secondary | ICD-10-CM | POA: Diagnosis not present

## 2024-03-15 DIAGNOSIS — G4733 Obstructive sleep apnea (adult) (pediatric): Secondary | ICD-10-CM | POA: Diagnosis not present

## 2024-03-15 DIAGNOSIS — I499 Cardiac arrhythmia, unspecified: Secondary | ICD-10-CM | POA: Diagnosis not present

## 2024-03-15 LAB — BASIC METABOLIC PANEL WITH GFR
Anion gap: 8 (ref 5–15)
BUN: 17 mg/dL (ref 8–23)
CO2: 25 mmol/L (ref 22–32)
Calcium: 8.5 mg/dL — ABNORMAL LOW (ref 8.9–10.3)
Chloride: 104 mmol/L (ref 98–111)
Creatinine, Ser: 1.18 mg/dL (ref 0.61–1.24)
GFR, Estimated: >60 mL/min (ref >60)
Glucose, Bld: 117 mg/dL — ABNORMAL HIGH (ref 70–99)
Potassium: 3.8 mmol/L (ref 3.5–5.1)
Sodium: 137 mmol/L (ref 135–145)

## 2024-03-15 LAB — CBC
HCT: 29.6 % — ABNORMAL LOW (ref 39.0–52.0)
Hemoglobin: 9.7 g/dL — ABNORMAL LOW (ref 13.0–17.0)
MCH: 30.1 pg (ref 26.0–34.0)
MCHC: 32.8 g/dL (ref 30.0–36.0)
MCV: 91.9 fL (ref 80.0–100.0)
Platelets: 155 K/uL (ref 150–400)
RBC: 3.22 MIL/uL — ABNORMAL LOW (ref 4.22–5.81)
RDW: 12.6 % (ref 11.5–15.5)
WBC: 7.8 K/uL (ref 4.0–10.5)
nRBC: 0 % (ref 0.0–0.2)

## 2024-03-15 MED ORDER — OXYCODONE HCL 5 MG PO TABS
5.0000 mg | ORAL_TABLET | ORAL | 0 refills | Status: AC | PRN
  Filled 2024-03-15: qty 42, 7d supply, fill #0

## 2024-03-15 MED ORDER — ASPIRIN 81 MG PO CHEW
81.0000 mg | CHEWABLE_TABLET | Freq: Two times a day (BID) | ORAL | 0 refills | Status: AC
  Filled 2024-03-15: qty 63, 32d supply, fill #0

## 2024-03-15 MED ORDER — ONDANSETRON HCL 4 MG PO TABS
4.0000 mg | ORAL_TABLET | Freq: Four times a day (QID) | ORAL | 0 refills | Status: AC | PRN
  Filled 2024-03-15: qty 20, 5d supply, fill #0

## 2024-03-15 MED ORDER — METHOCARBAMOL 500 MG PO TABS
500.0000 mg | ORAL_TABLET | Freq: Four times a day (QID) | ORAL | 0 refills | Status: AC | PRN
  Filled 2024-03-15: qty 40, 10d supply, fill #0

## 2024-03-15 NOTE — Progress Notes (Signed)
   Subjective: 1 Day Post-Op Procedure(s) (LRB): ARTHROPLASTY, HIP, TOTAL, ANTERIOR APPROACH (Left) Patient reports pain as mild.   Patient seen in rounds by Dr. Melodi. Patient is well, and has had no acute complaints or problems. Denies chest pain or SOB. No issues overnight.  We will continue therapy today, ambulated 20' yesterday.   Objective: Vital signs in last 24 hours: Temp:  [97.4 F (36.3 C)-99.5 F (37.5 C)] 99 F (37.2 C) (12/04 0602) Pulse Rate:  [51-72] 66 (12/04 0602) Resp:  [10-18] 16 (12/04 0602) BP: (100-134)/(52-85) 121/58 (12/04 0602) SpO2:  [91 %-97 %] 92 % (12/04 0602)  Intake/Output from previous day:  Intake/Output Summary (Last 24 hours) at 03/15/2024 0754 Last data filed at 03/15/2024 0602 Gross per 24 hour  Intake 3850.85 ml  Output 1500 ml  Net 2350.85 ml     Intake/Output this shift: No intake/output data recorded.  Labs: Recent Labs    03/15/24 0353  HGB 9.7*   Recent Labs    03/15/24 0353  WBC 7.8  RBC 3.22*  HCT 29.6*  PLT 155   Recent Labs    03/14/24 0726 03/15/24 0353  NA 144 137  K 3.4* 3.8  CL 108 104  CO2 25 25  BUN 13 17  CREATININE 1.15 1.18  GLUCOSE 111* 117*  CALCIUM  9.0 8.5*   No results for input(s): LABPT, INR in the last 72 hours.  Exam: General - Patient is Alert and Oriented Extremity - Neurologically intact Neurovascular intact Sensation intact distally Dorsiflexion/Plantar flexion intact Dressing - dressing C/D/I Motor Function - intact, moving foot and toes well on exam.   Past Medical History:  Diagnosis Date   Depression    History of kidney stones    Hyperlipemia    Hypertension    Memory loss    Pneumonia    Psoriatic arthritis (HCC)    Sleep apnea     Assessment/Plan: 1 Day Post-Op Procedure(s) (LRB): ARTHROPLASTY, HIP, TOTAL, ANTERIOR APPROACH (Left) Principal Problem:   OA (osteoarthritis) of hip  Estimated body mass index is 35.43 kg/m as calculated from the  following:   Height as of this encounter: 5' 8 (1.727 m).   Weight as of this encounter: 105.7 kg. Advance diet Up with therapy D/C IV fluids  DVT Prophylaxis - Aspirin Weight bearing as tolerated. Continue therapy.  Plan is to go Home after hospital stay. Plan for discharge with HEP later today if progresses with therapy and meeting goals. Follow-up in the office in 2 weeks.  The PDMP database was reviewed today prior to any opioid medications being prescribed to this patient.  Roxie Mess, PA-C Orthopedic Surgery [phone] 03/15/2024, 7:54 AM

## 2024-03-15 NOTE — Progress Notes (Signed)
 Physical Therapy Treatment Patient Details Name: John [name] MRN: [id number] DOB: 1955-06-22 Today's Date: 03/15/2024   History of Present Illness Pt is 68 yo male s/p L anterior THA on 03/14/24.  Pt with hx including but not limited to arthritis, OSA, cerebral vascular disease, memory loss, HTN    PT Comments  POD # 1 pm session Pt was in bathroom.  Assisted with toilet transfer and instructed on bathroom safety.  Assisted with dressing.  Assisted with amb.  Then returned to room to perform some TE's following HEP handout.  Instructed on proper tech, freq as well as use of ICE.   Addressed all mobility questions, discussed appropriate activity, educated on use of ICE.  Pt ready for D/C to home. Pt plans to return in about 3 months for the other hip to be replaced.     If plan is discharge home, recommend the following: A little help with walking and/or transfers;A little help with bathing/dressing/bathroom;Assistance with cooking/housework;Help with stairs or ramp for entrance   Can travel by private vehicle        Equipment Recommendations  None recommended by PT    Recommendations for Other Services       Precautions / Restrictions Precautions Precautions: Fall Restrictions Weight Bearing Restrictions Per Provider Order: No LLE Weight Bearing Per Provider Order: Weight bearing as tolerated     Mobility  Bed Mobility Overal bed mobility: Needs Assistance Bed Mobility: Supine to Sit     Supine to sit: Contact guard, Min assist     General bed mobility comments: Instructed how to use belt to self assist LE    Transfers Overall transfer level: Needs assistance Equipment used: Rolling walker (2 wheels) Transfers: Sit to/from Stand Sit to Stand: Supervision, Contact guard assist           General transfer comment: VC's on safety with turns    Ambulation/Gait Ambulation/Gait assistance: Supervision Gait Distance (Feet): 28 Feet Assistive device: Rolling  walker (2 wheels)   Gait velocity: decreased     General Gait Details: distance limited by pain 8/10.  VC's on proper walker to self distance and safety with turns   Optometrist     Tilt Bed    Modified Rankin (Stroke Patients Only)       Balance                                            Communication Communication Communication: No apparent difficulties  Cognition Arousal: Alert Behavior During Therapy: WFL for tasks assessed/performed   PT - Cognitive impairments: No apparent impairments                       PT - Cognition Comments: AxO x 3 pleasant and motivated Following commands: Intact      Cueing Cueing Techniques: Verbal cues  Exercises  Total Hip Replacement TE's following HEP Handout 10 reps ankle pumps 05 reps knee presses 05 reps heel slides 05 reps SAQ's 05 reps ABD Instructed how to use a belt loop to assist  Followed by ICE     General Comments        Pertinent Vitals/Pain Pain Assessment Pain Assessment: 0-10 Pain Score: 7  Pain Location: L hip with activity Pain Descriptors / Indicators: Aching, Discomfort, Operative  site guarding Pain Intervention(s): Monitored during session, Premedicated before session, Repositioned, Ice applied    Home Living                          Prior Function            PT Goals (current goals can now be found in the care plan section) Progress towards PT goals: Progressing toward goals    Frequency    7X/week      PT Plan      Co-evaluation              AM-PAC PT 6 Clicks Mobility   Outcome Measure  Help needed turning from your back to your side while in a flat bed without using bedrails?: A Little Help needed moving from lying on your back to sitting on the side of a flat bed without using bedrails?: A Little Help needed moving to and from a bed to a chair (including a wheelchair)?: A Little Help needed  standing up from a chair using your arms (e.g., wheelchair or bedside chair)?: A Little Help needed to walk in hospital room?: A Little Help needed climbing 3-5 steps with a railing? : A Little 6 Click Score: 18    End of Session Equipment Utilized During Treatment: Gait belt Activity Tolerance: Patient tolerated treatment well Patient left: in chair;with call bell/phone within reach;with family/visitor present Nurse Communication: Mobility status PT Visit Diagnosis: Other abnormalities of gait and mobility (R26.89);Muscle weakness (generalized) (M62.81)     Time: 1340-1411 PT Time Calculation (min) (ACUTE ONLY): 31 min  Charges:    $Gait Training: 8-22 mins $Therapeutic Exercise: 8-22 mins PT General Charges $$ ACUTE PT VISIT: 1 Visit                    Katheryn Leap  PTA Acute  Rehabilitation Services Office M-F          [phone]

## 2024-03-15 NOTE — TOC Transition Note (Signed)
 Transition of Care [facility]) - Discharge Note   Patient Details  Name: John [name] MRN: [id number] Date of Birth: [birth year]  [facility] CM/SW Contact:  Alfonse JONELLE Rex, RN Phone Number: 03/15/2024, 12:49 PM   Clinical Narrative:  Admitted for scheduled procedure, dc therapy HEP, has RW. No INPT CM needs.      Final next level of care: Home/Self Care Barriers to Discharge: No Barriers Identified   Patient Goals and CMS Choice Patient states their goals for this hospitalization and ongoing recovery are:: return home          Discharge Placement                       Discharge Plan and Services Additional resources added to the After Visit Summary for                  DME Arranged: N/A DME Agency: NA                  Social Drivers of Health (SDOH) Interventions SDOH Screenings   Food Insecurity: No Food Insecurity (03/14/2024)  Housing: Low Risk  (03/14/2024)  Transportation Needs: No Transportation Needs (03/14/2024)  Utilities: Not At Risk (03/14/2024)  Social Connections: Moderately Isolated (03/14/2024)  Tobacco Use: Medium Risk (03/14/2024)     Readmission Risk Interventions     No data to display

## 2024-03-15 NOTE — Plan of Care (Signed)
  Problem: Activity: Goal: Risk for activity intolerance will decrease Outcome: Progressing   Problem: Nutrition: Goal: Adequate nutrition will be maintained Outcome: Progressing   Problem: Safety: Goal: Ability to remain free from injury will improve Outcome: Progressing   Problem: Pain Managment: Goal: General experience of comfort will improve and/or be controlled Outcome: Progressing

## 2024-03-15 NOTE — Progress Notes (Signed)
 Discharge medications delivered to patient at the bedside.

## 2024-03-15 NOTE — Progress Notes (Signed)
 Physical Therapy Treatment Patient Details Name: John [name] MRN: [id number] DOB: 19-Apr-1955 Today's Date: 03/15/2024   History of Present Illness Pt is 68 yo male s/p L anterior THA on 03/14/24.  Pt with hx including but not limited to arthritis, OSA, cerebral vascular disease, memory loss, HTN    PT Comments  POD # 1 am session AxO x 3 pleasant and motivated.  Supportive Spouse in room.  General bed mobility comments: Instructed how to use belt to self assist LE OOB.  Assisted with amb.  General Gait Details: distance limited by pain 8/10.  VC's on proper walker to self distance and safety with turns.  Discussed activity level and use of ICE.  Issued a handout copy HEP.  Will perform this afternoon due to current pain level.  Educated on pain management. Pt plans to D/C to home later today after second PT session.    If plan is discharge home, recommend the following: A little help with walking and/or transfers;A little help with bathing/dressing/bathroom;Assistance with cooking/housework;Help with stairs or ramp for entrance   Can travel by private vehicle        Equipment Recommendations  None recommended by PT    Recommendations for Other Services       Precautions / Restrictions Precautions Precautions: Fall Restrictions Weight Bearing Restrictions Per Provider Order: No LLE Weight Bearing Per Provider Order: Weight bearing as tolerated     Mobility  Bed Mobility Overal bed mobility: Needs Assistance Bed Mobility: Supine to Sit     Supine to sit: Contact guard, Min assist     General bed mobility comments: Instructed how to use belt to self assist LE    Transfers Overall transfer level: Needs assistance Equipment used: Rolling walker (2 wheels) Transfers: Sit to/from Stand Sit to Stand: Supervision, Contact guard assist           General transfer comment: VC's on safety with turns    Ambulation/Gait Ambulation/Gait assistance: Supervision Gait  Distance (Feet): 28 Feet Assistive device: Rolling walker (2 wheels)   Gait velocity: decreased     General Gait Details: distance limited by pain 8/10.  VC's on proper walker to self distance and safety with turns   Optometrist     Tilt Bed    Modified Rankin (Stroke Patients Only)       Balance                                            Communication Communication Communication: No apparent difficulties  Cognition Arousal: Alert Behavior During Therapy: WFL for tasks assessed/performed   PT - Cognitive impairments: No apparent impairments                       PT - Cognition Comments: AxO x 3 pleasant and motivated Following commands: Intact      Cueing Cueing Techniques: Verbal cues  Exercises      General Comments        Pertinent Vitals/Pain Pain Assessment Pain Assessment: 0-10 Pain Score: 7  Pain Location: L hip with activity Pain Descriptors / Indicators: Aching, Discomfort, Operative site guarding Pain Intervention(s): Monitored during session, Premedicated before session, Repositioned, Ice applied    Home Living  Prior Function            PT Goals (current goals can now be found in the care plan section) Progress towards PT goals: Progressing toward goals    Frequency    7X/week      PT Plan      Co-evaluation              AM-PAC PT 6 Clicks Mobility   Outcome Measure  Help needed turning from your back to your side while in a flat bed without using bedrails?: A Little Help needed moving from lying on your back to sitting on the side of a flat bed without using bedrails?: A Little Help needed moving to and from a bed to a chair (including a wheelchair)?: A Little Help needed standing up from a chair using your arms (e.g., wheelchair or bedside chair)?: A Little Help needed to walk in hospital room?: A Little Help needed  climbing 3-5 steps with a railing? : A Little 6 Click Score: 18    End of Session Equipment Utilized During Treatment: Gait belt Activity Tolerance: Patient tolerated treatment well Patient left: in chair;with call bell/phone within reach;with family/visitor present Nurse Communication: Mobility status PT Visit Diagnosis: Other abnormalities of gait and mobility (R26.89);Muscle weakness (generalized) (M62.81)     Time: 9073-9048 PT Time Calculation (min) (ACUTE ONLY): 25 min  Charges:    $Gait Training: 8-22 mins $Therapeutic Activity: 8-22 mins PT General Charges $$ ACUTE PT VISIT: 1 Visit                    Katheryn Leap  PTA Acute  Rehabilitation Services Office M-F          [phone]

## 2024-03-15 NOTE — Progress Notes (Signed)
   03/15/24 0104  BiPAP/CPAP/SIPAP  $ Non-Invasive Ventilator  Non-Invasive Vent Initial  BiPAP/CPAP/SIPAP Pt Type Adult  BiPAP/CPAP/SIPAP DREAMSTATIOND  Mask Type Nasal pillows  Dentures removed? Not applicable  Mask Size Medium  Patient Home Machine No  Patient Home Mask Yes  Patient Home Tubing Yes  Auto Titrate Yes  Device Plugged into RED Power Outlet Yes  BiPAP/CPAP /SiPAP Vitals  SpO2 97 %  Bilateral Breath Sounds Diminished;Clear

## 2024-05-30 ENCOUNTER — Encounter: Admitting: Vascular Surgery

## 2024-05-30 ENCOUNTER — Ambulatory Visit (HOSPITAL_COMMUNITY)

## 2024-06-13 ENCOUNTER — Ambulatory Visit (HOSPITAL_COMMUNITY): Admit: 2024-06-13 | Admitting: Orthopedic Surgery
